# Patient Record
Sex: Male | Born: 1970
Health system: Southern US, Community
[De-identification: ages and names within clinical notes are randomized; demographics above are authoritative.]

## PROBLEM LIST (undated history)

## (undated) DIAGNOSIS — F419 Anxiety disorder, unspecified: Secondary | ICD-10-CM

## (undated) DIAGNOSIS — R61 Generalized hyperhidrosis: Secondary | ICD-10-CM

## (undated) DIAGNOSIS — F909 Attention-deficit hyperactivity disorder, unspecified type: Secondary | ICD-10-CM

## (undated) DIAGNOSIS — K2210 Ulcer of esophagus without bleeding: Secondary | ICD-10-CM

## (undated) DIAGNOSIS — M329 Systemic lupus erythematosus, unspecified: Secondary | ICD-10-CM

## (undated) DIAGNOSIS — M545 Low back pain, unspecified: Secondary | ICD-10-CM

## (undated) DIAGNOSIS — J8410 Pulmonary fibrosis, unspecified: Secondary | ICD-10-CM

## (undated) DIAGNOSIS — G4700 Insomnia, unspecified: Secondary | ICD-10-CM

## (undated) DIAGNOSIS — M8700 Idiopathic aseptic necrosis of unspecified bone: Secondary | ICD-10-CM

## (undated) DIAGNOSIS — F329 Major depressive disorder, single episode, unspecified: Secondary | ICD-10-CM

## (undated) DIAGNOSIS — F32A Depression, unspecified: Secondary | ICD-10-CM

## (undated) DIAGNOSIS — M3500 Sicca syndrome, unspecified: Secondary | ICD-10-CM

## (undated) DIAGNOSIS — J45909 Unspecified asthma, uncomplicated: Secondary | ICD-10-CM

## (undated) DIAGNOSIS — M898X9 Other specified disorders of bone, unspecified site: Secondary | ICD-10-CM

## (undated) DIAGNOSIS — K219 Gastro-esophageal reflux disease without esophagitis: Secondary | ICD-10-CM

## (undated) DIAGNOSIS — N189 Chronic kidney disease, unspecified: Secondary | ICD-10-CM

## (undated) DIAGNOSIS — I319 Disease of pericardium, unspecified: Secondary | ICD-10-CM

## (undated) DIAGNOSIS — I2699 Other pulmonary embolism without acute cor pulmonale: Secondary | ICD-10-CM

## (undated) DIAGNOSIS — I7300 Raynaud's syndrome without gangrene: Secondary | ICD-10-CM

## (undated) DIAGNOSIS — G4730 Sleep apnea, unspecified: Secondary | ICD-10-CM

## (undated) DIAGNOSIS — K449 Diaphragmatic hernia without obstruction or gangrene: Secondary | ICD-10-CM

## (undated) HISTORY — DX: Insomnia, unspecified: G47.00

## (undated) HISTORY — DX: Depression, unspecified: F32.A

## (undated) HISTORY — DX: Major depressive disorder, single episode, unspecified: F32.9

## (undated) HISTORY — DX: Low back pain, unspecified: M54.50

## (undated) HISTORY — DX: Attention-deficit hyperactivity disorder, unspecified type: F90.9

## (undated) HISTORY — DX: Generalized hyperhidrosis: R61

## (undated) HISTORY — DX: Diaphragmatic hernia without obstruction or gangrene: K44.9

## (undated) HISTORY — DX: Ulcer of esophagus without bleeding: K22.10

## (undated) HISTORY — DX: Sleep apnea, unspecified: G47.30

## (undated) HISTORY — DX: Other specified disorders of bone, unspecified site: M89.8X9

## (undated) HISTORY — DX: Gastro-esophageal reflux disease without esophagitis: K21.9

## (undated) HISTORY — PX: VARICOSE VEIN SURGERY: SHX832

## (undated) HISTORY — PX: OTHER SURGICAL HISTORY: SHX169

## (undated) HISTORY — DX: Disease of pericardium, unspecified: I31.9

## (undated) HISTORY — DX: Raynaud's syndrome without gangrene: I73.00

## (undated) HISTORY — DX: Chronic kidney disease, unspecified: N18.9

## (undated) HISTORY — DX: Anxiety disorder, unspecified: F41.9

## (undated) HISTORY — DX: Idiopathic aseptic necrosis of unspecified bone: M87.00

## (undated) HISTORY — DX: Low back pain: M54.5

## (undated) HISTORY — DX: Pulmonary fibrosis, unspecified: J84.10

## (undated) HISTORY — DX: Systemic lupus erythematosus, unspecified: M32.9

## (undated) HISTORY — DX: Sjogren syndrome, unspecified: M35.00

## (undated) HISTORY — DX: Other pulmonary embolism without acute cor pulmonale: I26.99

## (undated) HISTORY — PX: UVULOPALATOPLASTY: SHX2633

## (undated) HISTORY — DX: Unspecified asthma, uncomplicated: J45.909

---

## 1998-05-10 ENCOUNTER — Ambulatory Visit (HOSPITAL_COMMUNITY): Admission: RE | Admit: 1998-05-10 | Discharge: 1998-05-10 | Payer: Self-pay | Admitting: *Deleted

## 1998-08-21 ENCOUNTER — Emergency Department (HOSPITAL_COMMUNITY): Admission: EM | Admit: 1998-08-21 | Discharge: 1998-08-21 | Payer: Self-pay | Admitting: Emergency Medicine

## 1998-09-18 ENCOUNTER — Ambulatory Visit (HOSPITAL_COMMUNITY): Admission: RE | Admit: 1998-09-18 | Discharge: 1998-09-18 | Payer: Self-pay

## 1999-02-12 ENCOUNTER — Inpatient Hospital Stay (HOSPITAL_COMMUNITY): Admission: EM | Admit: 1999-02-12 | Discharge: 1999-03-03 | Payer: Self-pay | Admitting: *Deleted

## 1999-08-25 ENCOUNTER — Encounter: Payer: Self-pay | Admitting: Internal Medicine

## 1999-08-25 ENCOUNTER — Emergency Department (HOSPITAL_COMMUNITY): Admission: EM | Admit: 1999-08-25 | Discharge: 1999-08-25 | Payer: Self-pay | Admitting: Emergency Medicine

## 1999-09-21 ENCOUNTER — Emergency Department (HOSPITAL_COMMUNITY): Admission: EM | Admit: 1999-09-21 | Discharge: 1999-09-21 | Payer: Self-pay | Admitting: *Deleted

## 2002-05-03 ENCOUNTER — Encounter: Payer: Self-pay | Admitting: Emergency Medicine

## 2002-05-03 ENCOUNTER — Inpatient Hospital Stay (HOSPITAL_COMMUNITY): Admission: EM | Admit: 2002-05-03 | Discharge: 2002-05-06 | Payer: Self-pay | Admitting: Emergency Medicine

## 2002-05-23 ENCOUNTER — Encounter: Admission: RE | Admit: 2002-05-23 | Discharge: 2002-08-21 | Payer: Self-pay

## 2002-09-04 ENCOUNTER — Encounter: Admission: RE | Admit: 2002-09-04 | Discharge: 2002-12-03 | Payer: Self-pay | Admitting: Anesthesiology

## 2002-09-23 HISTORY — PX: OTHER SURGICAL HISTORY: SHX169

## 2002-12-07 ENCOUNTER — Encounter: Admission: RE | Admit: 2002-12-07 | Discharge: 2003-03-07 | Payer: Self-pay

## 2003-01-04 ENCOUNTER — Encounter: Payer: Self-pay | Admitting: Emergency Medicine

## 2003-01-04 ENCOUNTER — Inpatient Hospital Stay (HOSPITAL_COMMUNITY): Admission: EM | Admit: 2003-01-04 | Discharge: 2003-01-12 | Payer: Self-pay

## 2003-01-08 ENCOUNTER — Encounter: Payer: Self-pay | Admitting: Pulmonary Disease

## 2003-01-10 ENCOUNTER — Encounter: Payer: Self-pay | Admitting: Internal Medicine

## 2003-02-26 ENCOUNTER — Inpatient Hospital Stay (HOSPITAL_COMMUNITY): Admission: AD | Admit: 2003-02-26 | Discharge: 2003-02-28 | Payer: Self-pay | Admitting: Internal Medicine

## 2003-02-26 ENCOUNTER — Encounter: Payer: Self-pay | Admitting: Internal Medicine

## 2003-03-13 ENCOUNTER — Encounter
Admission: RE | Admit: 2003-03-13 | Discharge: 2003-06-11 | Payer: Self-pay | Admitting: Physical Medicine & Rehabilitation

## 2003-07-18 ENCOUNTER — Encounter
Admission: RE | Admit: 2003-07-18 | Discharge: 2003-10-16 | Payer: Self-pay | Admitting: Physical Medicine & Rehabilitation

## 2003-10-01 ENCOUNTER — Ambulatory Visit (HOSPITAL_COMMUNITY): Admission: RE | Admit: 2003-10-01 | Discharge: 2003-10-02 | Payer: Self-pay | Admitting: Orthopedic Surgery

## 2003-12-27 ENCOUNTER — Encounter: Admission: RE | Admit: 2003-12-27 | Discharge: 2003-12-27 | Payer: Self-pay | Admitting: Orthopedic Surgery

## 2004-04-14 ENCOUNTER — Encounter: Admission: RE | Admit: 2004-04-14 | Discharge: 2004-04-14 | Payer: Self-pay | Admitting: Surgery

## 2004-04-17 ENCOUNTER — Ambulatory Visit (HOSPITAL_COMMUNITY): Admission: RE | Admit: 2004-04-17 | Discharge: 2004-04-17 | Payer: Self-pay | Admitting: Surgery

## 2004-04-17 ENCOUNTER — Encounter (INDEPENDENT_AMBULATORY_CARE_PROVIDER_SITE_OTHER): Payer: Self-pay | Admitting: Specialist

## 2004-05-12 ENCOUNTER — Encounter: Admission: RE | Admit: 2004-05-12 | Discharge: 2004-05-12 | Payer: Self-pay | Admitting: Orthopedic Surgery

## 2004-09-25 ENCOUNTER — Ambulatory Visit: Payer: Self-pay | Admitting: Family Medicine

## 2004-10-01 ENCOUNTER — Ambulatory Visit: Payer: Self-pay | Admitting: Family Medicine

## 2005-01-07 ENCOUNTER — Ambulatory Visit: Payer: Self-pay | Admitting: Family Medicine

## 2005-01-14 ENCOUNTER — Ambulatory Visit: Payer: Self-pay | Admitting: Family Medicine

## 2005-03-20 ENCOUNTER — Emergency Department (HOSPITAL_COMMUNITY): Admission: EM | Admit: 2005-03-20 | Discharge: 2005-03-20 | Payer: Self-pay | Admitting: *Deleted

## 2005-04-14 ENCOUNTER — Ambulatory Visit: Payer: Self-pay | Admitting: Family Medicine

## 2005-04-14 ENCOUNTER — Encounter: Admission: RE | Admit: 2005-04-14 | Discharge: 2005-04-14 | Payer: Self-pay | Admitting: Family Medicine

## 2005-07-06 ENCOUNTER — Ambulatory Visit: Payer: Self-pay | Admitting: Family Medicine

## 2005-07-23 ENCOUNTER — Ambulatory Visit: Payer: Self-pay | Admitting: Internal Medicine

## 2005-07-24 ENCOUNTER — Ambulatory Visit: Payer: Self-pay

## 2005-08-13 ENCOUNTER — Ambulatory Visit: Payer: Self-pay | Admitting: Family Medicine

## 2005-08-26 ENCOUNTER — Ambulatory Visit: Payer: Self-pay | Admitting: Family Medicine

## 2005-09-02 ENCOUNTER — Ambulatory Visit: Payer: Self-pay | Admitting: Family Medicine

## 2005-09-09 ENCOUNTER — Ambulatory Visit: Payer: Self-pay | Admitting: Family Medicine

## 2005-09-25 ENCOUNTER — Ambulatory Visit: Payer: Self-pay | Admitting: Family Medicine

## 2005-10-21 ENCOUNTER — Ambulatory Visit: Payer: Self-pay | Admitting: Gastroenterology

## 2005-11-11 ENCOUNTER — Ambulatory Visit: Payer: Self-pay | Admitting: Family Medicine

## 2005-11-18 ENCOUNTER — Ambulatory Visit: Payer: Self-pay | Admitting: Internal Medicine

## 2005-11-18 ENCOUNTER — Emergency Department (HOSPITAL_COMMUNITY): Admission: EM | Admit: 2005-11-18 | Discharge: 2005-11-18 | Payer: Self-pay | Admitting: Family Medicine

## 2006-01-20 ENCOUNTER — Ambulatory Visit: Payer: Self-pay | Admitting: Family Medicine

## 2006-02-01 ENCOUNTER — Ambulatory Visit: Payer: Self-pay | Admitting: Family Medicine

## 2006-03-10 ENCOUNTER — Ambulatory Visit: Payer: Self-pay | Admitting: Pulmonary Disease

## 2006-03-10 ENCOUNTER — Inpatient Hospital Stay (HOSPITAL_COMMUNITY): Admission: EM | Admit: 2006-03-10 | Discharge: 2006-03-19 | Payer: Self-pay | Admitting: Emergency Medicine

## 2006-03-14 ENCOUNTER — Ambulatory Visit: Payer: Self-pay | Admitting: Internal Medicine

## 2006-03-15 ENCOUNTER — Ambulatory Visit: Payer: Self-pay | Admitting: Internal Medicine

## 2006-03-17 ENCOUNTER — Encounter (INDEPENDENT_AMBULATORY_CARE_PROVIDER_SITE_OTHER): Payer: Self-pay | Admitting: *Deleted

## 2006-03-24 ENCOUNTER — Encounter: Payer: Self-pay | Admitting: Emergency Medicine

## 2006-04-16 ENCOUNTER — Ambulatory Visit: Payer: Self-pay | Admitting: Internal Medicine

## 2006-04-20 ENCOUNTER — Ambulatory Visit: Payer: Self-pay | Admitting: Family Medicine

## 2006-04-27 ENCOUNTER — Ambulatory Visit: Payer: Self-pay | Admitting: Cardiology

## 2006-06-03 ENCOUNTER — Ambulatory Visit: Payer: Self-pay | Admitting: Family Medicine

## 2006-07-07 ENCOUNTER — Ambulatory Visit: Payer: Self-pay | Admitting: Family Medicine

## 2006-07-14 ENCOUNTER — Ambulatory Visit: Payer: Self-pay | Admitting: Family Medicine

## 2006-07-15 ENCOUNTER — Ambulatory Visit: Payer: Self-pay | Admitting: Family Medicine

## 2006-08-06 ENCOUNTER — Ambulatory Visit: Payer: Self-pay | Admitting: Family Medicine

## 2006-12-24 ENCOUNTER — Ambulatory Visit: Payer: Self-pay | Admitting: Internal Medicine

## 2006-12-29 ENCOUNTER — Ambulatory Visit: Payer: Self-pay | Admitting: Family Medicine

## 2007-01-18 ENCOUNTER — Ambulatory Visit (HOSPITAL_BASED_OUTPATIENT_CLINIC_OR_DEPARTMENT_OTHER): Admission: RE | Admit: 2007-01-18 | Discharge: 2007-01-18 | Payer: Self-pay | Admitting: Internal Medicine

## 2007-01-23 ENCOUNTER — Ambulatory Visit: Payer: Self-pay | Admitting: Internal Medicine

## 2007-01-24 ENCOUNTER — Ambulatory Visit: Payer: Self-pay | Admitting: Family Medicine

## 2007-02-17 ENCOUNTER — Ambulatory Visit: Payer: Self-pay | Admitting: Internal Medicine

## 2007-03-21 ENCOUNTER — Observation Stay (HOSPITAL_COMMUNITY): Admission: RE | Admit: 2007-03-21 | Discharge: 2007-03-22 | Payer: Self-pay | Admitting: Otolaryngology

## 2007-03-21 ENCOUNTER — Encounter (INDEPENDENT_AMBULATORY_CARE_PROVIDER_SITE_OTHER): Payer: Self-pay | Admitting: Specialist

## 2007-04-27 DIAGNOSIS — R091 Pleurisy: Secondary | ICD-10-CM

## 2007-04-27 DIAGNOSIS — M3313 Other dermatomyositis without myopathy: Secondary | ICD-10-CM

## 2007-04-27 DIAGNOSIS — I319 Disease of pericardium, unspecified: Secondary | ICD-10-CM

## 2007-04-27 DIAGNOSIS — D72819 Decreased white blood cell count, unspecified: Secondary | ICD-10-CM

## 2007-04-27 DIAGNOSIS — J8410 Pulmonary fibrosis, unspecified: Secondary | ICD-10-CM

## 2007-04-27 DIAGNOSIS — M329 Systemic lupus erythematosus, unspecified: Secondary | ICD-10-CM

## 2007-04-27 DIAGNOSIS — R61 Generalized hyperhidrosis: Secondary | ICD-10-CM

## 2007-04-27 DIAGNOSIS — Z86718 Personal history of other venous thrombosis and embolism: Secondary | ICD-10-CM

## 2007-04-27 DIAGNOSIS — M3390 Dermatopolymyositis, unspecified, organ involvement unspecified: Secondary | ICD-10-CM

## 2007-04-27 HISTORY — DX: Other dermatomyositis without myopathy: M33.13

## 2007-04-27 HISTORY — DX: Dermatopolymyositis, unspecified, organ involvement unspecified: M33.90

## 2007-04-27 HISTORY — DX: Pleurisy: R09.1

## 2007-04-27 HISTORY — DX: Decreased white blood cell count, unspecified: D72.819

## 2007-04-27 HISTORY — DX: Disease of pericardium, unspecified: I31.9

## 2007-04-27 HISTORY — DX: Pulmonary fibrosis, unspecified: J84.10

## 2007-04-27 HISTORY — DX: Systemic lupus erythematosus, unspecified: M32.9

## 2007-04-27 HISTORY — DX: Personal history of other venous thrombosis and embolism: Z86.718

## 2007-04-27 HISTORY — DX: Generalized hyperhidrosis: R61

## 2007-05-05 ENCOUNTER — Ambulatory Visit: Payer: Self-pay | Admitting: Family Medicine

## 2007-05-13 ENCOUNTER — Ambulatory Visit: Payer: Self-pay | Admitting: Family Medicine

## 2007-06-13 ENCOUNTER — Emergency Department (HOSPITAL_COMMUNITY): Admission: EM | Admit: 2007-06-13 | Discharge: 2007-06-13 | Payer: Self-pay | Admitting: Emergency Medicine

## 2007-06-13 HISTORY — PX: OTHER SURGICAL HISTORY: SHX169

## 2007-06-16 ENCOUNTER — Telehealth: Payer: Self-pay | Admitting: *Deleted

## 2007-06-30 ENCOUNTER — Ambulatory Visit: Payer: Self-pay | Admitting: Family Medicine

## 2007-06-30 DIAGNOSIS — F331 Major depressive disorder, recurrent, moderate: Secondary | ICD-10-CM | POA: Insufficient documentation

## 2007-06-30 HISTORY — DX: Major depressive disorder, recurrent, moderate: F33.1

## 2007-07-06 ENCOUNTER — Telehealth: Payer: Self-pay | Admitting: Family Medicine

## 2007-07-18 ENCOUNTER — Telehealth: Payer: Self-pay | Admitting: Family Medicine

## 2007-07-18 ENCOUNTER — Encounter: Payer: Self-pay | Admitting: Family Medicine

## 2007-07-22 ENCOUNTER — Telehealth: Payer: Self-pay | Admitting: Family Medicine

## 2007-07-22 DIAGNOSIS — M549 Dorsalgia, unspecified: Secondary | ICD-10-CM

## 2007-07-22 HISTORY — DX: Dorsalgia, unspecified: M54.9

## 2007-08-17 ENCOUNTER — Telehealth: Payer: Self-pay | Admitting: Family Medicine

## 2007-08-17 ENCOUNTER — Encounter: Admission: RE | Admit: 2007-08-17 | Discharge: 2007-08-17 | Payer: Self-pay | Admitting: Orthopaedic Surgery

## 2007-08-23 ENCOUNTER — Encounter: Payer: Self-pay | Admitting: Family Medicine

## 2007-08-30 ENCOUNTER — Telehealth: Payer: Self-pay | Admitting: Family Medicine

## 2007-09-21 ENCOUNTER — Encounter: Payer: Self-pay | Admitting: Family Medicine

## 2007-09-30 ENCOUNTER — Telehealth: Payer: Self-pay | Admitting: Family Medicine

## 2007-11-10 ENCOUNTER — Encounter: Payer: Self-pay | Admitting: Family Medicine

## 2007-12-12 ENCOUNTER — Telehealth: Payer: Self-pay | Admitting: Family Medicine

## 2007-12-28 ENCOUNTER — Encounter: Payer: Self-pay | Admitting: Family Medicine

## 2007-12-28 ENCOUNTER — Ambulatory Visit (HOSPITAL_COMMUNITY): Admission: RE | Admit: 2007-12-28 | Discharge: 2007-12-29 | Payer: Self-pay | Admitting: Otolaryngology

## 2008-02-22 ENCOUNTER — Telehealth: Payer: Self-pay | Admitting: Family Medicine

## 2008-03-23 ENCOUNTER — Encounter: Payer: Self-pay | Admitting: Family Medicine

## 2008-04-03 ENCOUNTER — Ambulatory Visit: Payer: Self-pay | Admitting: Family Medicine

## 2008-04-03 ENCOUNTER — Telehealth: Payer: Self-pay | Admitting: Family Medicine

## 2008-04-03 DIAGNOSIS — F411 Generalized anxiety disorder: Secondary | ICD-10-CM

## 2008-04-03 DIAGNOSIS — K219 Gastro-esophageal reflux disease without esophagitis: Secondary | ICD-10-CM

## 2008-04-03 DIAGNOSIS — F909 Attention-deficit hyperactivity disorder, unspecified type: Secondary | ICD-10-CM

## 2008-04-03 DIAGNOSIS — J0120 Acute ethmoidal sinusitis, unspecified: Secondary | ICD-10-CM

## 2008-04-03 HISTORY — DX: Acute ethmoidal sinusitis, unspecified: J01.20

## 2008-04-03 HISTORY — DX: Gastro-esophageal reflux disease without esophagitis: K21.9

## 2008-04-03 HISTORY — DX: Generalized anxiety disorder: F41.1

## 2008-04-03 HISTORY — DX: Attention-deficit hyperactivity disorder, unspecified type: F90.9

## 2008-04-17 ENCOUNTER — Telehealth: Payer: Self-pay | Admitting: Family Medicine

## 2008-04-21 ENCOUNTER — Encounter: Payer: Self-pay | Admitting: Family Medicine
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
## 2008-05-09 ENCOUNTER — Telehealth: Payer: Self-pay | Admitting: Family Medicine

## 2008-05-10 ENCOUNTER — Telehealth: Payer: Self-pay | Admitting: Family Medicine

## 2008-05-11 ENCOUNTER — Encounter: Payer: Self-pay | Admitting: Family Medicine

## 2008-05-23 ENCOUNTER — Telehealth: Payer: Self-pay | Admitting: Family Medicine

## 2008-08-16 ENCOUNTER — Telehealth: Payer: Self-pay | Admitting: Family Medicine

## 2008-08-24 ENCOUNTER — Ambulatory Visit: Payer: Self-pay | Admitting: Family Medicine

## 2008-09-05 ENCOUNTER — Ambulatory Visit: Payer: Self-pay | Admitting: Family Medicine

## 2008-09-06 ENCOUNTER — Telehealth: Payer: Self-pay | Admitting: Family Medicine

## 2008-09-28 ENCOUNTER — Ambulatory Visit: Payer: Self-pay | Admitting: Family Medicine

## 2008-10-23 ENCOUNTER — Telehealth: Payer: Self-pay | Admitting: Family Medicine

## 2008-10-29 ENCOUNTER — Telehealth: Payer: Self-pay | Admitting: Family Medicine

## 2008-10-30 ENCOUNTER — Ambulatory Visit: Payer: Self-pay | Admitting: Family Medicine

## 2008-11-09 ENCOUNTER — Telehealth: Payer: Self-pay | Admitting: Family Medicine

## 2009-02-05 ENCOUNTER — Telehealth: Payer: Self-pay | Admitting: Family Medicine

## 2009-02-08 ENCOUNTER — Encounter: Payer: Self-pay | Admitting: Family Medicine

## 2009-03-12 ENCOUNTER — Encounter: Payer: Self-pay | Admitting: Endocrinology

## 2009-03-26 ENCOUNTER — Ambulatory Visit: Payer: Self-pay | Admitting: Endocrinology

## 2009-03-26 DIAGNOSIS — E291 Testicular hypofunction: Secondary | ICD-10-CM

## 2009-03-26 DIAGNOSIS — E230 Hypopituitarism: Secondary | ICD-10-CM

## 2009-03-26 HISTORY — DX: Testicular hypofunction: E29.1

## 2009-03-26 HISTORY — DX: Hypopituitarism: E23.0

## 2009-03-27 LAB — CONVERTED CEMR LAB
BUN: 12 mg/dL (ref 6–23)
CO2: 34 meq/L — ABNORMAL HIGH (ref 19–32)
Calcium: 8.5 mg/dL (ref 8.4–10.5)
Chloride: 104 meq/L (ref 96–112)
Cortisol, Plasma: 1.3 ug/dL
Creatinine, Ser: 1 mg/dL (ref 0.4–1.5)
FSH: 2.5 milliintl units/mL (ref 1.4–18.1)
Free T4: 0.9 ng/dL (ref 0.6–1.6)
GFR calc non Af Amer: 88.59 mL/min (ref >60)
Glucose, Bld: 85 mg/dL (ref 70–99)
LH: 0.48 milliintl units/mL — ABNORMAL LOW (ref 1.50–9.30)
Potassium: 4.2 meq/L (ref 3.5–5.1)
Prolactin: 6.7 ng/mL
Sodium: 142 meq/L (ref 135–145)
TSH: 1.77 microintl units/mL (ref 0.35–5.50)
Testosterone: 98.5 ng/dL — ABNORMAL LOW (ref 350.00–890.00)

## 2009-04-02 ENCOUNTER — Ambulatory Visit: Payer: Self-pay | Admitting: Endocrinology

## 2009-04-10 ENCOUNTER — Telehealth (INDEPENDENT_AMBULATORY_CARE_PROVIDER_SITE_OTHER): Payer: Self-pay | Admitting: *Deleted

## 2009-04-11 ENCOUNTER — Telehealth (INDEPENDENT_AMBULATORY_CARE_PROVIDER_SITE_OTHER): Payer: Self-pay | Admitting: *Deleted

## 2009-04-11 ENCOUNTER — Telehealth: Payer: Self-pay | Admitting: Family Medicine

## 2009-04-16 ENCOUNTER — Ambulatory Visit: Payer: Self-pay | Admitting: Family Medicine

## 2009-05-03 ENCOUNTER — Telehealth: Payer: Self-pay | Admitting: Family Medicine

## 2009-05-14 ENCOUNTER — Ambulatory Visit: Payer: Self-pay | Admitting: Endocrinology

## 2009-05-14 LAB — CONVERTED CEMR LAB: Testosterone: 365.08 ng/dL (ref 350.00–890.00)

## 2009-05-24 ENCOUNTER — Telehealth: Payer: Self-pay | Admitting: Family Medicine

## 2009-05-27 ENCOUNTER — Telehealth: Payer: Self-pay | Admitting: Family Medicine

## 2009-05-28 ENCOUNTER — Ambulatory Visit: Payer: Self-pay | Admitting: Family Medicine

## 2009-05-28 DIAGNOSIS — I7300 Raynaud's syndrome without gangrene: Secondary | ICD-10-CM | POA: Insufficient documentation

## 2009-05-28 HISTORY — DX: Raynaud's syndrome without gangrene: I73.00

## 2009-06-04 ENCOUNTER — Emergency Department (HOSPITAL_BASED_OUTPATIENT_CLINIC_OR_DEPARTMENT_OTHER): Admission: EM | Admit: 2009-06-04 | Discharge: 2009-06-04 | Payer: Self-pay | Admitting: Emergency Medicine

## 2009-06-05 ENCOUNTER — Encounter: Payer: Self-pay | Admitting: Family Medicine

## 2009-06-17 ENCOUNTER — Telehealth: Payer: Self-pay | Admitting: Family Medicine

## 2009-07-09 ENCOUNTER — Ambulatory Visit: Payer: Self-pay | Admitting: Family Medicine

## 2009-08-19 ENCOUNTER — Ambulatory Visit: Payer: Self-pay | Admitting: Family Medicine

## 2009-08-24 ENCOUNTER — Ambulatory Visit: Payer: Self-pay | Admitting: Diagnostic Radiology

## 2009-08-24 ENCOUNTER — Inpatient Hospital Stay (HOSPITAL_COMMUNITY): Admission: EM | Admit: 2009-08-24 | Discharge: 2009-08-26 | Payer: Self-pay | Admitting: Emergency Medicine

## 2009-08-24 ENCOUNTER — Encounter: Payer: Self-pay | Admitting: Emergency Medicine

## 2009-08-30 ENCOUNTER — Telehealth: Payer: Self-pay | Admitting: Family Medicine

## 2009-09-11 ENCOUNTER — Telehealth: Payer: Self-pay | Admitting: Family Medicine

## 2009-10-01 ENCOUNTER — Ambulatory Visit: Payer: Self-pay | Admitting: Family Medicine

## 2009-10-04 ENCOUNTER — Ambulatory Visit: Payer: Self-pay | Admitting: Family Medicine

## 2009-10-23 ENCOUNTER — Ambulatory Visit: Payer: Self-pay | Admitting: Family Medicine

## 2009-10-25 ENCOUNTER — Telehealth: Payer: Self-pay | Admitting: Family Medicine

## 2009-10-28 ENCOUNTER — Ambulatory Visit: Payer: Self-pay | Admitting: Family Medicine

## 2009-10-29 ENCOUNTER — Telehealth: Payer: Self-pay | Admitting: Family Medicine

## 2009-10-30 ENCOUNTER — Telehealth: Payer: Self-pay | Admitting: Family Medicine

## 2009-11-08 ENCOUNTER — Ambulatory Visit: Payer: Self-pay | Admitting: Family Medicine

## 2009-12-02 ENCOUNTER — Telehealth: Payer: Self-pay | Admitting: Family Medicine

## 2009-12-25 ENCOUNTER — Ambulatory Visit: Payer: Self-pay | Admitting: Endocrinology

## 2009-12-26 ENCOUNTER — Telehealth: Payer: Self-pay | Admitting: Endocrinology

## 2009-12-26 ENCOUNTER — Ambulatory Visit: Payer: Self-pay | Admitting: Endocrinology

## 2009-12-27 ENCOUNTER — Encounter: Payer: Self-pay | Admitting: Endocrinology

## 2010-01-10 ENCOUNTER — Ambulatory Visit: Payer: Self-pay | Admitting: Family Medicine

## 2010-01-10 LAB — CONVERTED CEMR LAB
Bilirubin Urine: NEGATIVE
Blood in Urine, dipstick: NEGATIVE
Glucose, Urine, Semiquant: NEGATIVE
Ketones, urine, test strip: NEGATIVE
Nitrite: NEGATIVE
Protein, U semiquant: NEGATIVE
Specific Gravity, Urine: 1.015
Urobilinogen, UA: 0.2
WBC Urine, dipstick: NEGATIVE
pH: 5

## 2010-01-12 ENCOUNTER — Emergency Department (HOSPITAL_COMMUNITY): Admission: EM | Admit: 2010-01-12 | Discharge: 2010-01-13 | Payer: Self-pay | Admitting: Emergency Medicine

## 2010-01-13 ENCOUNTER — Telehealth: Payer: Self-pay | Admitting: Family Medicine

## 2010-01-13 ENCOUNTER — Telehealth (INDEPENDENT_AMBULATORY_CARE_PROVIDER_SITE_OTHER): Payer: Self-pay | Admitting: *Deleted

## 2010-01-22 ENCOUNTER — Ambulatory Visit: Payer: Self-pay | Admitting: Endocrinology

## 2010-02-24 ENCOUNTER — Telehealth: Payer: Self-pay | Admitting: Family Medicine

## 2010-06-13 ENCOUNTER — Encounter: Payer: Self-pay | Admitting: Family Medicine

## 2010-06-16 ENCOUNTER — Ambulatory Visit: Payer: Self-pay | Admitting: Family Medicine

## 2010-06-17 ENCOUNTER — Telehealth: Payer: Self-pay | Admitting: Family Medicine

## 2010-06-18 ENCOUNTER — Ambulatory Visit: Payer: Self-pay | Admitting: Family Medicine

## 2010-06-30 ENCOUNTER — Telehealth: Payer: Self-pay | Admitting: Family Medicine

## 2010-08-11 ENCOUNTER — Ambulatory Visit: Payer: Self-pay | Admitting: Family Medicine

## 2010-08-13 ENCOUNTER — Ambulatory Visit: Payer: Self-pay | Admitting: Family Medicine

## 2010-08-22 ENCOUNTER — Ambulatory Visit: Payer: Self-pay | Admitting: Family Medicine

## 2010-09-03 ENCOUNTER — Telehealth: Payer: Self-pay | Admitting: Family Medicine

## 2010-10-01 ENCOUNTER — Ambulatory Visit: Payer: Self-pay | Admitting: Family Medicine

## 2010-10-01 ENCOUNTER — Ambulatory Visit: Payer: Self-pay | Admitting: Cardiovascular Disease

## 2010-10-01 DIAGNOSIS — R519 Headache, unspecified: Secondary | ICD-10-CM

## 2010-10-01 DIAGNOSIS — R51 Headache: Secondary | ICD-10-CM

## 2010-10-01 HISTORY — DX: Headache, unspecified: R51.9

## 2010-10-01 HISTORY — DX: Headache: R51

## 2010-12-05 ENCOUNTER — Ambulatory Visit
Admission: RE | Admit: 2010-12-05 | Discharge: 2010-12-05 | Payer: Self-pay | Source: Home / Self Care | Attending: Family Medicine | Admitting: Family Medicine

## 2010-12-13 ENCOUNTER — Encounter: Payer: Self-pay | Admitting: Gastroenterology

## 2010-12-14 ENCOUNTER — Encounter: Payer: Self-pay | Admitting: Orthopedic Surgery

## 2010-12-23 NOTE — Assessment & Plan Note (Signed)
Summary: rosefin shot//ccm   Nurse Visit   Allergies: 1)  Sulfamethoxazole (Sulfamethoxazole) 2)  Bactrim (Sulfamethoxazole-Trimethoprim)  Medication Administration  Injection # 1:    Medication: Rocephin  250mg     Diagnosis: PNEUMONIA (ICD-486)    Route: IM    Site: RUOQ gluteus    Exp Date: 4/13    Lot #: [id number]    Mfr: [place]    Comments: 1g    Patient tolerated injection without complications    Given by: Alfred Levins, CMA (October 28, 2009 2:06 PM)  Orders Added: 1)  Rocephin  250mg  [J0696] 2)  Admin of Therapeutic Inj  intramuscular or subcutaneous [96372]  Appended Document: rosefin shot//ccm actually did Left gluteus cause pt wanted it in his left

## 2010-12-23 NOTE — Medication Information (Signed)
Summary: Prior Autho & Approved for Depo Testosterone/[facility]  Prior Autho [employer] Approved for Depo Testosterone/[facility]   Imported By: Sherian Rein 12/30/2009 11:01:41  _____________________________________________________________________  External Attachment:    Type:   Image     Comment:   External Document
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
## 2010-12-23 NOTE — Letter (Signed)
Summary: Dr. Noel Gerold note  Dr. Noel Gerold note   Imported By: Kassie Mends 08/29/2007 09:00:02  _____________________________________________________________________  External Attachment:    Type:   Image     Comment:   Dr. Noel Gerold
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
## 2010-12-23 NOTE — Assessment & Plan Note (Signed)
Summary: flu shot/jls  Flu Vaccine Consent Questions     Do you have a history of severe allergic reactions to this vaccine? no    Any prior history of allergic reactions to egg and/or gelatin? no    Do you have a sensitivity to the preservative Thimersol? no    Do you have a past history of Guillan-Barre Syndrome? no    Do you currently have an acute febrile illness? no    Have you ever had a severe reaction to latex? no    Vaccine information given and explained to patient? yes    Are you currently pregnant? no    Lot Number:[id number]   Site Given  Left Deltoid IM  Nurse Visit    Prior Medications: ALPRAZOLAM 1 MG TABS (ALPRAZOLAM) 5 times a day AMBIEN 10 MG TABS (ZOLPIDEM TARTRATE) Take 1 tablet by mouth at bedtime CALCIUM 500 500 MG TABS (CALCIUM CARBONATE) Take 1 tablet by mouth twice a day CELLCEPT 500 MG TABS (MYCOPHENOLATE MOFETIL) Take 2 tablet by mouth twice a day DILAUDID 8 MG TABS (HYDROMORPHONE HCL) Take one qid NORVASC 10 MG TABS (AMLODIPINE BESYLATE) Take 1 tablet by mouth once a day OMEPRAZOLE 20 MG CPDR (OMEPRAZOLE)  PREDNISONE INTENSOL 5 MG/ML  CONC (PREDNISONE) 15 mg. daily PROMETHAZINE HCL 25 MG TABS (PROMETHAZINE HCL)  VITAMIN D3 1000 UNIT TABS (CHOLECALCIFEROL) Take 1 tablet once a day ADDERALL 30 MG  TABS (AMPHETAMINE-DEXTROAMPHETAMINE) three times a day CLONIDINE HCL 0.1 MG TABS (CLONIDINE HCL) pump TAMIFLU 75 MG  CAPS (OSELTAMIVIR PHOSPHATE) one tab by mouth two times a day Current Allergies: SULFAMETHOXAZOLE (SULFAMETHOXAZOLE) BACTRIM (SULFAMETHOXAZOLE-TRIMETHOPRIM)    Orders Added: 1)  Flu Vaccine 26yrs + [16109] 2)  Administration Flu vaccine [G0008]    ]

## 2010-12-23 NOTE — Progress Notes (Signed)
Summary: refill  Medications Added ADDERALL 30 MG  TABS (AMPHETAMINE-DEXTROAMPHETAMINE) three times a day ADDERALL 30 MG  TABS (AMPHETAMINE-DEXTROAMPHETAMINE) three times a day, may fill on 01-12-08 ADDERALL 30 MG  TABS (AMPHETAMINE-DEXTROAMPHETAMINE) three times a day, may fill on 02-09-08       Phone Note Call from Patient Call back at [phone]   Caller: patient triage message Call For: Clent [place]  Summary of Call: refill adderall 30mg  1 3 times a day.   Initial call taken by: Roselle Locus,  December 12, 2007 7:53 AM  Follow-up for Phone Call        done for 3 months Follow-up by: Nelwyn Salisbury MD,  December 12, 2007 8:31 AM  Additional Follow-up for Phone Call Additional follow up Details #1::        Phone Call Completed Additional Follow-up by: Alfred Levins, CMA,  December 12, 2007 1:12 PM    New/Updated Medications: ADDERALL 30 MG  TABS (AMPHETAMINE-DEXTROAMPHETAMINE) three times a day ADDERALL 30 MG  TABS (AMPHETAMINE-DEXTROAMPHETAMINE) three times a day, may fill on 01-12-08 ADDERALL 30 MG  TABS (AMPHETAMINE-DEXTROAMPHETAMINE) three times a day, may fill on 02-09-08   Prescriptions: ADDERALL 30 MG  TABS (AMPHETAMINE-DEXTROAMPHETAMINE) three times a day, may fill on 02-09-08  #30 x 0   Entered and Authorized by:   Nelwyn Salisbury MD   Signed by:   Nelwyn Salisbury MD on 12/12/2007   Method used:   Print then Give to Patient   RxID:   [id number] ADDERALL 30 MG  TABS (AMPHETAMINE-DEXTROAMPHETAMINE) three times a day, may fill on 01-12-08  #30 x 0   Entered and Authorized by:   Nelwyn Salisbury MD   Signed by:   Nelwyn Salisbury MD on 12/12/2007   Method used:   Print then Give to Patient   RxID:   [id number] ADDERALL 30 MG  TABS (AMPHETAMINE-DEXTROAMPHETAMINE) three times a day  #30 x 0   Entered and Authorized by:   Nelwyn Salisbury MD   Signed by:   Nelwyn Salisbury MD on 12/12/2007   Method used:   Print then Give to Patient   RxID:   [id number]

## 2010-12-23 NOTE — Progress Notes (Signed)
  Phone Note Call from Patient Call back at [facility] Phone [phone]   Caller: Patient Call For: ELLISON Summary of Call: Pt states he needs labs results, he cannot retrieve from phone tree. Pt would like a nurse to call him back. Initial call taken by: Verdell Face,  Apr 10, 2009 1:41 PM  Follow-up for Phone Call        called pt to inform labs are on the phone tree just tried it Follow-up by: Shelbie Proctor,  Apr 11, 2009 8:59 AM  Additional Follow-up for Phone Call Additional follow up Details #1::         could not reach pt by phone to inform labs are on the phone tree  Additional Follow-up by: Shelbie Proctor,  Apr 11, 2009 9:04 AM

## 2010-12-23 NOTE — Progress Notes (Signed)
Summary: please call in something feels worse today   Phone Note Call from Patient Call back at [phone]   Caller: pt live Call For: Clent Ridges  Summary of Call: He was in yesterday to see Clent Ridges.  He is worse today.  He has laringitys and sore throat and some chest congestion and awful cough.  Will you call him something in to [facility]. [address]  Tussinex please  He will back off his oral meds  Initial call taken by: Roselle Locus,  September 06, 2008 12:03 PM  Follow-up for Phone Call        call in Tussionex, one tsp every 6 hours as needed cough, 240 ml with no rf Follow-up by: Nelwyn Salisbury MD,  September 06, 2008 12:58 PM  Additional Follow-up for Phone Call Additional follow up Details #1::        tussionex  [facility] to [place]  directions per dr fry changed to 1 tsp two times a day  Additional Follow-up by: Pura Spice, RN,  September 06, 2008 1:02 PM    Additional Follow-up for Phone Call Additional follow up Details #2::    agreed Follow-up by: Nelwyn Salisbury MD,  September 06, 2008 2:45 PM

## 2010-12-23 NOTE — Progress Notes (Signed)
Summary: refill xanax  Phone Note From Pharmacy   Caller: [facility]  [address] # [id number]* Call For: fry  Summary of Call: refill xanax 1 by mouth 5 times daily Initial call taken by: Alfred Levins, CMA,  May 03, 2009 4:00 PM  Follow-up for Phone Call        call in #150 with 5 rf Follow-up by: Nelwyn Salisbury MD,  May 03, 2009 4:24 PM  Additional Follow-up for Phone Call Additional follow up Details #1::        Phone call completed, Pharmacist called Additional Follow-up by: Alfred Levins, CMA,  May 03, 2009 4:41 PM      Prescriptions: ALPRAZOLAM 1 MG TABS (ALPRAZOLAM) 5 times a day  #150 x 5   Entered by:   Alfred Levins, CMA   Authorized by:   Nelwyn Salisbury MD   Signed by:   Alfred Levins, CMA on 05/03/2009   Method used:   Telephoned to ...       [facility]  [address] # [id number]* (retail)       [address]       [place], [place]  [address]       Ph: [phone] or [phone]       Fax: [phone]   RxID:   [id number]

## 2010-12-23 NOTE — Progress Notes (Signed)
Summary: refill  Phone Note Refill Request Call back at [facility] Phone [phone] Call back at [phone]  Message from:  [name]---live call  Refills Requested: Medication #1:  ADDERALL 30 MG  TABS three times a day call when ready. need today.  Initial call taken by: Warnell Forester,  February 24, 2010 1:51 PM  Follow-up for Phone Call        done Follow-up by: Nelwyn Salisbury MD,  February 24, 2010 4:21 PM  Additional Follow-up for Phone Call Additional follow up Details #1::        ready for pick up - pt here. KIK Additional Follow-up by: Duard Brady LPN,  February 24, 2010 5:07 PM    New/Updated Medications: ADDERALL 30 MG  TABS (AMPHETAMINE-DEXTROAMPHETAMINE) three times a day ADDERALL 30 MG  TABS (AMPHETAMINE-DEXTROAMPHETAMINE) three times a day, may fill on 03-26-10 ADDERALL 30 MG  TABS (AMPHETAMINE-DEXTROAMPHETAMINE) three times a day, may fill on 04-26-10 ADDERALL 30 MG  TABS (AMPHETAMINE-DEXTROAMPHETAMINE) three times a day ADDERALL 30 MG  TABS (AMPHETAMINE-DEXTROAMPHETAMINE) three times a day, may fill on 03-26-10 ADDERALL 30 MG  TABS (AMPHETAMINE-DEXTROAMPHETAMINE) three times a day, may fill on 04-26-10 Prescriptions: ADDERALL 30 MG  TABS (AMPHETAMINE-DEXTROAMPHETAMINE) three times a day, may fill on 04-26-10  #90 x 0   Entered and Authorized by:   Nelwyn Salisbury MD   Signed by:   Nelwyn Salisbury MD on 02/24/2010   Method used:   Print then Give to [name]   RxID:   [id number] ADDERALL 30 MG  TABS (AMPHETAMINE-DEXTROAMPHETAMINE) three times a day, may fill on 03-26-10  #90 x 0   Entered and Authorized by:   Nelwyn Salisbury MD   Signed by:   Nelwyn Salisbury MD on 02/24/2010   Method used:   Print then Give to [name]   RxID:   [id number] ADDERALL 30 MG  TABS (AMPHETAMINE-DEXTROAMPHETAMINE) three times a day  #90 x 0   Entered and Authorized by:   Nelwyn Salisbury MD   Signed by:   Nelwyn Salisbury MD on 02/24/2010   Method used:   Print then Give to [name]   RxID:    [id number] ADDERALL 30 MG  TABS (AMPHETAMINE-DEXTROAMPHETAMINE) three times a day, may fill on 04-26-10  #90 x 0   Entered by:   Nelwyn Salisbury MD   Authorized by:   Raechel Ache, RN   Signed by:   Nelwyn Salisbury MD on 02/24/2010   Method used:   Print then Give to [name]   RxID:   [id number] ADDERALL 30 MG  TABS (AMPHETAMINE-DEXTROAMPHETAMINE) three times a day, may fill on 03-26-10  #90 x 0   Entered by:   Nelwyn Salisbury MD   Authorized by:   Raechel Ache, RN   Signed by:   Nelwyn Salisbury MD on 02/24/2010   Method used:   Print then Give to [name]   RxID:   [id number] ADDERALL 30 MG  TABS (AMPHETAMINE-DEXTROAMPHETAMINE) three times a day  #90 x 0   Entered by:   Nelwyn Salisbury MD   Authorized by:   Raechel Ache, RN   Signed by:   Nelwyn Salisbury MD on 02/24/2010   Method used:   Print then Give to [name]   RxID:   [id number]

## 2010-12-23 NOTE — Progress Notes (Signed)
Summary: refill alprazolam  Phone Note Refill Request Message from:  Pharmacy on October 23, 2008 12:47 PM  Refills Requested: Medication #1:  ALPRAZOLAM 1 MG TABS 5 times a day  Method Requested: Electronic Initial call taken by: Alfred Levins, CMA,  October 23, 2008 12:47 PM  Follow-up for Phone Call        call in #150 with 5 rf Follow-up by: Nelwyn Salisbury MD,  October 23, 2008 1:00 PM  Additional Follow-up for Phone Call Additional follow up Details #1::        Rx called to pharmacy Additional Follow-up by: Alfred Levins, CMA,  October 23, 2008 4:39 PM      Prescriptions: ALPRAZOLAM 1 MG TABS (ALPRAZOLAM) 5 times a day  #150 x 5   Entered by:   Alfred Levins, CMA   Authorized by:   Nelwyn Salisbury MD   Signed by:   Alfred Levins, CMA on 10/23/2008   Method used:   Telephoned to ...       [facility]  [address] # [id number]* (retail)       [address]       [place], [place]  [address]       Ph: [phone] or [phone]       Fax: [phone]   RxID:   [id number]

## 2010-12-23 NOTE — Letter (Signed)
Summary: Discharge Instructions, Medication List/Medical [facility]  Discharge Instructions, Medication List/Medical [facility]   Imported By: Maryln Gottron 06/13/2009 09:39:00  _____________________________________________________________________  External Attachment:    Type:   Image     Comment:   External Document
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
## 2010-12-23 NOTE — Letter (Signed)
Summary: [facility]  [facility]   Imported By: Maryln Gottron 06/30/2010 14:39:58  _____________________________________________________________________  External Attachment:    Type:   Image     Comment:   External Document

## 2010-12-23 NOTE — Letter (Signed)
Summary: Application for Handicapped Placard  Application for Handicapped Placard   Imported By: Maryln Gottron 02/13/2009 14:02:10  _____________________________________________________________________  External Attachment:    Type:   Image     Comment:   External Document

## 2010-12-23 NOTE — Progress Notes (Signed)
Summary: REFILL  Phone Note Call from Patient Call back at [phone]   Caller: PT LIVE Call For: FRY  Summary of Call: ADDERALL 30 MG 3 TIMES PER DAY  Initial call taken by: Roselle Locus,  October 29, 2008 9:25 AM  Follow-up for Phone Call        done Follow-up by: Nelwyn Salisbury MD,  October 29, 2008 1:11 PM  Additional Follow-up for Phone Call Additional follow up Details #1::        rx up front, pt aware Additional Follow-up by: Alfred Levins, CMA,  October 29, 2008 4:00 PM      Prescriptions: ADDERALL 30 MG  TABS (AMPHETAMINE-DEXTROAMPHETAMINE) three times a day  #90 x 0   Entered and Authorized by:   Nelwyn Salisbury MD   Signed by:   Nelwyn Salisbury MD on 10/29/2008   Method used:   Print then Give to Patient   RxID:   [id number]

## 2010-12-23 NOTE — Progress Notes (Signed)
Summary: refill zolpidem  Phone Note From Pharmacy   Caller: [facility]  [address] # [id number]* Call For: fry  Summary of Call: redill zolpidem 10mg  1 by mouth at bedtime  Initial call taken by: Alfred Levins, CMA,  May 24, 2009 3:42 PM  Follow-up for Phone Call        call in #30 with 5 rf Follow-up by: Nelwyn Salisbury MD,  May 24, 2009 4:56 PM  Additional Follow-up for Phone Call Additional follow up Details #1::        Phone call completed, Pharmacist called Additional Follow-up by: Alfred Levins, CMA,  May 24, 2009 4:57 PM      Prescriptions: ALPRAZOLAM 1 MG TABS (ALPRAZOLAM) 5 times a day  #30 x 5   Entered by:   Alfred Levins, CMA   Authorized by:   Nelwyn Salisbury MD   Signed by:   Alfred Levins, CMA on 05/24/2009   Method used:   Telephoned to ...       [facility]  [address] # [id number]* (retail)       [address]       [place], [place]  [address]       Ph: [phone] or [phone]       Fax: [phone]   RxID:   [id number]
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
## 2010-12-23 NOTE — Assessment & Plan Note (Signed)
Summary: rocephin inj/njr   Nurse Visit   Allergies: 1)  Sulfamethoxazole (Sulfamethoxazole) 2)  Bactrim (Sulfamethoxazole-Trimethoprim)  Medication Administration  Injection # 1:    Medication: Rocephin  250mg     Diagnosis: ACUTE BRONCHITIS (ICD-466.0)    Route: IM    Site: LUOQ gluteus    Exp Date: 11/13    Lot #: [id number]    Mfr: novaplus    Comments: 1 gram given    Patient tolerated injection without complications    Given by: Raechel Ache, RN (June 18, 2010 10:53 AM)  Orders Added: 1)  Rocephin  250mg  [J0696] 2)  Admin of Therapeutic Inj  intramuscular or subcutaneous [29562]

## 2010-12-23 NOTE — Progress Notes (Signed)
Summary: [name]  Phone Note Call from Patient Call back at Home Phone [phone] Call back at [phone]   Caller: Patient Call For: Nelwyn Salisbury MD Summary of Call: pt would like another rocephine inj tomorrow. Can I SCH? Initial call taken by: Heron Sabins,  June 17, 2010 1:06 PM  Follow-up for Phone Call        yes set this up with Darel Hong Follow-up by: Nelwyn Salisbury MD,  June 17, 2010 4:42 PM  Additional Follow-up for Phone Call Additional follow up Details #1::        [facility] Additional Follow-up by: Heron Sabins,  June 17, 2010 4:52 PM    Additional Follow-up for Phone Call Additional follow up Details #2::    inj 06-18-2010 10.15am Follow-up by: Heron Sabins,  June 18, 2010 9:22 AM

## 2010-12-23 NOTE — Progress Notes (Signed)
Summary: REFILL  Phone Note Call from Patient Call back at Home Phone [phone]   Caller: Patient-LIVE CALL Reason for Call: Refill Medication Summary of Call: RF ADDERALL. CALL PT. Initial call taken by: Warnell Forester,  September 11, 2009 12:56 PM  Follow-up for Phone Call        done Follow-up by: Nelwyn Salisbury MD,  September 11, 2009 2:29 PM  Additional Follow-up for Phone Call Additional follow up Details #1::        rx up front, pt aware Additional Follow-up by: Alfred Levins, CMA,  September 11, 2009 2:37 PM    New/Updated Medications: ADDERALL 30 MG  TABS (AMPHETAMINE-DEXTROAMPHETAMINE) three times a day ADDERALL 30 MG  TABS (AMPHETAMINE-DEXTROAMPHETAMINE) three times a day, may fill on 10-12-09 ADDERALL 30 MG  TABS (AMPHETAMINE-DEXTROAMPHETAMINE) three times a day, may fill on 11-11-09 Prescriptions: ADDERALL 30 MG  TABS (AMPHETAMINE-DEXTROAMPHETAMINE) three times a day, may fill on 11-11-09  #90 x 0   Entered and Authorized by:   Nelwyn Salisbury MD   Signed by:   Nelwyn Salisbury MD on 09/11/2009   Method used:   Print then Give to Patient   RxID:   [id number] ADDERALL 30 MG  TABS (AMPHETAMINE-DEXTROAMPHETAMINE) three times a day, may fill on 10-12-09  #90 x 0   Entered and Authorized by:   Nelwyn Salisbury MD   Signed by:   Nelwyn Salisbury MD on 09/11/2009   Method used:   Print then Give to Patient   RxID:   [id number] ADDERALL 30 MG  TABS (AMPHETAMINE-DEXTROAMPHETAMINE) three times a day  #90 x 0   Entered and Authorized by:   Nelwyn Salisbury MD   Signed by:   Nelwyn Salisbury MD on 09/11/2009   Method used:   Print then Give to Patient   RxID:   [id number]

## 2010-12-23 NOTE — Progress Notes (Signed)
Summary: FYI  Phone Note Call from Patient Call back at [facility] Phone [phone]   Caller: Patient Call For: dr fry Summary of Call: fyi pt will fup with pain clinic for his back problems. He will keep dr fry posted Initial call taken by: Heron Sabins,  July 18, 2007 10:14 AM  Follow-up for Phone Call        noted Follow-up by: Nelwyn Salisbury MD,  July 18, 2007 11:47 AM
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
## 2010-12-23 NOTE — Progress Notes (Signed)
Summary: new rx  Phone Note Call from Patient Call back at Home Phone [phone] Call back at [phone]   Caller: Patient Call For: dr Laquita Harlan Summary of Call: pt would like a new rx for adderall 30 mg #90 up to three times a day. pt would like month supply  Initial call taken by: Heron Sabins,  August 16, 2008 4:08 PM  Follow-up for Phone Call        done Follow-up by: Nelwyn Salisbury MD,  August 17, 2008 9:21 AM  Additional Follow-up for Phone Call Additional follow up Details #1::        rx up front, pt aware Additional Follow-up by: Alfred Levins, CMA,  August 17, 2008 11:00 AM    New/Updated Medications: ADDERALL 30 MG  TABS (AMPHETAMINE-DEXTROAMPHETAMINE) three times a day   Prescriptions: ADDERALL 30 MG  TABS (AMPHETAMINE-DEXTROAMPHETAMINE) three times a day  #90 x 0   Entered and Authorized by:   Nelwyn Salisbury MD   Signed by:   Nelwyn Salisbury MD on 08/17/2008   Method used:   Print then Give to Patient   RxID:   [id number]

## 2010-12-23 NOTE — Assessment & Plan Note (Signed)
Summary: shot//ccm rocephin   Nurse Visit   Allergies: 1)  Sulfamethoxazole (Sulfamethoxazole) 2)  Bactrim (Sulfamethoxazole-Trimethoprim)  Medication Administration  Injection # 1:    Medication: Rocephin  250mg     Diagnosis: ACUTE SINUSITIS, UNSPECIFIED (ICD-461.9)    Route: IM    Site: LUOQ gluteus    Exp Date: 03/2013    Lot #: [id number]    Comments: 1 gram given     Patient tolerated injection without complications    Given by: Pura Spice, RN (August 13, 2010 2:38 PM)  Orders Added: 1)  Rocephin  250mg  [Q0086]

## 2010-12-23 NOTE — Progress Notes (Signed)
Summary: needs another urine test  Phone Note Call from Patient Call back at [phone]   Caller: patient live Call For: [name] Summary of Call: Patient wants to know if DR Close faxed over orders to have another urine test .  He is still having uti.  He would like something called in for this uti.  Patient has made appt for 6-19. Initial call taken by: Celine Ahr,  May 09, 2008 12:42 PM  Follow-up for Phone Call        this was taken care of on another phone note Follow-up by: Nelwyn Salisbury MD,  May 09, 2008 4:23 PM
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
## 2010-12-23 NOTE — Progress Notes (Signed)
Summary: refill  Phone Note Call from Patient Call back at [phone]   Caller: pt live Call For: Clent Ridges  Summary of Call: adderall 30 mg 1 3 times a day  Initial call taken by: Roselle Locus,  February 05, 2009 12:08 PM  Follow-up for Phone Call        done Follow-up by: Nelwyn Salisbury MD,  February 06, 2009 10:18 AM  Additional Follow-up for Phone Call Additional follow up Details #1::        rx up front, pt aware Additional Follow-up by: Alfred Levins, CMA,  February 06, 2009 1:35 PM    New/Updated Medications: ADDERALL 30 MG  TABS (AMPHETAMINE-DEXTROAMPHETAMINE) three times a day ADDERALL 30 MG  TABS (AMPHETAMINE-DEXTROAMPHETAMINE) three times a day, may fill on 03-09-09 ADDERALL 30 MG  TABS (AMPHETAMINE-DEXTROAMPHETAMINE) three times a day, may fill on 04-08-09   Prescriptions: ADDERALL 30 MG  TABS (AMPHETAMINE-DEXTROAMPHETAMINE) three times a day, may fill on 04-08-09  #90 x 0   Entered and Authorized by:   Nelwyn Salisbury MD   Signed by:   Nelwyn Salisbury MD on 02/06/2009   Method used:   Print then Give to Patient   RxID:   [id number] ADDERALL 30 MG  TABS (AMPHETAMINE-DEXTROAMPHETAMINE) three times a day, may fill on 03-09-09  #90 x 0   Entered and Authorized by:   Nelwyn Salisbury MD   Signed by:   Nelwyn Salisbury MD on 02/06/2009   Method used:   Print then Give to Patient   RxID:   [id number] ADDERALL 30 MG  TABS (AMPHETAMINE-DEXTROAMPHETAMINE) three times a day  #90 x 0   Entered and Authorized by:   Nelwyn Salisbury MD   Signed by:   Nelwyn Salisbury MD on 02/06/2009   Method used:   Print then Give to Patient   RxID:   [id number]

## 2010-12-23 NOTE — Progress Notes (Signed)
Summary: RF Adderall, paper chart to Dr Clent Ridges for review.  Phone Note Call from Patient Call back at [phone] (cell)   Caller: Patient Call For: Dr Clent Ridges Reason for Call: Refill Medication Summary of Call: pt need a rf adderall 30mg  1 tab 3 times a day  pls call back when to pick up  Initial call taken by: Shan Levans,  August 30, 2007 1:43 PM  Follow-up for Phone Call        please clarify this. He was taking this once a day. I will not rx this much for him. Follow-up by: Nelwyn Salisbury MD,  August 31, 2007 8:00 AM  Additional Follow-up for Phone Call Additional follow up Details #1::        LMTCB ..................................................................Marland KitchenSid Falcon LPN  August 31, 2007 9:14 AM  Pt called back and reported he was increased to three times a day on OV 05/13/07.  Paper chart confirmed.  Additional Follow-up by: Sid Falcon LPN,  August 31, 2007 11:31 AM    Additional Follow-up for Phone Call Additional follow up Details #2::    pt called back said he can be reached again at [phone]  [phone] (home) Follow-up by: Shan Levans,  August 31, 2007 9:54 AM  Additional Follow-up for Phone Call Additional follow up Details #3:: Details for Additional Follow-up Action Taken: ok, I wrote for 3 months pt aware ..................................................................Marland KitchenAlfred Levins, CMA  September 01, 2007 9:10 AM Additional Follow-up by: Nelwyn Salisbury MD,  September 01, 2007 8:05 AM  New/Updated Medications: ADDERALL 30 MG  TABS (AMPHETAMINE-DEXTROAMPHETAMINE) three times a day ADDERALL 30 MG  TABS (AMPHETAMINE-DEXTROAMPHETAMINE) three times a day, may fill on 10-02-07 ADDERALL 30 MG  TABS (AMPHETAMINE-DEXTROAMPHETAMINE) three times a day, may fill on 11-01-07   Prescriptions: ADDERALL 30 MG  TABS (AMPHETAMINE-DEXTROAMPHETAMINE) three times a day, may fill on 11-01-07  #90 x 0   Entered and Authorized by:   Nelwyn Salisbury MD   Signed by:    Nelwyn Salisbury MD on 09/01/2007   Method used:   Print then Give to Patient   RxID:   [id number] ADDERALL 30 MG  TABS (AMPHETAMINE-DEXTROAMPHETAMINE) three times a day, may fill on 10-02-07  #90 x 0   Entered and Authorized by:   Nelwyn Salisbury MD   Signed by:   Nelwyn Salisbury MD on 09/01/2007   Method used:   Print then Give to Patient   RxID:   [id number] ADDERALL 30 MG  TABS (AMPHETAMINE-DEXTROAMPHETAMINE) three times a day  #90 x 0   Entered and Authorized by:   Nelwyn Salisbury MD   Signed by:   Nelwyn Salisbury MD on 09/01/2007   Method used:   Print then Give to Patient   RxID:   [id number]

## 2010-12-23 NOTE — Progress Notes (Signed)
Summary: Follow-up phone call  Phone Note Outgoing Call Call back at Home Phone [phone]   Call placed by: Sid Falcon LPN,  May 10, 2008 11:13 AM Call placed to: Patient Summary of Call: Called pt in follow-up per request of Felipa Evener.  Pt was resting, very fatigued with cough sx and having premies Victory Dakin (other identical baby girl) and Aiten (baby boy), other baby girl triplet deceased.  Pt started Tamiflu yesterday as instructed. Pt instructed on registration process tomorrow.  Pt voiced his understanding.  Front desk and nurse made aware. Initial call taken by: Sid Falcon LPN,  May 10, 2008 11:18 AM  Follow-up for Phone Call        Phone call completed, Appointment scheduled Follow-up by: Alfred Levins, CMA,  May 11, 2008 8:06 AM

## 2010-12-23 NOTE — Progress Notes (Signed)
Summary: refill xanax  Phone Note Refill Request Message from:  Pharmacy on October 30, 2009 8:22 AM  Refills Requested: Medication #1:  ALPRAZOLAM 1 MG TABS 5 times a day  Method Requested: Electronic Initial call taken by: Alfred Levins, CMA,  October 30, 2009 8:22 AM  Follow-up for Phone Call        call in Xanax #150 with 5 rf Follow-up by: Nelwyn Salisbury MD,  October 30, 2009 8:40 AM  Additional Follow-up for Phone Call Additional follow up Details #1::        Rx called to pharmacy Additional Follow-up by: Alfred Levins, CMA,  October 30, 2009 8:51 AM    Prescriptions: ALPRAZOLAM 1 MG TABS (ALPRAZOLAM) 5 times a day  #150 x 5   Entered by:   Alfred Levins, CMA   Authorized by:   Nelwyn Salisbury MD   Signed by:   Alfred Levins, CMA on 10/30/2009   Method used:   Telephoned to ...       [facility]  [address] # [id number]* (retail)       [address]       [place], [place]  [address]       Ph: [phone] or [phone]       Fax: [phone]   RxID:   [id number]

## 2010-12-23 NOTE — Assessment & Plan Note (Signed)
Summary: INJECTION//LH GRAM OF ROCHEPIN?   Nurse Visit   Allergies: 1)  Sulfamethoxazole (Sulfamethoxazole) 2)  Bactrim (Sulfamethoxazole-Trimethoprim)  Medication Administration  Injection # 1:    Medication: Rocephin  250mg     Diagnosis: PNEUMONIA (ICD-486)    Route: IM    Site: LUOQ gluteus    Exp Date: 03/23/2012    Lot #: [id number]    Mfr: novaplus    Comments: Total of 1 gm given    Given by: Lynann Beaver CMA (October 04, 2009 1:14 PM)  Orders Added: 1)  Rocephin  250mg  [J0696] 2)  Admin of Therapeutic Inj  intramuscular or subcutaneous [81191]

## 2010-12-23 NOTE — Progress Notes (Signed)
Summary: rf  Medications Added ADDERALL 30 MG  TABS (AMPHETAMINE-DEXTROAMPHETAMINE) three times a day, may fill on 01/12/08 ADDERALL 30 MG  TABS (AMPHETAMINE-DEXTROAMPHETAMINE) three times a day       Phone Note Call from Patient Call back at [phone]   Call For: fry Summary of Call: Needs the 3 written Rxs for Adderall 30mg  one three times a day.  Is out of med.  Call to pickup Rxs at [phone] Initial call taken by: Rudy Jew, RN,  December 12, 2007 11:05 AM  Follow-up for Phone Call        Phone Call Completed Follow-up by: Alfred Levins, CMA,  December 12, 2007 2:24 PM    New/Updated Medications: ADDERALL 30 MG  TABS (AMPHETAMINE-DEXTROAMPHETAMINE) three times a day, may fill on 01/12/08 ADDERALL 30 MG  TABS (AMPHETAMINE-DEXTROAMPHETAMINE) three times a day   Prescriptions: ADDERALL 30 MG  TABS (AMPHETAMINE-DEXTROAMPHETAMINE) three times a day  #90 x 0   Entered by:   Alfred Levins, CMA   Authorized by:   Nelwyn Salisbury MD   Signed by:   Alfred Levins, CMA on 12/12/2007   Method used:   Print then Give to Patient   RxID:   [id number] ADDERALL 30 MG  TABS (AMPHETAMINE-DEXTROAMPHETAMINE) three times a day, may fill on 01/12/08  #90 x 0   Entered by:   Alfred Levins, CMA   Authorized by:   Nelwyn Salisbury MD   Signed by:   Alfred Levins, CMA on 12/12/2007   Method used:   Print then Give to Patient   RxID:   [id number] ADDERALL 30 MG  TABS (AMPHETAMINE-DEXTROAMPHETAMINE) three times a day, may fill on 02-09-08  #90 x 0   Entered by:   Alfred Levins, CMA   Authorized by:   Nelwyn Salisbury MD   Signed by:   Alfred Levins, CMA on 12/12/2007   Method used:   Print then Give to Patient   RxID:   [id number]

## 2010-12-23 NOTE — Progress Notes (Signed)
Summary: refill ambien  Phone Note From Pharmacy   Caller: [facility]  [address] # [id number]* Call For: Pranish Akhavan  Reason for Call: Needs renewal Summary of Call: refill zolpidem tartrate 10mg  1 by mouth at bedtime Initial call taken by: Alfred Levins, CMA,  Apr 17, 2008 3:29 PM  Follow-up for Phone Call        ok, call in #30 with 5 rf Follow-up by: Nelwyn Salisbury MD,  Apr 17, 2008 3:41 PM  Additional Follow-up for Phone Call Additional follow up Details #1::        Phone call completed, Pharmacist called Additional Follow-up by: Alfred Levins, CMA,  Apr 17, 2008 4:09 PM      Prescriptions: AMBIEN 10 MG TABS (ZOLPIDEM TARTRATE) Take 1 tablet by mouth at bedtime  #30 Tablet x 5   Entered by:   Alfred Levins, CMA   Authorized by:   Nelwyn Salisbury MD   Signed by:   Alfred Levins, CMA on 04/17/2008   Method used:   Telephoned to ...       [facility]  [address] # [id number]*       [address]       [place], [place]  [address]       Ph: [phone] or [phone]       Fax: [phone]   RxID:   [id number]

## 2010-12-23 NOTE — Consult Note (Signed)
Summary: [facility]  [facility]   Imported By: Lanelle Bal 05/01/2008 11:58:33  _____________________________________________________________________  External Attachment:    Type:   Image     Comment:   External Document
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
## 2010-12-23 NOTE — Letter (Signed)
Summary: E-Mail from Patient  E-Mail from Patient   Imported By: Lanelle Bal 05/02/2008 13:53:41  _____________________________________________________________________  External Attachment:    Type:   Image     Comment:   External Document

## 2010-12-23 NOTE — Progress Notes (Signed)
Summary: refill  Phone Note Call from Patient Call back at [phone]   Caller: pt live Call For: Clent Ridges  Summary of Call: adderall 30mg   Initial call taken by: Roselle Locus,  May 23, 2008 1:01 PM  Follow-up for Phone Call        done Follow-up by: Nelwyn Salisbury MD,  May 23, 2008 4:59 PM  Additional Follow-up for Phone Call Additional follow up Details #1::        rx up front, pt aware Additional Follow-up by: Alfred Levins, CMA,  May 24, 2008 7:48 AM    New/Updated Medications: ADDERALL 30 MG  TABS (AMPHETAMINE-DEXTROAMPHETAMINE) three times a day, may fill on 06-23-08 ADDERALL 30 MG  TABS (AMPHETAMINE-DEXTROAMPHETAMINE) three times a day, may fill on 07-24-08   Prescriptions: ADDERALL 30 MG  TABS (AMPHETAMINE-DEXTROAMPHETAMINE) three times a day, may fill on 07-24-08  #90 x 0   Entered and Authorized by:   Nelwyn Salisbury MD   Signed by:   Nelwyn Salisbury MD on 05/23/2008   Method used:   Print then Give to Patient   RxID:   [id number] ADDERALL 30 MG  TABS (AMPHETAMINE-DEXTROAMPHETAMINE) three times a day, may fill on 06-23-08  #90 x 0   Entered and Authorized by:   Nelwyn Salisbury MD   Signed by:   Nelwyn Salisbury MD on 05/23/2008   Method used:   Print then Give to Patient   RxID:   [id number] ADDERALL 30 MG  TABS (AMPHETAMINE-DEXTROAMPHETAMINE) three times a day  #90 x 0   Entered and Authorized by:   Nelwyn Salisbury MD   Signed by:   Nelwyn Salisbury MD on 05/23/2008   Method used:   Print then Give to Patient   RxID:   [id number]

## 2010-12-23 NOTE — Progress Notes (Signed)
Summary: COUGH SYRUP NEEDED  Phone Note Call from Patient Call back at Home Phone [phone]   Caller: Patient-LIVE CALL Summary of Call: STILL COUGHING. WANTS ANOTHER RX FOR TUSSINEX. CALL [facility] [address]. HE DOES NOT WANT THE MUCCINEX. Initial call taken by: Warnell Forester,  August 30, 2009 2:38 PM  Follow-up for Phone Call        call in Tussionex 1 tsp two times a day as needed cough, 240 ml, no rf Follow-up by: Nelwyn Salisbury MD,  August 30, 2009 3:05 PM  Additional Follow-up for Phone Call Additional follow up Details #1::        Phone Call Completed, Rx Called In Additional Follow-up by: Alfred Levins, CMA,  August 30, 2009 3:41 PM

## 2010-12-23 NOTE — Progress Notes (Signed)
Summary: call a nurse f/u  Phone Note Outgoing Call   Call placed by: Raechel Ache, RN,  January 13, 2010 10:12 AM Call placed to: Patient Summary of Call: [facility] for follow-up call a nurse. Initial call taken by: Raechel Ache, RN,  January 13, 2010 10:13 AM  Follow-up for Phone Call        what does this message mean? I know he was advised to go to Urgent Care over the weekend. Follow-up by: Nelwyn Salisbury MD,  January 13, 2010 10:29 AM  Additional Follow-up for Phone Call Additional follow up Details #1::        we're supposed to call and f/u with these calls to see if they're doing ok. Additional Follow-up by: Raechel Ache, RN,  January 13, 2010 10:34 AM

## 2010-12-23 NOTE — Progress Notes (Signed)
Summary: refill ambien  Phone Note Refill Request   Refills Requested: Medication #1:  AMBIEN 10 MG TABS Take 1 tablet by mouth at bedtime Initial call taken by: Alfred Levins, CMA,  December 02, 2009 8:15 AM  Follow-up for Phone Call        call in #30 with 5 rf  Follow-up by: Nelwyn Salisbury MD,  December 02, 2009 3:47 PM  Additional Follow-up for Phone Call Additional follow up Details #1::        Rx called to pharmacy Additional Follow-up by: Alfred Levins, CMA,  December 03, 2009 10:31 AM    Prescriptions: AMBIEN 10 MG TABS (ZOLPIDEM TARTRATE) Take 1 tablet by mouth at bedtime  #30 x 5   Entered by:   Alfred Levins, CMA   Authorized by:   Nelwyn Salisbury MD   Signed by:   Alfred Levins, CMA on 12/03/2009   Method used:   Telephoned to ...       [facility]  [address] # [id number]* (retail)       [address]       [place], [place]  [address]       Ph: [phone] or [phone]       Fax: [phone]   RxID:   [id number]

## 2010-12-23 NOTE — Progress Notes (Signed)
Summary: pt req refill of Prednisone for lupus  Phone Note Call from Patient Call back at [facility] Phone [phone]   Caller: Patient Summary of Call: Pt called req refill of Prednisone for lupus. Please call in to [facility] on [address]  Initial call taken by: Lucy Antigua,  October 29, 2009 2:11 PM  Follow-up for Phone Call        I got an electronic request for prednisone 20mg  is that ok? Follow-up by: Alfred Levins, CMA,  October 29, 2009 3:37 PM  Additional Follow-up for Phone Call Additional follow up Details #1::        yes okay to refill for 20 mg once daily  Additional Follow-up by: Nelwyn Salisbury MD,  October 29, 2009 4:48 PM    Additional Follow-up for Phone Call Additional follow up Details #2::    rx called in, pt aware Follow-up by: Alfred Levins, CMA,  October 29, 2009 5:00 PM

## 2010-12-23 NOTE — Miscellaneous (Signed)
Summary: Orders Update   Clinical Lists Changes  Orders: Added new Service order of Rocephin  250mg  (A5409) - Signed Added new Service order of Admin of Therapeutic Inj  intramuscular or subcutaneous (81191) - Signed      Medication Administration  Injection # 1:    Medication: Rocephin  250mg     Diagnosis: ACUTE BRONCHITIS (ICD-466.0)    Route: IM    Site: LUOQ gluteus    Exp Date: 06/25/2011    Lot #: [id number]    Mfr: Novaplus    Comments: 1000 mg. Rocephin    Patient tolerated injection without complications    Given by: Lynann Beaver CMA (September 28, 2008 4:59 PM)  Orders Added: 1)  Rocephin  250mg  [J0696] 2)  Admin of Therapeutic Inj  intramuscular or subcutaneous [62130]
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
## 2010-12-23 NOTE — Letter (Signed)
Summary: Full Evaluation/[facility]  Full Evaluation/[facility]   Imported By: Sherian Rein 03/30/2009 09:34:29  _____________________________________________________________________  External Attachment:    Type:   Image     Comment:   External Document

## 2010-12-23 NOTE — Progress Notes (Signed)
  Phone Note Call from Patient Call back at [facility] Phone [phone]   Caller: Patient Call For: Nelwyn Salisbury MD Summary of Call: per  pt  call c/o abdominal swelling and severe abdominal pain. Initial call taken by: Shelbie Proctor,  Apr 11, 2009 1:48 PM  Follow-up for Phone Call        go to er now Follow-up by: Minus Breeding MD,  Apr 11, 2009 2:01 PM  Additional Follow-up for Phone Call Additional follow up Details #1::        called pt to inform states he should go to the er, pt states his sweeling has gone down Additional Follow-up by: Shelbie Proctor,  Apr 11, 2009 3:43 PM

## 2010-12-23 NOTE — Progress Notes (Signed)
Summary: rx  Phone Note Call from Patient Call back at [phone]   Caller: pt live Call For: Clent Ridges  Summary of Call: was in last week and got antibiotics.  He is getting thrush could you call in Niastatin oral rinse.  [facility] [address] [phone] The diflucan does not allow the xanax to metabolize Initial call taken by: Roselle Locus,  November 09, 2008 12:04 PM  Follow-up for Phone Call        call in Nystatin susp. swish and swallow 5 ml 4 times a day as needed thrush, 300 ml with 5 rf Follow-up by: Nelwyn Salisbury MD,  November 09, 2008 3:37 PM  Additional Follow-up for Phone Call Additional follow up Details #1::        Phone Call Completed, Rx Called In Additional Follow-up by: Alfred Levins, CMA,  November 09, 2008 3:42 PM

## 2010-12-23 NOTE — Letter (Signed)
Summary: [facility] operative report  [facility] operative [facility]   Imported By: Kassie Mends 01/06/2008 09:17:01  _____________________________________________________________________  External Attachment:    Type:   Image     Comment:   [facility] operative report
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
## 2010-12-23 NOTE — Progress Notes (Signed)
Summary: Call-A-Nurse Report    [facility]-A-Nurse Triage Call Report Triage Record Num: [id number] Operator: Craig Guess Patient Name: [name] Call Date & Time: 01/12/2010 5:38:28PM Patient Phone: [phone] PCP: Tera Mater. Clent Ridges Patient Gender: Male PCP Fax : [phone] Patient DOB: 08/14/1977 Practice Name: [facility] Reason for Call: Caller reports he was seen in the office on Friday and dx'd with bronchitis and a sinus infection. Caller has hx of lupus and takes steriods. Given Rocephin IM and an Rx for Avelox. He has taken 2 of the Avelox and next dose is due now. Caller reports he began to feel some better yesterday 2/19 am, but now feels worse than when he was seen on Friday. Feels warm-unsure of temp. Chest is tight and mucous when he coughs is dark yellow. Mild wheezing at times. Some pain when coughing. Feels weak in general. Caller reports in the past Avelox has not always worked for bronchial infections. Per Colds Protocol, Dr Ermalene Searing notified. Info given. Caller advised he should be seen for re-eval of current sxs. Advised and will go to [facility] per Dr Ermalene Searing. Protocol(s) Used: Upper Respiratory Infections / Colds Recommended Outcome per Protocol: See Provider within 24 hours Override Outcome if Used in Protocol: Call Provider Immediately RN Reason for Override Outcome: Nursing Judgement Used. Reason for Outcome: Productive cough with colored sputum (other than clear or white sputum) Care Advice:  ~ 01/12/2010 6:14:23PM Page 1 of 1 [id number]

## 2010-12-23 NOTE — Letter (Signed)
Summary: clinician communication form  clinician communication form   Imported By: Kassie Mends 08/10/2007 16:33:14  _____________________________________________________________________  External Attachment:    Type:   Image     Comment:    clinician communication form

## 2010-12-23 NOTE — Progress Notes (Signed)
Summary: medication request  Phone Note Call from Patient Call back at [phone]   Caller: Patient Call For: fry Summary of Call: pt cant take zoloft cause of side effects, went to pain center yesterday was given name of medication that he could possibly take. please call [phone], cell [phone] Initial call taken by: Calvert Cantor,  July 06, 2007 9:51 AM  Follow-up for Phone Call        please call pt. Follow-up by: Nelwyn Salisbury MD,  July 06, 2007 10:13 AM  Additional Follow-up for Phone Call Additional follow up Details #1::        spoke with the pt, he has had no appetite since friday, unmotivated, more anxious,could not take zoloft, klonopin 1mg  1-2 times a day was suggested by Dr. Ollen Bowl [phone] Additional Follow-up by: Calvert Cantor,  July 06, 2007 2:35 PM    Additional Follow-up for Phone Call Additional follow up Details #2::    NO, he is already on very high doses of Xanax daily, and Klonopin is the same kind of med. I am at the limit of what I can do for him. I suggest referral to Psychiatry to help [place] out. Follow-up by: Nelwyn Salisbury MD,  July 06, 2007 2:45 PM  Additional Follow-up for Phone Call Additional follow up Details #3:: Details for Additional Follow-up Action Taken: PT AWARE Additional Follow-up by: Alfred Levins, CMA,  July 07, 2007 9:40 AM

## 2010-12-23 NOTE — Progress Notes (Signed)
  Phone Note Call from Patient   Caller: Patient [phone] Reason for Call: Talk to Nurse, Talk to Doctor Summary of Call: Pt called to see if there was a possibllity that he could come in for another Rocephin  injection today.... Pt adv that he is somewhat better but he feels that problems are progressing to chest congestion...Marland KitchenMarland KitchenPlease adv and I will call pt back to advise @ [phone]. Initial call taken by: Debbra Riding,  October 25, 2009 11:13 AM  Follow-up for Phone Call        yes he may come in for a shot only (nursing visit). Give him 1000 mg of Rocephin Follow-up by: Nelwyn Salisbury MD,  October 25, 2009 1:55 PM  Additional Follow-up for Phone Call Additional follow up Details #1::        Called pt at # previously given [phone] -and- [phone] to adv pt about making an appt to come in for Rocephin inj... Left msg.  Called # [phone] and left another msg @ 1443 hrs.  . Additional Follow-up by: Debbra Riding,  October 25, 2009 2:12 PM
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
## 2010-12-23 NOTE — Progress Notes (Signed)
  Phone Note Call from Patient Call back at [phone]   Caller: Patient Summary of Call: paged at 3:00 am on 04-11-09 about progressive abdominal swelling and severe abdominal pain. No nausea or fever. I told him to go to the ER now.  Initial call taken by: Nelwyn Salisbury MD,  Apr 11, 2009 8:44 AM
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
## 2010-12-23 NOTE — Progress Notes (Signed)
Summary: REFILL  Medications Added ALPRAZOLAM 1 MG TABS (ALPRAZOLAM)  AMBIEN 10 MG TABS (ZOLPIDEM TARTRATE) Take 1 tablet by mouth at bedtime CALCIUM 500 500 MG TABS (CALCIUM CARBONATE) Take 1 tablet by mouth twice a day CELLCEPT 500 MG TABS (MYCOPHENOLATE MOFETIL) Take 2 tablet by mouth twice a day DIFLUCAN 100 MG TABS (FLUCONAZOLE) Take 1 tablet by mouth once a day DILAUDID 8 MG TABS (HYDROMORPHONE HCL) Take NORVASC 10 MG TABS (AMLODIPINE BESYLATE) Take 1 tablet by mouth once a day OMEPRAZOLE 20 MG CPDR (OMEPRAZOLE)  PREDNISONE 10 MG TABS (PREDNISONE) as directed PROMETHAZINE HCL 25 MG TABS (PROMETHAZINE HCL)  VITAMIN D3 1000 UNIT TABS (CHOLECALCIFEROL) Take 1 tablet once a day ADDERALL 30 MG  TABS (AMPHETAMINE-DEXTROAMPHETAMINE) 1 a day      Allergies Added: SULFAMETHOXAZOLE (SULFAMETHOXAZOLE) BACTRIM (SULFAMETHOXAZOLE-TRIMETHOPRIM) Phone Note Call from Patient Call back at [phone]   Caller: Patient Call For: FRY Reason for Call: Refill Medication Summary of Call: RF ADDERALL. WILL BE OUT TOMORROW. Initial call taken by: Warnell Forester,  June 16, 2007 12:19 PM  Follow-up for Phone Call        need chart please Follow-up by: Nelwyn Salisbury MD,  June 16, 2007 5:10 PM  Additional Follow-up for Phone Call Additional follow up Details #1::        will print out rx today to pick up Additional Follow-up by: Nelwyn Salisbury MD,  June 17, 2007 2:21 PM   New Allergies: SULFAMETHOXAZOLE (SULFAMETHOXAZOLE) BACTRIM (SULFAMETHOXAZOLE-TRIMETHOPRIM) Additional Follow-up for Phone Call Additional follow up Details #2::    pt notified Follow-up by: Alfred Levins, CMA,  June 17, 2007 2:45 PM  New/Updated Medications: ALPRAZOLAM 1 MG TABS (ALPRAZOLAM)  AMBIEN 10 MG TABS (ZOLPIDEM TARTRATE) Take 1 tablet by mouth at bedtime CALCIUM 500 500 MG TABS (CALCIUM CARBONATE) Take 1 tablet by mouth twice a day CELLCEPT 500 MG TABS (MYCOPHENOLATE MOFETIL) Take 2 tablet by mouth twice a  day DIFLUCAN 100 MG TABS (FLUCONAZOLE) Take 1 tablet by mouth once a day DILAUDID 8 MG TABS (HYDROMORPHONE HCL) Take NORVASC 10 MG TABS (AMLODIPINE BESYLATE) Take 1 tablet by mouth once a day OMEPRAZOLE 20 MG CPDR (OMEPRAZOLE)  PREDNISONE 10 MG TABS (PREDNISONE) as directed PROMETHAZINE HCL 25 MG TABS (PROMETHAZINE HCL)  VITAMIN D3 1000 UNIT TABS (CHOLECALCIFEROL) Take 1 tablet once a day ADDERALL 30 MG  TABS (AMPHETAMINE-DEXTROAMPHETAMINE) 1 a day New Allergies: SULFAMETHOXAZOLE (SULFAMETHOXAZOLE) BACTRIM (SULFAMETHOXAZOLE-TRIMETHOPRIM) Prescriptions: ADDERALL 30 MG  TABS (AMPHETAMINE-DEXTROAMPHETAMINE) 1 a day  #30 x 0   Entered and Authorized by:   Nelwyn Salisbury MD   Signed by:   Nelwyn Salisbury MD on 06/17/2007   Method used:   Print then Give to Patient   RxID:   [id number]

## 2010-12-23 NOTE — Progress Notes (Signed)
Summary: refill med  Phone Note From Pharmacy Call back at [phone]   Caller: [facility]  [address] # [id number]* Summary of Call: refill xanax 1mg  1 by mouth 5 times a day as needed #150 Initial call taken by: Alfred Levins, CMA,  September 30, 2007 3:15 PM  Follow-up for Phone Call        call in #150 with 5 rf Follow-up by: Nelwyn Salisbury MD,  October 03, 2007 9:47 AM  Additional Follow-up for Phone Call Additional follow up Details #1::        Phone call completed, Pharmacist called Additional Follow-up by: Alfred Levins, CMA,  October 03, 2007 11:10 AM    New/Updated Medications: ALPRAZOLAM 1 MG TABS (ALPRAZOLAM) 5 times a day
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
## 2010-12-23 NOTE — Progress Notes (Signed)
Summary: refill and paperwork   Phone Note Refill Request Message from:  Patient  Refills Requested: Medication #1:  ADDERALL 30 MG  TABS three times a day and is his paperwork ready [phone] refill or [phone]  Initial call taken by: Roselle Locus,  June 17, 2009 10:49 AM  Follow-up for Phone Call        done Follow-up by: Nelwyn Salisbury MD,  June 17, 2009 1:06 PM  Additional Follow-up for Phone Call Additional follow up Details #1::        l/m on cell phone that they are up front, ready for p/u Additional Follow-up by: Alfred Levins, CMA,  June 17, 2009 1:22 PM    New/Updated Medications: ADDERALL 30 MG  TABS (AMPHETAMINE-DEXTROAMPHETAMINE) three times a day ADDERALL 30 MG  TABS (AMPHETAMINE-DEXTROAMPHETAMINE) three times a day, may fill on 07-18-09 ADDERALL 30 MG  TABS (AMPHETAMINE-DEXTROAMPHETAMINE) three times a day, may fill on 08-18-09 Prescriptions: ADDERALL 30 MG  TABS (AMPHETAMINE-DEXTROAMPHETAMINE) three times a day, may fill on 08-18-09  #90 x 0   Entered and Authorized by:   Nelwyn Salisbury MD   Signed by:   Nelwyn Salisbury MD on 06/17/2009   Method used:   Print then Give to Patient   RxID:   [id number] ADDERALL 30 MG  TABS (AMPHETAMINE-DEXTROAMPHETAMINE) three times a day, may fill on 07-18-09  #90 x 0   Entered and Authorized by:   Nelwyn Salisbury MD   Signed by:   Nelwyn Salisbury MD on 06/17/2009   Method used:   Print then Give to Patient   RxID:   [id number] ADDERALL 30 MG  TABS (AMPHETAMINE-DEXTROAMPHETAMINE) three times a day  #90 x 0   Entered and Authorized by:   Nelwyn Salisbury MD   Signed by:   Nelwyn Salisbury MD on 06/17/2009   Method used:   Print then Give to Patient   RxID:   [id number]
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
## 2010-12-23 NOTE — Progress Notes (Signed)
Summary: after hours  Phone Note Call from Patient   Caller: Patient Summary of Call: pt calls c/o sinus infxn- wants abx.  told him it's not appropriate to call in abx w/out being seen.  pt understands.  advised OTC decongestant or mucinex to thin congestion until appt tomorrow.  Pt expresses understanding and is in agreement w/ this plan. Initial call taken by: Neena Rhymes MD,  May 27, 2009 9:20 AM

## 2010-12-23 NOTE — Consult Note (Signed)
Summary: [facility]  [facility]   Imported By: Briant Cedar 02/15/2008 16:27:55  _____________________________________________________________________  External Attachment:    Type:   Image     Comment:   External Document
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
## 2010-12-23 NOTE — Miscellaneous (Signed)
Summary: No show   Clinical Lists Changes  Orders: Added new Service order of No Show NS50 (NS50) - Signed
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
## 2010-12-23 NOTE — Progress Notes (Signed)
Summary: REFILL REQUEST   Phone Note Call from Patient Message from:  Patient on June 30, 2010 12:38 PM  Refills Requested: Medication #1:  ADDERALL 30 MG  TABS three times a day   Notes: Pt can be reached at [phone] when same is ready for p/u...Marland KitchenMarland KitchenMarland Kitchen Pt adv that he will need new Rx for 30 mg b.i.d...Marland KitchenMarland KitchenMarland Kitchen Pt adv that he spoke with Dr Clent Ridges about changing Rx (pt adv he only needs to take med b.i.d.)?    Initial call taken by: Debbra Riding,  June 30, 2010 12:41 PM Caller: Patient Call For: Nelwyn Salisbury MD Summary of Call: See Notation for refill....... Pt can be reached at [phone] when same is ready for p/u...Marland KitchenMarland KitchenMarland Kitchen Pt adv that he will need new Rx for Adderall 30 mg b.i.d...Marland KitchenMarland KitchenMarland Kitchen Pt adv that he spoke with Dr Clent Ridges about changing Rx (pt adv he only needs to take med b.i.d.)?  Initial call taken by: Debbra Riding,  June 30, 2010 12:42 PM  Follow-up for Phone Call        done Follow-up by: Nelwyn Salisbury MD,  June 30, 2010 1:15 PM    New/Updated Medications: ADDERALL 30 MG  TABS (AMPHETAMINE-DEXTROAMPHETAMINE) two times a day ADDERALL 30 MG  TABS (AMPHETAMINE-DEXTROAMPHETAMINE) two times a day, may fill on 07-31-10 ADDERALL 30 MG  TABS (AMPHETAMINE-DEXTROAMPHETAMINE) two times a day, may fill on 08-30-10 Prescriptions: ADDERALL 30 MG  TABS (AMPHETAMINE-DEXTROAMPHETAMINE) two times a day, may fill on 08-30-10  #60 x 0   Entered and Authorized by:   Nelwyn Salisbury MD   Signed by:   Nelwyn Salisbury MD on 06/30/2010   Method used:   Print then Give to Patient   RxID:   [id number] ADDERALL 30 MG  TABS (AMPHETAMINE-DEXTROAMPHETAMINE) two times a day, may fill on 07-31-10  #60 x 0   Entered and Authorized by:   Nelwyn Salisbury MD   Signed by:   Nelwyn Salisbury MD on 06/30/2010   Method used:   Print then Give to Patient   RxID:   [id number] ADDERALL 30 MG  TABS (AMPHETAMINE-DEXTROAMPHETAMINE) two times a day  #60 x 0   Entered and Authorized by:   Nelwyn Salisbury MD   Signed by:    Nelwyn Salisbury MD on 06/30/2010   Method used:   Print then Give to Patient   RxID:   [id number]
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
## 2010-12-23 NOTE — Progress Notes (Signed)
Summary: Referral to Ortho?  Phone Note Call from Patient Call back at Home Phone [phone] Call back at [phone]   Caller: Patient Call For: FRY Reason for Call: Referral Summary of Call: PT NEEDING TO SEE A BACK SPECIALIST (LOWER POSTERIOR) PLS CALL [phone] Initial call taken by: Shan Levans,  July 22, 2007 11:28 AM  Follow-up for Phone Call        need more info Follow-up by: Nelwyn Salisbury MD,  July 22, 2007 1:32 PM  Additional Follow-up for Phone Call Additional follow up Details #1::        Pt states he saw Dr Clent Ridges a couple of weeks ago and his back sx were discussed.  He has also been treated by his pain management specialist also in [facility].  Lower back pain seems to be ongoing.  Anti-ifamatories, muscle relaxers do not seem to help Pt requesting referral to a local back specialist, Orthopaedic?  Pt has had a CT scan, "slight degeneration lower back".  His sx seems to be getting worse.  Additional Follow-up by: Sid Falcon LPN,  July 22, 2007 3:13 PM  New Problems: BACK PAIN, CHRONIC (ICD-724.5)   Additional Follow-up for Phone Call Additional follow up Details #2::    refer to Dr. Sharolyn Douglas, an Ortho who specializes in back issues Follow-up by: Nelwyn Salisbury MD,  July 22, 2007 3:53 PM  Additional Follow-up for Phone Call Additional follow up Details #3:: Details for Additional Follow-up Action Taken: phone call completed Additional Follow-up by: Alfred Levins, CMA,  July 22, 2007 3:59 PM  New Problems: BACK PAIN, CHRONIC (ICD-724.5)

## 2010-12-23 NOTE — Progress Notes (Signed)
Summary: NEW RX  Phone Note Call from Patient Call back at Home Phone [phone]   Caller: Patient Call For: DR FRY Reason for Call: Talk to Doctor Summary of Call: HAS THRUSH FROM HIS ABX. WOULD  LIKE LOZENGES CALLED IN -NOT DIFLUCAN. HE IS ON XANAX. CALL [facility] [id number]. THE RX IS CALLED CLOTRIMAZOLE 10MG  TROCHE  5X PER DAY. THIS WAS RX'D PREVIOUSLY BY ANOTHER DR. Initial call taken by: Warnell Forester,  August 17, 2007 1:36 PM  Follow-up for Phone Call        call this in please, #50 with 11 rf Follow-up by: Nelwyn Salisbury MD,  August 17, 2007 2:04 PM  Additional Follow-up for Phone Call Additional follow up Details #1::        Phone Call Completed, Rx Called In Additional Follow-up by: Alfred Levins, CMA,  August 17, 2007 2:49 PM    New/Updated Medications: CLOTRIMAZOLE 10 MG  TROC (CLOTRIMAZOLE) take 5 times per day as needed   Prescriptions: CLOTRIMAZOLE 10 MG  TROC (CLOTRIMAZOLE) take 5 times per day as needed  #50 x 11   Entered by:   Alfred Levins, CMA   Authorized by:   Nelwyn Salisbury MD   Signed by:   Alfred Levins, CMA on 08/17/2007   Method used:   Electronically sent to ...       [facility] [id number]       [address]       [place], [place]  [address]       Ph: [phone] or [phone]       Fax: [phone]   RxID:   [id number]

## 2010-12-23 NOTE — Progress Notes (Signed)
Summary: Testosterone PA  Phone Note From Pharmacy   Caller: [phone] Call For: ID:  [id number]  Summary of Call: PA request--Depo-Testosterone inj. Ok to proceed?  Initial call taken by: Lucious Groves,  December 26, 2009 9:12 AM  Follow-up for Phone Call        yes Follow-up by: Minus Breeding MD,  December 26, 2009 9:28 AM  Additional Follow-up for Phone Call Additional follow up Details #1::        They will fax form. Additional Follow-up by: Lucious Groves,  December 26, 2009 10:53 AM    Additional Follow-up for Phone Call Additional follow up Details #2::    Pending MD signature. Follow-up by: Lucious Groves,  December 26, 2009 11:16 AM  Additional Follow-up for Phone Call Additional follow up Details #3:: Details for Additional Follow-up Action Taken: done  Form faxed. Lucious Groves  December 27, 2009 9:39 AM   Approved until 12-27-2010. *Sent copy to scanning. Lucious Groves  December 27, 2009 11:48 AM  Additional Follow-up by: Minus Breeding MD,  December 26, 2009 6:02 PM
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
## 2011-01-06 ENCOUNTER — Encounter: Payer: Self-pay | Admitting: Family Medicine

## 2011-01-06 ENCOUNTER — Other Ambulatory Visit: Payer: Self-pay | Admitting: Family Medicine

## 2011-01-12 ENCOUNTER — Ambulatory Visit (INDEPENDENT_AMBULATORY_CARE_PROVIDER_SITE_OTHER): Payer: Self-pay | Admitting: Family Medicine

## 2011-01-12 ENCOUNTER — Encounter: Payer: Self-pay | Admitting: Family Medicine

## 2011-01-12 VITALS — BP 130/80 | Temp 98.3°F | Resp 12

## 2011-01-12 DIAGNOSIS — J310 Chronic rhinitis: Secondary | ICD-10-CM

## 2011-01-12 DIAGNOSIS — J329 Chronic sinusitis, unspecified: Secondary | ICD-10-CM

## 2011-01-12 DIAGNOSIS — IMO0001 Reserved for inherently not codable concepts without codable children: Secondary | ICD-10-CM

## 2011-01-12 DIAGNOSIS — J3489 Other specified disorders of nose and nasal sinuses: Secondary | ICD-10-CM

## 2011-01-12 MED ORDER — MOXIFLOXACIN HCL 400 MG PO TABS: 400.0000 mg | ORAL_TABLET | Freq: Every day | ORAL | Status: AC

## 2011-01-12 MED ORDER — CEFTRIAXONE SODIUM 1 G IJ SOLR
1.0000 g | Freq: Once | INTRAMUSCULAR | Status: AC
  Administered 2011-01-12: 1 g via INTRAMUSCULAR

## 2011-01-12 MED ORDER — FLUTICASONE PROPIONATE 50 MCG/ACT NA SUSP: 2.0000 | Freq: Every day | NASAL | Status: DC

## 2011-01-12 NOTE — Progress Notes (Signed)
  Subjective:    Patient ID: [name], male    DOB: 04-07-1971, 40 y.o.   MRN: [id number]  HPI Here for 2 days of sinus pressure, PND, ST, and a dry cough. No fever.    Review of Systems  Constitutional: Negative.   HENT: Positive for congestion, postnasal drip and sinus pressure. Negative for ear pain and rhinorrhea.   Eyes: Negative.   Respiratory: Positive for cough, chest tightness, shortness of breath and wheezing. Negative for apnea, choking and stridor.        Objective:   Physical Exam  Constitutional: He appears well-developed and well-nourished. No distress.  HENT:  Head: Normocephalic and atraumatic.  Right Ear: External ear normal.  Left Ear: External ear normal.  Nose: Nose normal.  Mouth/Throat: Oropharynx is clear and moist. No oropharyngeal exudate.  Eyes: Conjunctivae and EOM are normal. Pupils are equal, round, and reactive to light.  Neck: Neck supple.  Pulmonary/Chest: Effort normal. No respiratory distress. He has wheezes. He has no rales. He exhibits no tenderness.       Soft rhonchi at both bases, as usual   Lymphadenopathy:    He has no cervical adenopathy.          Assessment & Plan:  This is another sinus infection.

## 2011-01-16 ENCOUNTER — Other Ambulatory Visit: Payer: Self-pay | Admitting: Family Medicine

## 2011-01-16 NOTE — Telephone Encounter (Signed)
Alprazolam called in and pt notified

## 2011-01-16 NOTE — Telephone Encounter (Signed)
Pt called to ck on status of refill on med: alprazolam 1mg ........ Same was supposed to have been sent to [facility] [address] [facility] [address]

## 2011-01-16 NOTE — Telephone Encounter (Signed)
DUPLICATE REQUEST

## 2011-01-20 ENCOUNTER — Telehealth: Payer: Self-pay | Admitting: *Deleted

## 2011-01-20 NOTE — Telephone Encounter (Signed)
Refill alprazolam 1mg  1 po prn for anxiety

## 2011-01-21 NOTE — Telephone Encounter (Signed)
Call in 1 mg tid , #90 with 5 rf

## 2011-01-21 NOTE — Telephone Encounter (Signed)
rx faxed to pharmacy

## 2011-02-11 LAB — DIFFERENTIAL
Basophils Absolute: 0 10*3/uL (ref 0.0–0.1)
Basophils Relative: 0 % (ref 0–1)
Eosinophils Absolute: 0 10*3/uL (ref 0.0–0.7)
Eosinophils Relative: 0 % (ref 0–5)
Lymphocytes Relative: 6 % — ABNORMAL LOW (ref 12–46)
Lymphs Abs: 0.4 10*3/uL — ABNORMAL LOW (ref 0.7–4.0)
Monocytes Absolute: 0.2 10*3/uL (ref 0.1–1.0)
Monocytes Relative: 3 % (ref 3–12)
Neutro Abs: 6.8 10*3/uL (ref 1.7–7.7)
Neutrophils Relative %: 91 % — ABNORMAL HIGH (ref 43–77)

## 2011-02-11 LAB — BASIC METABOLIC PANEL
BUN: 9 mg/dL (ref 6–23)
CO2: 26 mEq/L (ref 19–32)
Calcium: 8.6 mg/dL (ref 8.4–10.5)
Chloride: 102 mEq/L (ref 96–112)
Creatinine, Ser: 0.96 mg/dL (ref 0.4–1.5)
GFR calc Af Amer: >60 mL/min (ref >60)
GFR calc non Af Amer: >60 mL/min (ref >60)
Glucose, Bld: 111 mg/dL — ABNORMAL HIGH (ref 70–99)
Potassium: 3.9 mEq/L (ref 3.5–5.1)
Sodium: 135 mEq/L (ref 135–145)

## 2011-02-11 LAB — CBC
HCT: 44.4 % (ref 39.0–52.0)
Hemoglobin: 15.1 g/dL (ref 13.0–17.0)
MCHC: 34 g/dL (ref 30.0–36.0)
MCV: 95.3 fL (ref 78.0–100.0)
Platelets: 176 10*3/uL (ref 150–400)
RBC: 4.67 MIL/uL (ref 4.22–5.81)
RDW: 13.6 % (ref 11.5–15.5)
WBC: 7.4 10*3/uL (ref 4.0–10.5)

## 2011-02-11 LAB — URINALYSIS, ROUTINE W REFLEX MICROSCOPIC
Bilirubin Urine: NEGATIVE
Glucose, UA: NEGATIVE mg/dL
Hgb urine dipstick: NEGATIVE
Ketones, ur: NEGATIVE mg/dL
Nitrite: NEGATIVE
Protein, ur: NEGATIVE mg/dL
Specific Gravity, Urine: 1.012 (ref 1.005–1.030)
Urobilinogen, UA: 0.2 mg/dL (ref 0.0–1.0)
pH: 6 (ref 5.0–8.0)

## 2011-02-20 ENCOUNTER — Other Ambulatory Visit: Payer: Self-pay | Admitting: Endocrinology

## 2011-02-26 LAB — CBC
HCT: 41 % (ref 39.0–52.0)
HCT: 41.5 % (ref 39.0–52.0)
HCT: 41.9 % (ref 39.0–52.0)
Hemoglobin: 14 g/dL (ref 13.0–17.0)
Hemoglobin: 14 g/dL (ref 13.0–17.0)
Hemoglobin: 14 g/dL (ref 13.0–17.0)
MCHC: 33.8 g/dL (ref 30.0–36.0)
MCHC: 34.1 g/dL (ref 30.0–36.0)
MCHC: 33.4 g/dL (ref 30.0–36.0)
MCV: 97 fL (ref 78.0–100.0)
MCV: 98.2 fL (ref 78.0–100.0)
MCV: 96.9 fL (ref 78.0–100.0)
Platelets: 142 10*3/uL — ABNORMAL LOW (ref 150–400)
Platelets: 159 10*3/uL (ref 150–400)
Platelets: 144 10*3/uL — ABNORMAL LOW (ref 150–400)
RBC: 4.22 MIL/uL (ref 4.22–5.81)
RBC: 4.22 MIL/uL (ref 4.22–5.81)
RBC: 4.33 MIL/uL (ref 4.22–5.81)
RDW: 12.7 % (ref 11.5–15.5)
RDW: 13.4 % (ref 11.5–15.5)
RDW: 12.1 % (ref 11.5–15.5)
WBC: 14.5 10*3/uL — ABNORMAL HIGH (ref 4.0–10.5)
WBC: 6.9 10*3/uL (ref 4.0–10.5)
WBC: 8.4 10*3/uL (ref 4.0–10.5)

## 2011-02-26 LAB — DIFFERENTIAL
Basophils Absolute: 0 10*3/uL (ref 0.0–0.1)
Basophils Absolute: 0.2 10*3/uL — ABNORMAL HIGH (ref 0.0–0.1)
Basophils Relative: 0 % (ref 0–1)
Basophils Relative: 3 % — ABNORMAL HIGH (ref 0–1)
Eosinophils Absolute: 0 10*3/uL (ref 0.0–0.7)
Eosinophils Absolute: 0.1 10*3/uL (ref 0.0–0.7)
Eosinophils Relative: 0 % (ref 0–5)
Eosinophils Relative: 1 % (ref 0–5)
Lymphocytes Relative: 6 % — ABNORMAL LOW (ref 12–46)
Lymphocytes Relative: 15 % (ref 12–46)
Lymphs Abs: 0.9 10*3/uL (ref 0.7–4.0)
Lymphs Abs: 1.2 10*3/uL (ref 0.7–4.0)
Monocytes Absolute: 0.6 10*3/uL (ref 0.1–1.0)
Monocytes Absolute: 0.7 10*3/uL (ref 0.1–1.0)
Monocytes Relative: 4 % (ref 3–12)
Monocytes Relative: 9 % (ref 3–12)
Neutro Abs: 13 10*3/uL — ABNORMAL HIGH (ref 1.7–7.7)
Neutro Abs: 6.2 10*3/uL (ref 1.7–7.7)
Neutrophils Relative %: 90 % — ABNORMAL HIGH (ref 43–77)
Neutrophils Relative %: 73 % (ref 43–77)

## 2011-02-26 LAB — BASIC METABOLIC PANEL
BUN: 10 mg/dL (ref 6–23)
BUN: 14 mg/dL (ref 6–23)
CO2: 26 mEq/L (ref 19–32)
CO2: 34 mEq/L — ABNORMAL HIGH (ref 19–32)
Calcium: 8.2 mg/dL — ABNORMAL LOW (ref 8.4–10.5)
Calcium: 8.5 mg/dL (ref 8.4–10.5)
Chloride: 101 mEq/L (ref 96–112)
Chloride: 99 mEq/L (ref 96–112)
Creatinine, Ser: 1.03 mg/dL (ref 0.4–1.5)
Creatinine, Ser: 1.2 mg/dL (ref 0.4–1.5)
GFR calc Af Amer: >60 mL/min (ref >60)
GFR calc Af Amer: >60 mL/min (ref >60)
GFR calc non Af Amer: >60 mL/min (ref >60)
GFR calc non Af Amer: >60 mL/min (ref >60)
Glucose, Bld: 177 mg/dL — ABNORMAL HIGH (ref 70–99)
Glucose, Bld: 104 mg/dL — ABNORMAL HIGH (ref 70–99)
Potassium: 4.2 mEq/L (ref 3.5–5.1)
Potassium: 3.6 mEq/L (ref 3.5–5.1)
Sodium: 135 mEq/L (ref 135–145)
Sodium: 139 mEq/L (ref 135–145)

## 2011-03-06 ENCOUNTER — Other Ambulatory Visit: Payer: Self-pay

## 2011-03-06 MED ORDER — TESTOSTERONE CYPIONATE 200 MG/ML IM SOLN: 200.0000 mg | INTRAMUSCULAR | Status: DC

## 2011-03-06 NOTE — Telephone Encounter (Signed)
Pt called requesting refill of Testosterone oil. Pt has scheduled appt with SAE 04/30. Rx called into pharmacy

## 2011-03-11 ENCOUNTER — Telehealth: Payer: Self-pay | Admitting: Family Medicine

## 2011-03-11 ENCOUNTER — Telehealth: Payer: Self-pay | Admitting: Endocrinology

## 2011-03-11 MED ORDER — AMPHETAMINE-DEXTROAMPHETAMINE 30 MG PO TABS: 30.0000 mg | ORAL_TABLET | Freq: Two times a day (BID) | ORAL | Status: DC

## 2011-03-11 NOTE — Telephone Encounter (Signed)
PA requested for Testosterone CYP 200mg /mL INJ, 1mL PA form requested from [employer] @1 [phone] 03/06/11 Form received & forwarded to MD for completion 03/06/11 Completed form faxed for approval 03/11/11

## 2011-03-11 NOTE — Telephone Encounter (Signed)
PT NEEDS NEW RX ADDERALL 30MG . PT IS OUT.

## 2011-03-11 NOTE — Telephone Encounter (Signed)
done

## 2011-03-11 NOTE — Telephone Encounter (Signed)
rx ready- pt aware.

## 2011-03-12 NOTE — Telephone Encounter (Signed)
Approval received and faxed to pharmacy @[phone] [facility] to inform Pt.

## 2011-03-23 ENCOUNTER — Ambulatory Visit (INDEPENDENT_AMBULATORY_CARE_PROVIDER_SITE_OTHER): Payer: Medicare Other | Admitting: Endocrinology

## 2011-03-23 ENCOUNTER — Encounter: Payer: Self-pay | Admitting: Endocrinology

## 2011-03-23 VITALS — BP 114/62 | HR 98 | Temp 98.9°F | Resp 16 | Wt 169.5 lb

## 2011-03-23 DIAGNOSIS — N320 Bladder-neck obstruction: Secondary | ICD-10-CM

## 2011-03-23 DIAGNOSIS — E291 Testicular hypofunction: Secondary | ICD-10-CM

## 2011-03-23 DIAGNOSIS — G894 Chronic pain syndrome: Secondary | ICD-10-CM

## 2011-03-23 HISTORY — DX: Bladder-neck obstruction: N32.0

## 2011-03-23 MED ORDER — SILDENAFIL CITRATE 100 MG PO TABS: 100.0000 mg | ORAL_TABLET | ORAL | Status: DC | PRN

## 2011-03-23 NOTE — Patient Instructions (Addendum)
blood tests are being ordered for you today.  please call [phone] to hear your test results. pending the test results, please continue the same medications for now normalization of testosterone is not known to harm you.  however, there are "theoretical" risks, including increased fertility, hair loss, prostate cancer, benign prostate enlargement, lower hdl ("good cholesterol"), sleep apnea, and behavior changes.  Please return in 6 months. You should call [phone], to a pharmacy in [place], which sells viagra at $65 for 20 pills.  Here is a prescription.

## 2011-03-23 NOTE — Progress Notes (Signed)
  Subjective:    Patient ID: [name], male    DOB: [birth year], 40 y.o.   MRN: [id number]  HPI Pt says his last testosterone injection was 14 days ago, and he is due later today.  He says his libido and ed are fairly well-controlled.  cialis helps, but he says he does not to use it every time.  He says his pain medication also contributes to his ed sxs.  He says he takes only 150 mg every 2 weeks. No past medical history on file. No past surgical history on file.  reports that he has never smoked. He does not have any smokeless tobacco history on file. His alcohol and drug histories not on file. family history is not on file. Allergies  Allergen Reactions  . Sulfamethoxazole     REACTION: rash  . Sulfamethoxazole W/Trimethoprim     REACTION: unspecified    Review of Systems He has slight urinary hesitancy.      Objective:   Physical Exam GENERAL: no distress GENITALIA: Normal male scrotum, and penis.  Testicles are slight small and soft.      Assessment & Plan:  Hypogonadism.  Uncertain control Chronic pain syndrome.  This contributes to ed sxs.

## 2011-04-07 NOTE — Op Note (Signed)
[name], [name]          ACCOUNT NO.:  [id number]   MEDICAL RECORD NO.:  [id number]          PATIENT TYPE:  OIB   LOCATION:  2550                         FACILITY:  [facility]   PHYSICIAN:  Hermelinda Medicus, M.D.   DATE OF BIRTH:  1971/08/26   DATE OF PROCEDURE:  12/28/2007  DATE OF DISCHARGE:                               OPERATIVE REPORT   PREOPERATIVE DIAGNOSES:  1. History of sleep apnea with persistent snoring.  2. History of tonsillectomy.  3. Septal reconstruction.  4. A history of palatal pharyngoplasty.   POSTOPERATIVE DIAGNOSES:  1. History of sleep apnea with persistent snoring.  2. History of tonsillectomy.  3. Septal reconstruction.  4. A history of palatal pharyngoplasty.   OPERATION:  Revision palatal pharyngoplasty.   SURGEON:  Hermelinda Medicus, MD.   ANESTHESIA:  Local MAC with Dr. Gypsy Balsam.   PROCEDURE:  The patient was placed in the supine position under local  MAC anesthesia using Cetacaine and 1% Xylocaine with epinephrine as an  injection.  The palate was made completely numb, and then using the  laser set at 10 watts continuous for seven minutes, we did the palatal  pharyngoplasty using the laser under local anesthesia.  We did this as a  local considering his medical issues and his respiratory issues.  He did  very well during and postoperatively.  He does have some sinus  infection, which is somewhat of a chronic issue.  He has had previous  surgery at another hospital for septal and sinus, but he will be on  antibiotics and pain medication and will be kept overnight because of  his history of sleep apnea on a pulse oximeter.  The patient is aware of  the risks and gains.  I talked to his family as well as the patient.   FOLLOWUP:  Will remain overnight and then one week, three weeks, six  weeks, three months, six months, and a year.           ______________________________  Hermelinda Medicus, M.D.     JC/MEDQ  D:  12/28/2007  T:  12/28/2007   Job:  [id number]   cc:   Joni Fears D. Maple Hudson, MD, FCCP, FACP  Tera Mater. Clent Ridges, MD  Jackquline Denmark, M.D.

## 2011-04-07 NOTE — H&P (Signed)
NAME:  [name], [name]          ACCOUNT NO.:  [id number]   MEDICAL RECORD NO.:  [id number]          PATIENT TYPE:  OIB   LOCATION:  2550                         FACILITY:  [facility]   PHYSICIAN:  Hermelinda Medicus, M.D.   DATE OF BIRTH:  11/17/1971   DATE OF ADMISSION:  12/28/2007  DATE OF DISCHARGE:                              HISTORY & PHYSICAL   This patient is a 40 year old male who has had a history of snoring and  sleep apnea issues and has had a continuing problem, had a palatal  pharyngoplasty done back in August, 2008.  Previous to that time, he has  had a tonsillectomy.  He has also had a septal reconstruction in the  distant past and has quite good results, but his healing is such that he  is still having more redundant tissue and has had more difficulties with  snoring.  I talked to his wife quite extensively.  She felt he was not  in a sleep apnea situation as far as he was last time, but he was still  having considerable snoring.  He has a septal perforation as well.  He  has lost some weight.  He is down to 185, but he has a considerable  respiratory issue secondary to pulmonary fibrosis.  He has had lupus and  has some avascular necrosis secondary to chronic steroid therapy.  He  also has interstitial pulmonary fibrosis, which is attributed to the  lupus.  He has a history also of a large pulmonary embolus.  Our  immediate concern was the snoring and the history of sleep apnea.  He is  followed by rheumatogist's and a pulmonologist at [facility] and enters with  this history of having had septal surgeries.  He has a septal  perforation because he had considerable septal trauma, apparently in the  past.  His previous history has also been that of sleep apnea with  snoring.  He now enters for a revision of palatal pharyngoplasty.   PAST MEDICAL HISTORY:  He does drink occasionally.  He has never smoked.  He has a history of adenoidectomy and nasal surgery and the intrathecal  pump insertion.  He had a right hip simulator to control the Raynaud's.  He has also had left knee arthroscopy.  His medical history is that of  COPD, interstitial lung disease, which is chronic.  He had a pulmonary  embolus in 2003.  He has systemic lupus erythematosus.  He is cared for  at [facility].  He has a history of also reflux esophagitis.  History of one  kidney.  He uses intrathecal Dilaudid pump.  Apparently, his base rate  is 9.814 per day.   MEDICATIONS:  1. Prilosec over-the-counter.  2. Prednisone 20 mg per day.  3. Hydroxychloroquine 200 per day.  4. Tizanidine HCL 4 mg per day.  5. Zolpidem 10 per day.  6. Alprazolam 1 per day.  7. Amlodipine 10.  8. Amphetamine 30 mg 3 times a day.  9. Fluconazole 100 mg x2 daily.  10.MiraLax.  11.CellSept 1000 mg daily.  12.He will also be on an antibiotic under my care using  Ceftin, and I      will give him some supplemental Vicodin 1 tablet q.4-6h. p.r.n.   He is allergic to SULFA, MORPHINE, and DEMEROL.   Also, his EKG is within normal limits.   His chest x-ray shows no progressive disease.   PHYSICAL EXAMINATION:  His blood pressure is 117/70, pulse 80.  Ears are clear.  Tympanic membranes are clear.  Palate is quite  redundant and is well healed more than expected after this palatal  pharyngoplasty done in April, 2008.  He has some postnasal drainage, and  he has been on antibiotics for his sinuses in the recent past.  His neck is free of any thyromegaly, cervical adenopathy, or mass.  CHEST:  Clear.  No rales, rhonchi or wheezes.  CARDIOVASCULAR:  No murmurs, rubs or gallops.  EKG is normal.  ABDOMEN:  Unremarkable.  EXTREMITIES:  Unremarkable.   DIAGNOSES:  1. Systemic lupus.  2. Raynaud's.  3. Bone infarction, chronic bone pain attributed to steroid.  4. Neutropenia.  5. Pulmonary embolism in 2004.  6. Avascular necrosis.  7. Interstitial lung disease.  8. Exercise-induced asthma.  9. Sinus congestion.   10.History of tonsillectomy.  11.History of septal trauma and then septal surgery with perforation.  12.Allergy/skin issues.  13.He has also had the sleep apnea, which is improved, but he is still      snoring considerably and is here having a palatal pharyngoplasty.           ______________________________  Hermelinda Medicus, M.D.     JC/MEDQ  D:  12/28/2007  T:  12/28/2007  Job:  [id number]   cc:   Jeannett Senior A. Clent Ridges, MD  Clinton D. Maple Hudson, MD, FCCP, FACP  Dr. Misty Stanley Christione-Schriber

## 2011-04-10 ENCOUNTER — Telehealth: Payer: Self-pay | Admitting: Family Medicine

## 2011-04-10 MED ORDER — AMPHETAMINE-DEXTROAMPHETAMINE 30 MG PO TABS: 30.0000 mg | ORAL_TABLET | Freq: Two times a day (BID) | ORAL | Status: DC

## 2011-04-10 NOTE — Consult Note (Signed)
   NAME:  [name], [name]                    ACCOUNT NO.:  [id number]   MEDICAL RECORD NO.:  [id number]                   PATIENT TYPE:  REC   LOCATION:  [facility]                                  FACILITY:  [facility]   PHYSICIAN:  Hans C. Stevphen Rochester, M.D.                DATE OF BIRTH:  10/17/1971   DATE OF CONSULTATION:  09/05/2002  DATE OF DISCHARGE:                                   CONSULTATION   HISTORY OF PRESENT ILLNESS:  [facility]. I evaluated him and I reviewed the health and history form  and 14-point review of systems.   1. We are going to increase his Duragesic to 100 mcg.  Dr. __________ has     evaluated him.  We are going to go head with implantation.  I do think he     has been forthright.  He brings his medications in for assessment, and     states no wish to harm self or others.  We reviewed patient care     agreement and I do think until we have the stimulator placed, it is     reasonable to provide him increased analgesic capacity as it continues to     where he is functional, and I want to avoid going up on the breakthrough     medication.  In fact, I am hoping after the stimulator, we can     systematically reduce the amount of pain medication he is taking, and     move to p.r.n. or breakthrough approach.  2. I do not believe further imaging or diagnostics are warranted.  3. Originally scheduled for stellate ganglion block.  I am going to go ahead     and hold off.  We will see him in follow-up after stimulator implantation     and I will discuss with Dr. __________`.   OBJECTIVE:  He has adequate vascular capacity today, capillary filling is  noted and he has no new neurological findings.  No advancing pseudomotor  changes.  His color is actually adequate today.   IMPRESSION:  Raynaud's phenomenon.   PLAN:  Conservative management.  Review medication, review the risk of these  medications.  He understands the close  necessity of patient care agreement  and potential for random drug screens.  Review patient of habituating nature  and risk of these medications.  He accepts.                                               Hans C. Stevphen Rochester, M.D.    [facility] [place]  D:  09/05/2002  T:  09/06/2002  Job:  [id number]

## 2011-04-10 NOTE — Consult Note (Signed)
NAME:  [name], [name]          ACCOUNT NO.:  [id number]   MEDICAL RECORD NO.:  [id number]          PATIENT TYPE:  INP   LOCATION:  5030                         FACILITY:  [facility]   PHYSICIAN:  Danice Goltz, M.D. LHCDATE OF BIRTH:  [birth year]   DATE OF CONSULTATION:  03/11/2006  DATE OF DISCHARGE:                                   CONSULTATION   REQUESTING PHYSICIAN:  Dr. Romero Belling.   REASON FOR CONSULTATION:  Evaluation of patient with systemic lupus  erythematosus and bilateral infiltrates.   HISTORY OF PRESENT ILLNESS:  [name] is a very pleasant 40 year old  gentleman with a history of systemic lupus erythematosus diagnosed as far  back as 1987.  He has been on longstanding steroid therapy.  He presented on  April 18 with a day's worth of high fevers, associated productive cough,  headaches and occasional shaking chills.. He also had difficulties with  pleuritic-type pain anteriorly in the chest.  The patient had generalized  malaise, as well.  He apparently, however, describes having had a 35-month  history of having chest congestion which will not go away.  He has had  no hemoptysis.Marland Kitchen   PAST MEDICAL HISTORY REMARKABLE FOR:  1.  Systemic lupus erythematous.  The patient is currently followed at [facility] for the same.  He has a history of chronic      leukopenia due to__________ medications.  2.  History of pulmonary emboli in 2005.  3.  Osteonecrosis due to chronic prednisone with chronic pain syndrome      associated with the same.  4.  Anxiety and depression.  5.  Hypertension.  6.  The patient has a history of childhood asthma and exercise-induced      asthma in the past, which he  grew out of, and also has a history of      gastroesophageal reflux aggravated by CellCept and prednisone.   CURRENT MEDICATIONS:  Are as reviewed on the MAR.  Of note is that the  patient is on immunosuppressive, namely CellCept.  He is also on Plaquenil  and prednisone, currently Solu-Medrol here in the hospital.   ALLERGIES:  PATIENT DENIES ANY MEDICAL ALLERGIES.   SOCIAL HISTORY:  He is disabled due to his osteonecrosis and chronic pain.  He is married, has 1 child, lives with his wife and child.   FAMILY HISTORY:  Noncontributory.   REVIEW OF SYSTEMS:  Review of systems was essentially as noted above,  otherwise noncontributory with the exception of his joint pains which are a  chronic issue.   PHYSICAL EXAMINATION:  VITAL SIGNS:  Blood pressure was 125/77, respirations  were 20, pulse of 87, temperature 98.2, though the patient has had a T-max  of 101.3, saturations are 98% on room air.  GENERAL:  In general, this is a mildly obese white male who is in no acute  respiratory distress at the time of this evaluation.  HEENT:  Examination reveals that the patient speaks with a nasal speech.  He  has mild rhinitis changes, otherwise unremarkable.  NECK:  Neck is supple. No adenopathy  noted.  No JVD.  LUNGS:  He has bibasilar crackles, diffusely, greater on the left than on  the right.  No wheezes are noted.  These are mostly Velcro-type crackles.  CARDIAC EXAMINATION:  Regular rate and rhythm. No rubs, murmurs or gallops  heard. ABDOMEN:  Soft, nontender.  He does have an implanted pump in his  right upper quadrant, which is for the Dilaudid infusion for management of  chronic pain.  GENITAL AND RECTAL:  Were deferred as they were not germane to the current  issue of being evaluated.  EXTREMITIES:  He has no obvious extremity deformities.  He does have some  mild swelling along the PIP and the IP joints, but this is very minimal.  NEUROLOGIC:  Examination is grossly nonfocal.   LABORATORY DATA:  TSH is normal.  Culture of the sputum shows that the  patient has gram-positive cocci as well as gram-negative rods in the sputum.  Urine culture is negative so far.  Chemistries show a potassium 3.7, sodium  of 138, chloride of 103, CO2 of  27, glucose of 89, BUN of 10, creatinine of  1.3, albumin is slightly decreased at 2.0.  CBC:  The patient has a white  count of 11.3, H&H of 15.3 and 45.2, and platelets of 158,000.  The patient  did have a left shift noted on admission.   Chest x-ray has been reviewed.  The patient has mild bilateral interstitial  changes with atelectasis bilaterally.  Interstitial changes likely  accentuated.   IMPRESSION:  1.  Bilateral pulmonary infiltrates in a patient with immunosuppression due      to connective tissue disease, namely systemic lupus erythematosus, as      well as medications, namely CellCept, Plaquenil and prednisone.  The      patient with obvious mucopurulent sputum production and a Gram stain      that is significant for a bacterial infection.  I suspect that the      patient has an acute bacterial pneumonia.  The possibility of lupus      pneumonitis certainly needs to be entertained, but, at present, I      believe that the issues is more an acute infectious process.  2.  Because of the history of pulmonary embolism in the past and pleuritic      pain, this etiology needs to be ruled out.  The patient is currently on      subcu heparin t.i.d.; however, further workup is necessary.  3.  Systemic lupus erythematosus, which appears to be fairly well      compensated.   PLAN:  1.  I have requested that chest x-ray, PA and lateral, be done in the      morning to have follow up.  2.  Continue antibiotics.  However, I will increase the coverage from      Rocephin to Maxipime to cover potential gram-negatives.  3.  Obtain sputum for PCP for completeness.  4.  I agree with a CT-angio of chest with PE protocol.  I will also add a CT      of the sinuses to further evaluate the potential for chronic sinusitis.  5.  We will discontinue the Atrovent, as this could be drying the secretions      and the patient is definitely having difficulties with tenacious     secretions.  Instead,  we will continue albuterol with the use of EzPAP      and Acapella flutter valve  to help the patient with mucociliary      clearance.  6.  We will check urine for streptococcal and Legionella antigens.  7.  We will change the patient's Protonix to twice a day, given his      gastroesophageal reflux symptoms.  8.  Add Mucinex as a mucolytic.  9.  Further recommendations pending the patient's response to the above.  I      would certainly, of course, continue him on the Solu-Medrol as you      currently are doing.   Thank you very much for allowing me to participate in this gentleman's care.  We will follow along with you.           ______________________________  Danice Goltz, M.D. [facility]     LG/MEDQ  D:  03/11/2006  T:  03/12/2006  Job:  [id number]   cc:   Gregary Signs A. Everardo All, M.D. [facility]  [address]  [place]  [place] [address]

## 2011-04-10 NOTE — Op Note (Signed)
NAME:  [name], [name]                    ACCOUNT NO.:  [id number]   MEDICAL RECORD NO.:  [id number]                   PATIENT TYPE:  OIB   LOCATION:  5009                                 FACILITY:  [facility]   PHYSICIAN:  Burnard Bunting, M.D.                 DATE OF BIRTH:  1971/02/26   DATE OF PROCEDURE:  10/01/2003  DATE OF DISCHARGE:  10/02/2003                                 OPERATIVE REPORT   PREOPERATIVE DIAGNOSIS:  Left knee medial femoral condyle avascular  necrosis.   POSTOPERATIVE DIAGNOSIS:  Left knee medial femoral condyle avascular  necrosis.   PROCEDURE:  1. Left knee diagnostic and operative arthroscopy.  2. Left knee cord decompression with bone grafting, medial femoral condyle.   SURGEON:  Burnard Bunting, M.D.   ANESTHESIA:  General endotracheal.   ESTIMATED BLOOD LOSS:  None.   OPERATIVE FINDINGS:  1. Diagnostic and operative arthroscopy with intact patellar, femoral,     medial and lateral compartments.  2. No condylar defects or softening noted.   PROCEDURE IN DETAIL:  The patient was brought to the operating room, where  general endotracheal anesthesia was induced and preoperative monitoring  lines were administered. The right leg was prepped with DuraPrep solution  and draped in a sterile manner.  Typical anatomy of the knee was identified  including the medial condyle, medial lateral joint line as well as the  medial lateral border of the patellar tendon and patella.  The anterior and  inferolateral portals were established.  Anterior and inferomedial portals  were then established under direct visualization.   Diagnostic arthroscopy was performed.  The patellofemoral compartment was  intact.  Medial and lateral compartments were intact with no meniscal tear  and no evidence of condylar delamination or softening.  Following diagnostic  arthroscopy, a small incision was made at the area of the medial epicondyle.  After ports for preoperative end  plating and wide examination, a guide pin  was placed within 1-2 mm of the epicondyle surface in the area of avascular  necrosis.  A 6 mm drill was then drilled over the guidewire in order to  decompress this area.  Synthetic demineralized bone matrix was then  delivered into the canal, created by the poor decompression.  The correct  placement of the guide pin was confirmed in both the AP and lateral planes  under fluoroscopy.  Demineralized bone matrix was then injected into the  decompressed bone region.  At this time the small 5 mm incision above the  area of the epicondyle was closed with nylon sutures.  The patient's knee  was placed in soft knee dressing. A combination of morphine, clonidine,  Marcaine was then injected into the knee for postoperative pain control. The  patient tolerated the procedure well and without immediate complications.  Burnard Bunting, M.D.   GSD/MEDQ  D:  11/22/2003  T:  11/22/2003  Job:  [id number]

## 2011-04-10 NOTE — Consult Note (Signed)
[facility]  Patient:    [name], [name] Visit Number: [id number] MRN: [id number]          Service Type: PMG Location: [facility] Attending Physician:  Sondra Come Dictated by:   Sondra Come, D.O. Proc. Date: 05/24/02 Admit Date:  05/23/2002   CC:         Jeannett Senior A. Clent Ridges, M.D.   Consultation Report  NEW PATIENT CONSULTATION  REFERRING Ekansh Sherk:  Dr. Gershon Crane.  Dear Dr. Clent Ridges,       Thank you very much for kindly referring [name] to [facility] for [facility].  The patient was seen in our clinic today.  Please refer to the following for details regarding the history and physical examination and treatment recommendations.  Once again, thank you for allowing [place] to participate in the care of [name].  CHIEF COMPLAINT:  Systemic lupus, Raynauds phenomenon, avascular necrosis of bilateral knees, interstitial lung disease.  HISTORY OF PRESENT ILLNESS:  [name] is a pleasant 40 year old right-hand dominant male who was referred by Dr. Clent Ridges for evaluation for chronic bilateral knee pain secondary to avascular necrosis developed following treatment with prednisone for lupus.  He was diagnosed with systemic lupus in 1998 by a rheumatologist in [place].  He is currently followed by a rheumatologist at [facility] in [place].  He has also been diagnosed with dermatomyositis and Raynauds phenomenon.  He was treated with prednisone and Imuran and developed neutropenia and avascular necrosis bilateral knees.  He has been evaluated by an orthopedist at [facility] who has given the patient an option of some type of surgical procedure, and ultimately bilateral knee replacements when symptoms worsen.  He was also previously followed by Dr. Loletta Parish at the [facility], but apparently was discharged from their clinic for altering a prescription  for Duragesic.  The patients story is that he was going out of town and was not able to get his prescription refilled until the following day, so he altered the date on the prescription so that he could get his prescription and leave town for a wedding.  Because of this, Dr. Loletta Parish discharged him from the clinic per the patients report.  During their treatment course at the pain clinic, he underwent stellate ganglion blocks with relief of his Raynauds symptoms.  Dr. Loletta Parish apparently had the patient fairly well stabilized on Duragesic 75 mcg and oxycodone for break through pain.  Currently, the patient is getting his prescriptions from his primary care physician, Dr. Clent Ridges.  He is currently on Duragesic 75 mcg every three days, which the patient states helps his knee pain significantly.  He also take oxycodone 5 mg for breakthrough pain.  He states that one tablet does not work, and to get relief on some days he takes two to three up to q.i.d., but he is currently out of these medications per his report.  He does not bring any of his medications with him.  He has tried multiple medications in the past, including Neurontin, Zanaflex, and clonidine which did not seem to help.  He was on Norvasc at one point, which was started by his rheumatologist.  He states that significant helped his Raynauds, however, this was discontinued when he began having the stellate ganglion blocks.  He is interested in pursuing further stellate ganglion blockade or other alternative and interventional therapies.  He has not had any physical therapy to date.  I  reviewed the health and history form and 14 point review of systems.  The patients pain is a 4 to 5/10 on a subjective scale, and described as constant, dull, achy, throbbing, sharp, burning, lancinating, stabbing, with associated numbness, tingling, and weakness.  His function and quality of life indices remains good with the medications.  His sleep is  fair. Symptoms are worse with working, prolonged walking.  Improved with rest and medications.  REVIEW OF SYSTEMS:  Unexplained fatigue, sore tongue, chest discomfort, shortness of breath, chronic cough, solitary kidney, rash.  PAST MEDICAL HISTORY: 1. Lupus. 2. Raynauds. 3. Avascular necrosis. 4. Dermatomyositis. 5. Interstitial lung disease. 6. Solitary kidney.  PAST SURGICAL HISTORY:  Port-A-Cath placement.  FAMILY HISTORY:  Noncontributory.  SOCIAL HISTORY:  Denies smoking, admits to occasional alcohol use.  Denies drug use.  Is married.  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  ALLERGIES:  SULFA DRUGS.  MEDICATIONS: 1. Duragesic 75 mcg q.72h. 2. Prednisone. 3. IVIG. 4. Oxycodone for breakthrough pain.  PHYSICAL EXAMINATION:  GENERAL:  A healthy appearing male in no acute distress.  EXTREMITIES:  Purplish discoloration in the index and middle fingers bilaterally with coolness to the touch.  This seems to fluctuate during the exam.  The patient has full range of motion of the upper extremities bilaterally.  Examination of the lower extremities reveals full range of motion without any heat, erythema, or edema, especially about the knees. There are no rashes, lesions, or ulcerations noted at this time in the skin upper and lower extremities.  Manual muscle testing is 5/5 bilateral upper and lower extremities in all muscle groups tested.  Sensory examination is intact to light touch bilateral upper and lower extremities.  Muscle stretch reflexes are 2+/4 bilateral biceps, triceps, brachial radialis, pronator teres, patellar, medial hamstrings, and Achilles.  IMPRESSION: 1. Chronic bilateral knee pain secondary to avascular necrosis due to oral    steroid therapy for lupus, dermatomyositis. 2. Systemic lupus. 3. Dermatomyositis. 4. Raynauds phenomenon. 5. History of narcotic prescription alteration.  RECOMMENDATIONS:  1. I had a long  discussion with [name] regarding treatment options.  I    reviewed limited records.  At this point, I have instructed [name] that    I will not be assuming the responsibility of writing his narcotic    prescriptions, and that he should continue to obtain these from his primary    care physician until I receive further records to enhance our database,    especially those from the pain clinic in [place].  I have instructed    [name] that even at that time I may not assume his care in this regard.    He states he is in somewhat of a dillema because he is set to go out of    town for the holiday weekend and he is out of his pain medications and    cannot get in to see Dr. Clent Ridges.  I explained to [name] that I would write    a breakthrough pain medication for him as a one time only, and he    understands.  I would recommend that his oxycodone be increased to    roxicodone 15 mg 1/2 to 1 p.o. b.i.d. as needed for breakthrough pain, and    I have written this for #30 tablets.  Again, he understands that this is a    one time only prescription until further records are obtained and  evaluated. 2. I also discussed other medications and therapies, including aquatic therapy    which I would recommend and have written a prescription for, range of    motion stretching, low to non-impact aerobic conditioning, and independent    program two to three times per week for four weeks. 3. Consider use of ______ cream to knees and hands as needed and tolerated. 4. I will set up an appointment with Dr. Tonny Bollman to evaluate him for further    stellate ganglion blockade and other options in this regard. 5. The patient is to return to clinic to see Dr. Tonny Bollman. 6. The patient is to follow up with his primary care physician.  The patient was educated on the above findings and recommendations and understands.  There were no barriers to communication. Dictated by:   Sondra Come, D.O. Attending  Physician:  Sondra Come DD:  05/24/02 TD:  05/28/02 Job: [id number]

## 2011-04-10 NOTE — Consult Note (Signed)
NAME:  [name], [name]                    ACCOUNT NO.:  [id number]   MEDICAL RECORD NO.:  [id number]                   PATIENT TYPE:  REC   LOCATION:  TPC                                  FACILITY:  [facility]   PHYSICIAN:  Zachary George, DO                      DATE OF BIRTH:  1971-02-25   DATE OF CONSULTATION:  12/08/2002  DATE OF DISCHARGE:                                   CONSULTATION   REASON FOR CONSULTATION:  The patient returns to clinic today for re-  evaluation.  He was last seen on November 10, 2002.  His pain is very well  controlled with Duragesic patch 100 mcg q.72h, Roxicodone 15 mg 2-3X per day  for break through pain and spinal cord stimulation for his Raynaud's  phenomenon.  He continues to be limited with activity secondary to knee pain  due to avascular necrosis.  He also mentions today that he has been having  more frequent episodes of severe sweating almost daily which tends to soak  his clothing.  This is limiting his ability to work on some days.  He states  the symptoms are worse with stress and caffeine.  His primary care physician  started him on Clonidine two to three months ago which he currently takes  0.1 mg three times per day which does not stop the severe sweating episodes.  He cannot tolerate higher doses than that secondary to sleepiness.  I  reviewed the health and history form and fourteen point review of systems.  Pain today is 3/10 on a subjective scale.  Function and quality of life  indices have remained somewhat declined.  His pain is well controlled.   PHYSICAL EXAMINATION:  The physical examination reveals a healthy male in no  acute distress.  Blood pressure 145/65, pulse 82, respirations 20. Oxygen  saturation 100% on room air.  Examination of the upper extremities reveals  purplish fingertips without any open lesions.  The patient has full range of  motion bilateral upper extremities.  Neurologically intact in upper and  lower  extremities including motor, sensory and reflexes.  No significant  diaphoresis noted at this time.   IMPRESSION:  1. Raynaud's phenomenon.  2. Avascular necrosis bilateral knees with chronic knee pain.  3. Systemic lupus.  4. Sweating episodes.   PLAN:  1. Continue Duragesic 100 mcg q.72h., #10 without refills.  This is to be     filled on or after December 27, 2002 as the patient currently has #6     patches from his previous prescription remaining.  He has questions in     regards to his mail order pharmacy which requests a three month refill on     his medications.  At this point I do not feel this is appropriate as     medications have changed over the past few months, however, if he remains  stable, this should not be a problem as patient has been very compliant     with his medications in this clinic.  He has concerns, however, of     patches being mailed to him.  He is still trying to work this out with     his insurance company.  2. Continue Roxicodone 15 mg one p.o. t.i.d. as needed for break through     pain, #90 without refills.  3. Patient is to remain in contact with his primary care physician and his     rheumatologist.  4. Patient is to return to clinic in 7 weeks for re-evaluation.   Patient was educated regarding the above findings and recommendations and  understands.  There were no barriers to communication.                                               Zachary George, DO    JW/MEDQ  D:  12/08/2002  T:  12/08/2002  Job:  [id number]   cc:   Tera Mater. Clent Ridges, M.D. [facility]  [address] [place]  [place] [address]  Fax: 1

## 2011-04-10 NOTE — Consult Note (Signed)
NAME:  [name], [name]                    ACCOUNT NO.:  [id number]   MEDICAL RECORD NO.:  [id number]                   PATIENT TYPE:  REC   LOCATION:  TPC                                  FACILITY:  [facility]   PHYSICIAN:  Hans C. Stevphen Rochester, M.D.                DATE OF BIRTH:  05/12/1971   DATE OF CONSULTATION:  DATE OF DISCHARGE:                           PAIN MANAGEMENT CONSULTATION   REASON FOR CONSULTATION:  [facility].  I evaluate him and review health and history form and 14  point review of systems.  I review available records, and personally examine  the patient.   The patient comes today with complaints of typical Raynaud's which he has  been experiencing for many years.  I review his past medical history and  records from [facility] which suggest difficult control of medication, and  there has been altered prescription in the past.  He is remorseful and  forthright with this, and I discuss the risks, complications, and options  with these medications as well as available treatments that we may offer  here.  He is familiar with stellate ganglion blocks which has helped him a  great deal in the past, and we will contemplate this.   I do think that the most unobtrusive, and most promising direct care with  the least amount of potential complications in this individual would be to  entertain dorsal collum stimulation in the cervical spine.  This would  probably increase blood flow, decrease sympathetic flow, and improve his  overall presentation.  I would like him to see Dr. Clint Lipps in [place] who  has significant experience with this approach, and obtain his opinion.   I would consider writing narcotic based pain medication as I discussed with  him, and develop the patient physician relationship in this regard, but it  most be built on trust.  He understands that we have a very strong  compliance regards to these  medications, and patient care agreement must be  followed very carefully.  He is seeing Dr. Clent Ridges now who is providing him with  his medications, and I would consider writing these medications if we find  the patient to be appropriate, and we would give him a second chance.  He  understands the potential for random drug screens, and pill counts, and is  aware of our overall directed approach.   He is instructed to maintain contact with Dr. Clent Ridges and his rheumatologist as  well.   PHYSICAL EXAMINATION:  EXTREMITIES:  His hands reveal discoloration and  typical features consistent with Raynaud's.  He has paracervical discomfort.  He does have some nicking in his forearm, and I doubt that this is from IV  drug abuse, but would maintain a high level of vigilance.  NEUROLOGIC:  No overt neurological deficits.  Motor, sensory, and reflexive.  I review his vascular examination which is  unremarkable at this time.   IMPRESSION:  1. Raynaud's syndrome.  2. Chronic pain syndrome.   PLAN:  1. As outlined above.  2. A stellate ganglion block will be entertained, and we will refer him to     Dr. Courtney Paris to assess the appropriateness for dorsal collum     stimulation.  3. We will see him in followup as well to determine whether we will accept     his narcotic prescriptions within this clinic within the context of the     patient-physician relationship and patient care agreement.   Extensive consultation in this regard.  Reviewed risks of medications,  overall procedure options, and directed care approach.  Lifestyle  enhancements discussed.  We will see him in followup.                                                Hans C. Stevphen Rochester, M.D.    [facility]  D:  06/27/2002  T:  07/02/2002  Job:  [id number]   cc:   Jeannett Senior A. Clent Ridges, M.D. [facility]

## 2011-04-10 NOTE — Discharge Summary (Signed)
NAME:  [name], [name]                    ACCOUNT NO.:  [id number]   MEDICAL RECORD NO.:  [id number]                   PATIENT TYPE:  INP   LOCATION:  5739                                 FACILITY:  [facility]   PHYSICIAN:  [name], M.D. [facility]          DATE OF BIRTH:  December 27, 1970   DATE OF ADMISSION:  02/26/2003  DATE OF DISCHARGE:  02/28/2003                                 DISCHARGE SUMMARY   DISCHARGE DIAGNOSES:  1. Fever.  2. Right lower lobe pneumonia.  3. Right pulmonary embolus.   BRIEF ADMISSION HISTORY:  The patient is a 40 year old white male who  presents with acute-onset right pleuritic chest pain.  This is associated  with shortness of breath.  He denies any cough.   PAST MEDICAL HISTORY:  1. Avascular necrosis of both knees.  2. Followed at the chronic pain clinic by Dr. Zachary Buchanan.  3. Raynaud's syndrome.  4. Anti-Jo neutropenia, in remission.  5. Chronic pain.  6. Interstitial lung disease.  7. Dermatomyositis.  8. Right PE diagnosed in February 2004.   HOSPITAL COURSE:  PULMONARY:  The patient presented with fever and right  pleuritic pain.  He was admitted to rule out progressive pulmonary embolus  and pneumonia.  The patient had a CT of his chest that revealed a persistent  right lower lobe PE but no new PE or progression.  He also had a small right  pleural effusion and right lower lobe atelectasis versus infiltrate.  The  patient was subtherapeutic on his Coumadin as well.  The patient was  empirically started on Rocephin.  He was also started on Lovenox in addition  to his Coumadin.  The patient did defervesce.  His white count remained  normal.  The patient's O2 saturations are greater than 96% on room air.  In  reviewing the patient's pro times, the patient's pro time on January 16, 2003 was 32; his INR was 7.1.  His Coumadin was held for three days and then  he was to resume at 5 mg daily.  The next pro time was on January 22, 2003; his  pro  time was 13 and his INR was 1.1.  At that time, his Coumadin was  increased to 7.5 mg on Monday and Friday.  On admission, the patient's pro  time was 19.8 and INR was 1.8.  We did, as noted, start the patient on  Lovenox to cover and continue his Coumadin.  Currently, his pro time is 26  and INR is 2.9.  The patient will need close followup by the Coumadin clinic  or by Dr. Tera Mater. Clent Buchanan regarding his Coumadin dosing.  I suggest the  patient have Coumadin/pro times drawn every week until stable.  The patient  is also on oral antibiotics at this time and we will have him continue for a  full 10-day course.   LABORATORY DATA AT DISCHARGE:  Pro time 26, INR of 2.9.  Blood cultures are  negative.  Urinalysis is negative.  Hemoglobin 12.5.  CMET was normal.   MEDICATIONS AT DISCHARGE:  1. Coumadin -- dose to be determined by pharmacy.  2. Prednisone 8 mg daily.  3. Fentanyl patch 100 mcg to be changed every three days.  4. Xanax 0.5 to 1 mg q.i.d. p.r.n.  5. Roxicodone p.r.n.  6. IVIG every three months.  7. Neurontin 300 mg t.i.d.  8. Ceftin 250 mg b.i.d. for eight days.   FOLLOWUP:  The patient has been instructed to follow up for a pro time on  March 01, 2003 and March 05, 2003 and then probably every week per Dr. Claris Buchanan  discussion.     [name], P.A. [facility]                  [name], M.D. [facility]    LC/MEDQ  D:  02/28/2003  T:  03/01/2003  Job:  [id number]   cc:   [name], M.D. [facility]

## 2011-04-10 NOTE — Consult Note (Signed)
NAME:  [name], [name]                    ACCOUNT NO.:  [id number]   MEDICAL RECORD NO.:  [id number]                   PATIENT TYPE:  INP   LOCATION:  4709                                 FACILITY:  [facility]   PHYSICIAN:  Zachary George, DO                      DATE OF BIRTH:  09/25/1971   DATE OF CONSULTATION:  01/22/2003  DATE OF DISCHARGE:  01/12/2003                                   CONSULTATION   REASON FOR CONSULTATION:  The patient returns to the clinic today for  reevaluation. He was  last seen on December 08, 2002. In the interim the  patient has been hospitalized for a large pulmonary embolus with infarction  of a portion of his right lung. He was discharged from the hospital on  January 12, 2003, with significant increase in his pain medications.   During his hospitalization I was notified by the physician's assistant who  was caring for him in regards to pain management. The patient apparently had  significant pain which was difficult to control. He is currently on  Duragesic 200 micrograms every 72 hours, Oxycontin 60 mg b.i.d., Oxycodone  20 mg q.6h. p.r.n. and Ultram 2 pills q.6h.  p.r.n.   In terms of his short-acting Oxycodone use he typically takes four 5-mg  pills three times per day, but feels that he can cut back on this somewhat.  He also continues to take Ultram 2 pills 3 times daily.   He continues to complain of pleuritic type chest pain which worsens with  taking a deep breath. He also continues to have bilateral knee pain and hand  pain secondary to Raynaud's. He is on short-term disability through the  month of March at this time. He has believes he will be treated with  Coumadin for a prolonged length of time.   His pain today is a 6/10 on a subjective scale. Function and quality of life  indices have declined. Sleep has been poor. I reviewed health and history  form of 14 point review of systems. The patient tells me that he has a  prescription for  20 Duragesic 100 microgram patches waiting for him at  [facility]. He has 40 Oxycodone 5-mg tablets left and 3 Oxycontin 20-mg tablets  left. I am hopeful that the patient's pain will decrease back to his  previous baseline, however, we have discussed other options if his symptoms  do not improve.   PHYSICAL EXAMINATION:  GENERAL:  A healthy appearing male in no acute  distress.  VITAL SIGNS:  Blood pressure 140/79 today.  EXTREMITIES:  No new physical examination findings in regards to the  patient's upper and lower extremities. He continues to have purplish  fingertips without any open lesions. Hands are cool to touch. The patient  has full range of motion in the upper extremities bilaterally.  NEUROLOGIC: Examination is unchanged in the upper and lower extremities.  IMPRESSION:  1. Systemic lupus.  2. Avascular necrosis bilateral knees with chronic knee pain.  3. Raynaud's phenomena.  4. Pleuritic chest pain secondary to massive pulmonary embolus.   PLAN:  1. The patient and I discussed treatment options. Again I am hopeful that we     can begin decreasing some of his pain medications slowly and get back to     his previous baseline of Duragesic 100 microgram every 72 hours with     Oxycodone for breakthrough pain. However, currently he is on much higher     doses of narcotic based pain medication secondary to his massive     pulmonary embolus, which according to his report has not dissolved     completely. He continues to have significant pain in this regard. I will     continue with Duragesic 100 microgram 2 every 72 hours for now. The     patient does not need a new prescription for this, as he has a     prescription waiting for him at the  pharmacy to pick up.  2. Continue Oxycontin 20 mg 3 pills every 12 hours, #90 without refills.  3. Will have the patient trial decreasing his  Oxycodone 5 mg to 3 pills 3     times a day as needed. We will likely decrease him further  at next  visit     in one week.  4. Continue Ultram for now.  5. The patient to return to the clinic in one week for reevaluation and     reassessment of current medication regimen.  6. Will have the patient followup with Dr. Tonny Bollman as well in 2 weeks.  7. If symptoms are not improving and we are unable to decrease his     medications without significant pain response, will consider evaluation     for intrathecal pump, although being on Coumadin therapy may be a barrier     to this.   The patient was educated about the findings and recommendations and  understands.  There were no barriers to communication.                                                  Zachary George, DO    JW/MEDQ  D:  01/22/2003  T:  01/22/2003  Job:  [id number]   cc:   Tera Mater. Clent Ridges, M.D. [facility]

## 2011-04-10 NOTE — Discharge Summary (Signed)
NAME:  [name], [name]                    ACCOUNT NO.:  [id number]   MEDICAL RECORD NO.:  [id number]                   PATIENT TYPE:  INP   LOCATION:  4709                                 FACILITY:  [facility]   PHYSICIAN:  Rene Paci, M.D. [facility]          DATE OF BIRTH:  May 08, 1971   DATE OF ADMISSION:  01/04/2003  DATE OF DISCHARGE:  01/12/2003                                 DISCHARGE SUMMARY   DISCHARGE DIAGNOSES:  1. Right pulmonary embolus.  2. Right lower lobe infarct.  3. Fever.  4. Leukocytosis.  5. Pneumonia.  6. Acute on chronic pain.   BRIEF ADMISSION HISTORY:  [name] is a 40 year old white male  who presented with a several day history of pleuritic chest pain.  He had  seen Dr. Tera Mater. Clent Ridges in the office on 01/03/2003.  Chest x-ray was  consistent with a right lower lobe pneumonia.  He was given some penicillin  in the office and given a prescription for Avalox.  After seeing Dr. Clent Ridges, he  developed increased right-sided chest pain with hemoptysis and dyspnea.  He  presented to the emergency room where CT was consistent with right-sided  pulmonary embolus.  No history of deep venous thrombosis or pulmonary  embolus.  No lower extremity edema or pain.  He has had fevers.   PAST MEDICAL HISTORY:  1. Lupus, steroid dependent.  2. Avascular necrosis of both knees.  3. Raynaud's syndrome.  4. Anti-Jo neutropenia, currently in remission.  5. Chronic pain.  6. Interstitial lung disease.   HOSPITAL COURSE:  Problem #1.  Pulmonary:  The patient as noted presented  with a pulmonary embolus in the right lower lobe pulmonary artery extending  into the segmental branches.  He had evidence of right lower lobe infarct.  The patient was admitted for anticoagulation.  Given his history of lupus,  we were concerned also about coagulopathy and we did obtain  antiphospholipids, antithrombin III, factor V Leyden, protein C and S.  The  patient was started on  heparin and Coumadin.  He completed seven days of  heparin and is currently therapeutic on Coumadin.  We did do a follow up CT  chest on 01/09/2003 which was essentially unchanged from his initial CT on  01/04/2003.  The patient's oxygen saturations are 98 to 100% on room air.  Problem #2.  Acute on Chronic Pain:  As noted, the patient has a history of  chronic pain and was on Fentanyl and Roxicodone prior to admission.  The  patient was in significant pain on arrival to the emergency department.  We  had a difficult time controlling the patient's pain.  We did consult Dr.  Andrey Campanile; however, Dr. Andrey Campanile does not come to the hospital.  We also called  Anesthesia, who stated that they were unable to help either.  Eventually, we  did ask pharmacy to help [place] with pain control and we came up with a regimen.  We increased the patient's Fentanyl patch to 200 mcg.  We also started the  patient on Fentanyl IV.  We were able to provide good pain control.  Instead  of using the patient's instant-relief Roxicodone, we started the patient on  OxyContin.  Currently his pain is managed with 200 mcg Fentanyl patch,  OxyContin 60 mg four times daily, Oxycodone 20 mg every six hours and Ultram  100 mg four times daily.  He will certainly need to follow up with Dr.  Andrey Campanile as soon as possible for further management of his pain and attempts  to wean him off of the pain medications.  Problem #3.  Fever:  The patient as noted presented with fevers and white  count which were most likely secondary to his pulmonary embolus; however, we  could not rule out pneumonia and we have given him a full ten day course of  Tequin.   LABORATORIES AT DISCHARGE:  His blood cultures were negative.  Pro-time  25.2, international normalized ratio 2.8.  Hemoglobin 11.6.  Antiphospholipids:  PTTLA were 35.  PTTLA confirmation was not applicable.  DRVTT was 43.  Lupus anticoagulant was not detected.  Factor V Leyden  mutation was no  mutation.  Genotype was normal.  Protein C was low at 48.  Protein S was normal at 85.  PHP haserine IgG was less than 16.  PHP  haserine IgM was less than 22.  PHP haserine IgA was less than 20.  Anticardiolipin IgA was less than 14.  Anticardiolipin IgG was less than 12.  Anticardiolipin IgM was less than 11.   MEDICATIONS AT DISCHARGE:  1. Prednisone 8 mg daily.  2. Neurontin 300 mg three times daily.  3. Coumadin 5 mg one and a half tablets daily.  4. Duragesic 100 mcg patch two patches to be changed every three days.  5. OxyContin 20 mg three tablets twice daily.  6. Ultram 50 mg two tablets four times daily.  7. Oxycodone IR 5 mg four tablets every six hours as needed.  8. Xanax 0.25 mg every eight hours p.r.n.  9. Ambien 10 mg at bedtime as needed.   FOLLOW UP:  The patient is to follow up with Dr. Clent Ridges on Tuesday 02/24 at  2:30 p.m.  He needs to follow up with Dr. Andrey Campanile next week as well and he  needs to make an appointment to Dr. Hermina Barters in the near future to determine  whether he needs to be on life-time Coumadin therapy or just the next six to  nine months.     [name], P.A. [facility]                  Rene Paci, M.D. [facility]    LC/MEDQ  D:  01/12/2003  T:  01/12/2003  Job:  [id number]   cc:   Dr. Audry Pili A. Clent Ridges, M.D. [facility]   Dr.  Hermina Barters

## 2011-04-10 NOTE — Assessment & Plan Note (Signed)
[facility]                                 ON-CALL NOTE   KIP, CROPP                 MRN:          [id number]  DATE:02/13/2007                            DOB:          [birth year]    TIME RECEIVED:  1:19pm.   TELEPHONE NUMBER:  [phone]   The patient is reporting continued symptoms of bronchitis and possibly  pneumonia. I saw him in the office about two weeks ago with bronchitis.  He was placed on a 10 day course of Ceftin which he finished about two  days ago. He felt like he was getting better although he was not  completely well. Now in the last two days, his symptoms have returned  again. He now has chest congestion, wheezing, a deep unproductive cough,  and fatigue. There is no fever. My response is to call in a 21 day  course of Ceftin 500 mg b.i.d. to [facility] at [phone]. I also called in  Histinex HC to use as needed for cough, 240 cc with no refills. I told  the patient that he could see me for follow up this coming week if he  does not respond.     Tera Mater. Clent Ridges, MD  Electronically Signed    SAF/MedQ  DD: 02/13/2007  DT: 02/14/2007  Job #: [id number]

## 2011-04-10 NOTE — Op Note (Signed)
NAME:  [name], [name]          ACCOUNT NO.:  [id number]   MEDICAL RECORD NO.:  [id number]           PATIENT TYPE:   LOCATION:                                 FACILITY:   PHYSICIAN:  Leslye Peer, M.D.       DATE OF BIRTH:   DATE OF PROCEDURE:  03/17/2006  DATE OF DISCHARGE:                                 OPERATIVE REPORT   PROCEDURE:  Fiberoptic bronchoscopy with bronchoalveolar lavage.   OPERATOR:  Leslye Peer, M.D.   INDICATION:  Right upper lobe and lingular infiltrates.   MEDICATIONS GIVEN:  Phenergan 25 mg IV in divided doses, Fentanyl 200 mcg IV  in divided doses, Versed 10 mg IV in divided doses, 1% lidocaine topically  to the bronchial tree for a total of 15 mL.   PROCEDURE DETAILS:  Consent was obtained, from the patient and a signed copy  is on his hospital chart.  The consultation and local anesthesia were  initiated as indicated above.  The fiberoptic bronchoscope was introduced  into the right nare without difficulty.  The glottis was well visualized,  the cords moved normally.  Trachea was intubated and was normal in  appearance.  Local anesthesia was with 1% lidocaine.  The left mainstem  bronchus, left upper lobe lingular, and lower lobe bronchi were inspected  and were all within normal limits without evidence of endobronchial lesions  or secretions.  The right mainstem bronchus had some thick white mucous that  was easily suctioned.  This appeared to be emanating from the right upper  lobe and right middle lobe bronchi; and to a lesser extent the right lower  lobe bronchi.  These secretions were easily cleared by suctioning.  Inspection of the airways then revealed no abnormalities.  There, again,  were no endobronchial lesions.  Bronchioalveolar lavage was performed from  the right upper lobe with approximately 90 mL of normal saline instilled and  10-15 mL returned.  This will be sent for bacterial, fungal, mycobacterial  cultures and also for  direct fluorescence antibody testing for PCP.  The  patient tolerated the procedure well.  There were no obvious complications;  and there was no blood loss.  He returned to the recovery room in good  condition.   SAMPLES:  Bronchioalveolar lavage from the right upper lobe sent for  microbiology, as detailed above.   PLANS:  We will follow up [name]'s cultures when they become available.           ______________________________  Leslye Peer, M.D.     RSB/MEDQ  D:  03/17/2006  T:  03/18/2006  Job:  [id number]

## 2011-04-10 NOTE — Consult Note (Signed)
NAME:  [name], [name] NO.:  [id number]   MEDICAL RECORD NO.:  [id number]                   PATIENT TYPE:  REC   LOCATION:                                       FACILITY:   PHYSICIAN:  Zachary George, DO                      DATE OF BIRTH:  [birth year]   DATE OF CONSULTATION:  02/20/2003  DATE OF DISCHARGE:                                   CONSULTATION   HISTORY OF PRESENT ILLNESS:  The patient returns to the clinic today for  reevaluation.  He was also seen on 02/09/03.  He is essentially back on his  previous level of medications to include Duragesic 100 mcg q.72h. and has  six patches remaining.  He is also taking Roxicodone 15 mg three to four  times per day and Neurontin 300 mg t.i.d.  He has been using Xanax 0.25 mg  three to four times per day for extensive sweating.  He has 16 Xanax  remaining.  He only has three to four Roxicodone remaining as well.  He  brings his pills with him for appropriate pill count.  His pain today is  6/10 on subjective scale.  He continues to have some right-sided chest pain  secondary to his massive pulmonary embolus.  However, overall, his function  of quality of life indices have improved.  His sleep is good.  He continues  using the spinal cord stimulator for his Raynaud's phenomenon.  I reviewed  health and history form and 14 point review of systems.   PHYSICAL EXAMINATION:   GENERAL:  Healthy male in no acute distress.   VITAL SIGNS:  Blood pressure 144/77, pulse 110, respirations 20, O2  saturation 99%.   EXTREMITIES:  No hyperhidrosis is noted.  The patient continues to have  fluctuating Raynaud's changes in his fingers bilaterally.   IMPRESSION:  1. Raynaud's phenomenon.  2. Avascular necrosis bilateral knees with chronic knee pain.  3. Systemic lupus.  4. Pulmonary embolus currently being treated by pulmonology.   PLAN:  1. Continue Duragesic 100 mcg one q.72h. # 10 without refills.  This should   be filled on or after 03/11/03.  2. Continue Roxicodone 15 mg one p.o. t.i.d. to q.i.d. as needed # 120     without refills.  3. Continue Xanax 0.25 mg one p.o. t.i.d. to q.i.d. as needed # 100 without     refills.  4. Continue Neurontin.  5. We will give a trial of Lidoderm 5% patches and I have provided him with     five sample boxes.  The patient to call our clinic if helpful for a     prescription.  6. The patient to return to clinic in one month for reevaluation.    DISCUSSION:  The patient was educated on the above findings and  recommendations and understands.  No barriers to communication.  Zachary George, DO    JW/MEDQ  D:  02/20/2003  T:  02/21/2003  Job:  [id number]   cc:   Tera Mater. Clent Ridges, M.D. [facility]

## 2011-04-10 NOTE — Telephone Encounter (Signed)
Spoke with pt; he is aware and is currently looking through his folder to make sure he did not overlook the prescriptions.  Pt will call back if he happens to find them.

## 2011-04-10 NOTE — Consult Note (Signed)
NAME:  [name], [name]                    ACCOUNT NO.:  [id number]   MEDICAL RECORD NO.:  [id number]                   PATIENT TYPE:  REC   LOCATION:  TPC                                  FACILITY:  [facility]   PHYSICIAN:  Zachary George, DO                      DATE OF BIRTH:  Feb 19, 1971   DATE OF CONSULTATION:  11/10/2002  DATE OF DISCHARGE:                                   CONSULTATION   REASON FOR CONSULTATION:  The patient returns to clinic today for  reevaluation.  He was last seen on October 17, 2002.  He continues to get  fairly good pain control of his hands with the spinal cord stimulator.  Because of the Raynaud's, he still gets fluctuations, especially worsening  with cold weather, and today is a fairly cold day outside.  He also  continues to have significant pain in his knees secondary to avascular  necrosis requiring Duragesic 100 mcg q.72h. with Roxicodone 15 mg two to  three times per day for breakthrough pain.  His pain today is a 6/10 on a  subjective scale.  He is satisfied with his current level of pain control.  His function and quality-of-life indices have improved significantly.  I  reviewed health and history form and 14-point review of systems.  He  continues working.   PHYSICAL EXAMINATION:  GENERAL:  Physical exam reveals a healthy male in no  acute distress.  VITAL SIGNS:  Blood pressure 134/69, pulse 89, respirations 20, O2  saturation is 100% on room air.  EXTREMITIES:  The patient has mottling of his hands.  They are cold to the  touch, color changes alternating between white, pink and purple.  The  patient does not have any open lesions on his fingertips.  In regards to the  patient's knee, he has full range of motion without any effusions.  There is  minimal tenderness if any with palpation of the knees bilaterally.   IMPRESSION:  1. Raynaud's phenomenon, improved.  2. Avascular necrosis, bilateral knees, with chronic knee pain.  3. Systemic  lupus.   PLAN:  1. Continue Duragesic 100 mcg q.72h., #10 without refills.  2. Continue Oxy-IR 15 mg one p.o. t.i.d. as needed for breakthrough pain.     The patient's pain medication should last until December 15, 2002.  3.     Suggest aquatic therapy; the patient will look into this.  4. Patient to return to clinic in one month for reevaluation.   Patient was educated in the above findings and recommendations and  understands.  No barriers to communication.                                               Zachary George, DO    JW/MEDQ  D:  11/10/2002  T:  11/11/2002  Job:  [id number]   cc:   Tera Mater. Clent Ridges, M.D. [facility]  [address] [place]  [place] [address]  Fax: 1

## 2011-04-10 NOTE — Assessment & Plan Note (Signed)
[facility]                             PULMONARY OFFICE NOTE   [name], [name]                 MRN:          [id number]  DATE:12/24/2006                            DOB:          [birth year]    PULMONARY CONSULTATION:   PROBLEM:  Sleep medicine at the kind request of Dr. Haroldine Laws for this 40-  year-old man concerned about possible obstructive sleep apnea.   HISTORY:  He is followed by Dr. Clent Ridges and by a rheumatologist and  pulmonologist at [facility] for a complicated medical history.  The issue for  this visit is very loud snoring aggravated by a deviated septum and  weight gain.  He blames his medication for weight gain.  He and his wife  are now in separate rooms.  He is making an effort to get his weight  back down and with that is snoring somewhat less than he had been.  He  has a discomfort in his nose that seems to be a sense of congestion  without sneeze.  He wakes frequently at night because of pain related to  his medical problems.  Bedtime is between 11 p.m. and midnight with  sleep latency usually 10-20 minutes.  He estimates that he wakes 4-6  times during the night and finally gets up between 9:30 and 10 a.m.  He  is not being told that he stops breathing.  Daytime tiredness is common  but not always oppressive.   MEDICATIONS:  1. Prednisone 10 mg.  2. Plaquenil 200 mg.  3. CellCept 200 mg.  4. Xanax 1 mg up to 5 times daily.  5. Dilaudid intrathecal pump at a basal rate of 9.814 mg per day,      bolus 1 mg every 4 hours p.r.n.  6. Medtronic stimulator.  7. Prilosec.  8. Norvasc.  9. Saline nasal spray.  10.Ambien.  11.Phenergan used p.r.n.   DRUG INTOLERANCE:  Sulfa.   REVIEW OF SYSTEMS:  Snoring.  A little difficulty initiating sleep.  Waking fatigued.  Difficulty maintaining sleep.  Dozing off in the  evening.  Cough at night, paroxysms.  Dyspnea.  Chronic pain.  The  Medtronic stimulator is to control Raynaud  symptoms.   MEDICAL HISTORY:  1. Systemic lupus.  2. Raynaud's.  3. 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  4. Neutropenia.  5. Pulmonary embolism, 2004.  6. Avascular necrosis.  7. Interstitial lung disease.  8. Exercise-induced asthma in the past.  9. Sinus congestion.  10.Tonsillectomy.  11.A remote sinus procedure.  He is not really sure what was done.  12.Allergy skin testing and allergy vaccine as a child.   The pulmonary embolism had been managed at [facility] but because of  insurance he now gets most of his care at [facility].   SOCIAL HISTORY:  Never smoked.  Quit alcohol 5 years ago.  Married with  children.  He is totally disabled.  He had worked as a Scientist, research (physical sciences).   FAMILY HISTORY:  Mother with allergies and snores.   OBJECTIVE:  VITAL SIGNS:  Weight 203 pounds, BP 112/72, pulse regular  and  90, room air saturation 97%.  GENERAL:  This is an alert man, medium build.  SKIN:  No rash.  ADENOPATHY:  None at the neck, shoulders or axillae.  HEENT:  There is an intermittent tic of his left eye.  Minimal nasal  stuffiness.  No visible polyps or postnasal drip.  Palate spacing is  3/4.  Tonsils are present.  NECK:  There is no neck vein distention or stridor.  CHEST:  Heart sounds are regular without murmur or gallop.  Fine  crackles are present to the inferior scapula bilaterally.  EXTREMITIES:  There is no cyanosis, clubbing or edema.   IMPRESSION:  1. The immediate concern is whether he may have obstructive sleep      apnea.  2. Nasal discomfort that may reflect persistent rhinitis.  3. Lupus with chronic pain secondary to avascular necrosis from      chronic steroid therapy.  4. Interstitial pulmonary fibrosis attributed to his lupus.  5. History of a large pulmonary embolism, which he emphasizes has been      retained.  Not on Coumadin.   PLAN:  1. We will schedule a nocturnal polysomnogram with split protocol at      the [facility] as discussed.  2. We have reviewed the physiology and medical concerns of obstructive      sleep apnea and discussed the basics of good sleep hygiene.  3. Try sample of Veramyst once each nostril.  4. Try saline gel.  5. Encourage some weight loss if possible.   His connective tissue and pulmonary fibrosis problems are being followed  at [facility].  Schedule return after his sleep study.  I appreciate the  chance to meet him.     Clinton D. Maple Hudson, MD, Tonny Bollman, FACP  Electronically Signed    CDY/MedQ  DD: 12/25/2006  DT: 12/25/2006  Job #: [id number]   cc:   Hermelinda Medicus, M.D.  Tera Mater. Clent Ridges, MD  [facility]

## 2011-04-10 NOTE — H&P (Signed)
NAME:  [name], [name]          ACCOUNT NO.:  [id number]   MEDICAL RECORD NO.:  [id number]          PATIENT TYPE:  AMB   LOCATION:  SDS                          FACILITY:  [facility]   PHYSICIAN:  Hermelinda Medicus, M.D.   DATE OF BIRTH:  11-08-1971   DATE OF ADMISSION:  DATE OF DISCHARGE:                              HISTORY & PHYSICAL   This patient is a 40 year old male who has some considerable respiratory  issues which are secondary to his pulmonary fibrosis which is secondary  to his lupus.  He also has secondary avascular necrosis secondary to  chronic steroid therapy.  He also has interstitial pulmonary fibrosis,  which is attributed to his lupus, and he has also had a history of a  large pulmonary embolism.  His immediate concern here is that of sleep  apnea, and he is followed by the rheumatologist and pulmonologist at  [facility].  He enters with a history of having had septal surgeries several  years ago secondary to trauma and does have a small septal perforation.  He blames his medications for his weight gain and in the past does weigh  87.6 kilos.  He has, in the past, weighed 203 pounds, and he states that  he has lost some of this weight.  With his difficult medical situation,  he had a sleep study which showed an RDI of 18, a O2 nadir of 84, but  his deep sleep REM 3 and 4 was only 6% of the time, considering that  normal is approximately 30.  With this, he comes now for a laser-  assisted uvulopalatoplasty under local MAC anesthesia, as we will be as  conservative as possible, and we will leave him overnight for  observation to see if we can gain some better sleep status.   PAST HISTORY:  He does drink occasionally.  He has never smoked.  He has  had a history of adenoidectomy, nasal surgery, intrathecal pump  insertion, and right hip simulator to control the Raynaud's, and he has  had a left knee arthroscopy.   MEDICAL HISTORY:  COPD with an interstitial lung disease,  which is  chronic, and a pulmonary embolism in 2003.  His systemic lupus  erythematosus is what is cared for down at [facility].  He does also have a  history of reflux esophagitis.  He also has a history of one kidney.  He  does have all joint pain and uses medication for this, primarily  Dilaudid intrathecal pump, at a basilar rate of 9.814 mg per day.   FUTHER MEDICATIONS:  1. CellCept 1000 mg daily.  2. Plaquenil 200 mg daily.  3. Prednisone 5 in the a.m. and 5 in the p.m. daily.  4. Alprazolam 1 mg 5 times daily.  5. Ambien h.s. p.o.  6. Norvasc 10 mg daily.  7. Omeprazole 20 mg daily.  8. Promethazine 25 mg daily.  9. Dilaudid intrathecal pump, above-mentioned.  10.Advair 250/50.   PHYSICAL EXAMINATION:  VITAL SIGNS:  Reveals a blood pressure of 127/74,  His heart rate is 93.  HEENT:  Ears are clear.  The tympanic membranes are clear.  The nose  shows a history of septal surgery and a septal perforation.  His oral  cavity is fairly small with a large uvula and a very low palatal  contour.  The larynx is clear.  True cords, false cords, epiglottic  space, and tongue are clear.  NECK:  Free of any thyromegaly, cervical adenopathy, or mass.  CHEST:  Clear.  No rales, rhonchi, or wheezes, but there is a very  slight rhonchi at the bases.  ABDOMEN:  Has been completed.  EXTREMITIES:  Again as mentioned.   DIAGNOSIS:  1. Mild snoring with just borderline concern of sleep apnea with lack      of REM deep sleep and chronic fatigue.  2. Systemic lupus erythematosus with history of nasal surgery, history      of chronic pain due to avascular necrosis and chronic steroid      therapy.  3. Interstitial pulmonary fibrosis.  4. History of pulmonary embolism.  5. History of left knee surgery.  6. History of right hip simulator to control Raynaud's.           ______________________________  Hermelinda Medicus, M.D.     JC/MEDQ  D:  03/21/2007  T:  03/21/2007  Job:  [id number]   cc:    Jeannett Senior A. Clent Ridges, MD  Clinton D. Maple Hudson, MD, FCCP, FACP  Verta Ellen, MD

## 2011-04-10 NOTE — Consult Note (Signed)
   NAME:  [name]                    ACCOUNT NO.:  [id number]   MEDICAL RECORD NO.:  [id number]                   PATIENT TYPE:  REC   LOCATION:  TPC                                  FACILITY:  [facility]   PHYSICIAN:  [name], M.D.                DATE OF BIRTH:  [birth year]   DATE OF CONSULTATION:  10/17/2002  DATE OF DISCHARGE:                                   CONSULTATION   1. [name] comes into pain management today after successful     completion of placement of dorsal column stimulation. This was performed     by Dr. Tyrone Sage, with improvement of blood flow, less notable pain, and he     has improved vascular capacity at different temperatures. This has been a     success.  2. He understands how to utilize this unit and brings his programmer.  3. Instructed to maintain contact with Dr. Tyrone Sage as he convalesces.  4. I reviewed his medications, and we will start tapering him off his OxyIR     breakthrough, and I discussed this with him. Duragesic will be maintained     at 100 mcg. He understands the habituating nature of these medications,     accepts the risks, and I reviewed patient care agreement. We will go     ahead and hopefully move to just a p.r.n. breakthrough and decrease     Duragesic over time.    OBJECTIVE:  His hands have improved. They are pink and appear to have good  blood flow even though today is fairly breezy and cold day. He has good  capillary filling. We have seen improvement here. He has no other  neurological deficit. He does have a myofascial component, suprascapular.   IMPRESSION:  Raynaud's phenomena, improved.   PLAN:  Conservative management. Followup in three weeks as we follow his  progress and move away from narcotic based pain medications to p.r.n.  approach. He will probably need some background narcotic at some point, but  we will monitor usage patterns in appropriate directive care approach.  States no wish to harm  self or others. Extensive consultation in this  regard. Nursing also reviewed these medications.                                               [name], M.D.    [facility]  D:  10/17/2002  T:  10/17/2002  Job:  [id number]

## 2011-04-10 NOTE — Op Note (Signed)
NAME:  [name], [name]          ACCOUNT NO.:  [id number]   MEDICAL RECORD NO.:  [id number]          PATIENT TYPE:  OBV   LOCATION:  2550                         FACILITY:  [facility]   PHYSICIAN:  Hermelinda Medicus, M.D.   DATE OF BIRTH:  1971/01/19   DATE OF PROCEDURE:  03/21/2007  DATE OF DISCHARGE:                               OPERATIVE REPORT   PREOPERATIVE DIAGNOSIS:  Lower uvula edema with redundant palate, with  sleep apnea, with history of septal surgery and history of septal  deviation, and history of lupus erythematosus, history of interstitial  lung disease chronic disease, and pulmonary embolus.   POSTOPERATIVE DIAGNOSIS:  Lower uvula edema with redundant palate, with  sleep apnea, with history of septal surgery and history of septal  deviation, and history of lupus erythematosus, history of interstitial  lung disease chronic disease, and pulmonary embolus.   OPERATION:  Laser-assisted uvulopalatoplasty under local MAC anesthesia,  1% Xylocaine with epinephrine and topical Cetacaine.   SURGEON:  Hermelinda Medicus, M.D.   ANESTHESIA:  Local MAC with Dr. Ivin Booty.   The patient was aware of the risks and gains, trying to gain some  improved airway and get a better sleep status.  His sleep study showed  an 18 RDI, a lowest O2 was 84% and his deep sleep was only 6% of the  time.  After prepping and draping and protecting the eyes, and  anesthetizing his palate, the CO2 laser was used, set at 10 watts, and  for six minutes we made lateral incisions on each side of the uvula,  increasing the height of the palate and also the uvula was very  redundant and increased in size secondary to his snoring, and this was  trimmed back to a normal status.  Once this was completed, the patient  did very well, had no problem with his airway, and will be observed  overnight, however, for his sleep apnea issues.  The patient is doing  well postoperatively and his followup will be in five days, ten  days,  three weeks, six weeks, three months, six months, and a year.           ______________________________  Hermelinda Medicus, M.D.     JC/MEDQ  D:  03/21/2007  T:  03/21/2007  Job:  [id number]   cc:   Verta Ellen, MD  Tera Mater. Clent Ridges, MD  Clinton D. Maple Hudson, MD, FCCP, FACP

## 2011-04-10 NOTE — Telephone Encounter (Signed)
Pt has lost his rxs of Generic Adderall for May and June. Please advise. He says that he is out of meds. He took his last pill today.

## 2011-04-10 NOTE — Assessment & Plan Note (Signed)
[facility]                             PULMONARY OFFICE NOTE   [name], [name]                 MRN:          [id number]  DATE:02/17/2007                            DOB:          1971-02-02    PROBLEM:  1. Obstructive sleep apnea.  2. Lupus.  3. Raynaud's.  4. Pulmonary fibrosis.  5. Bone infarction with chronic bone pain.  6. History of pulmonary embolism.   HISTORY:  Recent upper respiratory infection treated by Dr. Clent Ridges, and now  beginning to improve.  [name] returns after his sleep study on January 18, 2007, which confirmed moderate obstructive apnea with an index of 18.2  per hour, moderate snoring, and desaturation of 84%.  Successful CPAP  titration to 10 CWP for an index of 0.  We discussed, again, the  physiology, medical concerns, and available treatments for sleep apnea.  [name] knows that [name] has septal deviation.  [name] reminds me of his pain  control and fusion pump in the right chest wall.  [name] is usually a poor  sleeper because of somatic discomforts.  [name] was not sure that [name] could  tolerate CPAP on top of his other discomforts, and wanted to talk again  with Dr. Haroldine Laws.   MEDICATION:  1. Prednisone 10 mg.  2. Plaquenil 200 mg.  3. Cellcept 200 mg.  4. Xanax 1 mg up to 5 times daily.  5. Dilaudid intrathecal pump with a basal rate of 9.814 mg per day,      and bolus 1 mg q.4 h. p.r.n.  6. Medtronic stimulator.  7. Prilosec.  8. Norvasc.  9. Saline nasal spray.  10.Ambien.  11.Ceftin, taken recently.   Drug intolerant to SULFA.   OBJECTIVE:  Weight 200 pounds, BP 115/76, pulse regular 108, room air  saturation 92%.  There is mild nasal congestion, and [name] looks watery in the eyes,  consistent with a recent URI.  CHEST EXAM:  Reveals diffuse crackles and dry cough.  No pleural rub.  Heart sounds are regular without murmur.   IMPRESSION:  1. Moderate obstructive sleep apnea, index 18.3 per hour.  Responsive      to  CPAP, but with his concern that [name] may not tolerate an      additional device.  2. Lupus with pulmonary fibrosis.  3. Chronic pain.   PLAN:  1. We discussed sleep hygiene and management options for his sleep      apnea as above.  2. [name] is going back to Dr. Haroldine Laws.  I will be happy to see him again      p.r.n.     Clinton D. Maple Hudson, MD, Tonny Bollman, FACP  Electronically Signed    CDY/MedQ  DD: 02/19/2007  DT: 02/19/2007  Job #: [id number]   cc:   Jeannett Senior A. Clent Ridges, MD  Hermelinda Medicus, M.D.

## 2011-04-10 NOTE — Op Note (Signed)
NAME:  NOAH, LEMBKE                    ACCOUNT NO.:  [id number]   MEDICAL RECORD NO.:  [id number]                   PATIENT TYPE:  OIB   LOCATION:  2866                                 FACILITY:  [facility]   PHYSICIAN:  Abigail Miyamoto, M.D.              DATE OF BIRTH:  06-Aug-1971   DATE OF PROCEDURE:  04/17/2004  DATE OF DISCHARGE:  04/17/2004                                 OPERATIVE REPORT   PREOPERATIVE DIAGNOSIS:  Bilateral axillary lymphadenopathy.   POSTOPERATIVE DIAGNOSIS:  Bilateral axillary lymphadenopathy.   PROCEDURE:  Excisional biopsy of right axillary lymph nodes.   SURGEON:  Abigail Miyamoto, M.D.   ANESTHESIA:  General endotracheal anesthesia and 0.25% Marcaine.   ESTIMATED BLOOD LOSS:  Minimal.   PROCEDURE IN DETAIL:  The patient was brought to the operating room and  identified as Filomena Jungling.  He was placed supine on the operating  table and general anesthesia was induced.  His right axilla was the prepped  and draped in the usual sterile fashion.  A small longitudinal incision was  then made in the axilla with a #15 blade.  The incision was carried down  through the axillary fascia with electrocautery.  A large lymph node with  another separate node were found en masse deep in the axilla and grasped  with an Allis clamp and elevated.  They were then dissected free  circumferentially with the electrocautery.  The lymph node mass was then  sent to pathology for identification.  The wound was probed further and no  other enlarged adenopathy was identified.  The wound was then thoroughly  irrigated with normal saline.  The axillary fascia and subcutaneous layer  was then closed with interrupted 3-0 Vicryl sutures and the skin was closed  with running 4-0 Monocryl.  Steri-Strips, gauze, and tape were then applied.  The patient tolerated the procedure well.  All sponge, needle, and  instrument counts were correct at the end of the procedure.  The  patient was  then extubated in the operating room and taken in stable condition to the  recovery room.  Prior to closure of the wound, it was anesthetized with the  0.25% Marcaine.  The patient was then extubated in the operating room and  taken in stable condition to the recovery room.                                               Abigail Miyamoto, M.D.    DB/MEDQ  D:  04/17/2004  T:  04/18/2004  Job:  [id number]

## 2011-04-10 NOTE — Procedures (Signed)
NAME:  [name], [name]          ACCOUNT NO.:  [id number]   MEDICAL RECORD NO.:  [id number]          PATIENT TYPE:  OUT   LOCATION:  SLEEP CENTER                 FACILITY:  [facility]   PHYSICIAN:  Clinton D. Maple Hudson, MD, FCCP, FACPDATE OF BIRTH:  1971/03/12   DATE OF STUDY:  01/18/2007                            NOCTURNAL POLYSOMNOGRAM   INDICATION FOR STUDY:  Hypersomnia with sleep apnea.   EPWORTH SLEEPINESS SCORE:  14/24, BMI 32, weight 200 pounds.   MEDICATIONS:  Home medications are listed and reviewed, significant  especially for Xanax 1 mg and for a Dilaudid intrathecal pump and  Medtronic stimulator.   SLEEP ARCHITECTURE:  Total sleep time 368 minutes with sleep efficiency  82 minutes.  Stage 1 was 7%, stage II 87%, stages III and IV absent, REM  6% of total sleep time.  Sleep latency 26 minutes, REM latency 96  minutes, awake after sleep onset 59 minutes, arousal index 11.6.  Ambien  and Xanax were taken at 2155 hours.   RESPIRATORY DATA:  Split study protocol.  Apnea-hypopnea index (AHI,  RDI) 18.2 obstructive events per hour, indicating moderate obstructive  sleep apnea/hypopnea syndrome before CPAP.  This included 8 obstructive  apneas and 30 hypopneas before CPAP.  Events were not positional.  REM  AHI 64.2 per hour.  CPAP was titrated to 10 CWP, AHI 0 per hour.  A  small Respironics Comfort Fusion mask was used, with his comment that it  seemed to press into his face.   OXYGEN DATA:  Moderate to loud snoring with oxygen desaturation to nadir  of 84%.  Mean oxygen saturation with CPAP control was 93% on room air.   CARDIAC DATA:  Normal sinus rhythm.   MOVEMENT-PARASOMNIA:  Occasional limb jerk, insignificant.   IMPRESSIONS-RECOMMENDATIONS:  1. Moderate obstructive sleep apnea/hypopnea syndrome, apnea-hypopnea      index 18.2 per hour with nonpositional events, moderate to loud      snoring and oxygen desaturation to a nadir of 84%.  2. Successful CPAP titration to  10 CWP, apnea-hypopnea index 0 per      hour.  A small Respironics Comfort      Fusion mask was used.  Patient commented that the mask seemed to      press into his face and was concerned because he travels a lot,      expressing interest in surgical options as an alternative.      Clinton D. Maple Hudson, MD, [facility], FACP  Diplomate, Biomedical engineer of Sleep Medicine  Electronically Signed     CDY/MEDQ  D:  01/23/2007 10:46:03  T:  01/23/2007 15:44:08  Job:  [id number]

## 2011-04-10 NOTE — Consult Note (Signed)
NAME:  [name], [name]                    ACCOUNT NO.:  [id number]   MEDICAL RECORD NO.:  [id number]                   PATIENT TYPE:  REC   LOCATION:  TPC                                  FACILITY:  [facility]   PHYSICIAN:  Zachary George, DO                      DATE OF BIRTH:  1970-12-12   DATE OF CONSULTATION:  02/09/2003  DATE OF DISCHARGE:                                   CONSULTATION   HISTORY OF PRESENT ILLNESS:  The patient returns to the clinic today sooner  than scheduled.  He was last seen by me on January 22, 2003.  He was seen by  Dr. Stevphen Buchanan three days ago.  I had set that appointment up for him to discuss  possible intrathecal pump delivery system for his analgesia.  Dr. Stevphen Buchanan,  however, modified his medication regimen instead, dropping him from  Duragesic 200 mcg to 100 mcg for six days and then with the plan to drop  down to 50 mcg following that.  He also decreased his OxyContin from 60 mg  b.i.d. to 40 mg b.i.d. and has continued him on Roxicodone 15 mg three times  daily and then eventually down to b.i.d.  The patient called the clinic  yesterday complaining of severe sweating with the OxyContin.  He states that  15 minutes after he takes OxyContin dose he breaks out into severe sweats.  He would like to get off of the OxyContin altogether and remain on his  previous regimen of Duragesic 100 mcg every three days in addition to  Roxicodone.  I discussed this with him.  It is reasonable to try to modify  his medications to his previous that he was on prior to his hospitalization  for pulmonary embolus as long as his pain is well controlled.  His pain is  rated as a 7/10 on a subjective scale.  Functional and quality of life  indices remain stable.  In addition to Duragesic and oxycodone, the patient  is also taking Neurontin daily.  He previously was started on Xanax, which  helped his sweating.  I reviewed the health and history form and 14-point  review of systems.   PHYSICAL EXAMINATION:  VITAL SIGNS:  Blood pressure 115/81, pulse 97,  respirations 20, O2 saturation 99%.  EXTREMITIES:  No significant changes in physical examination of the upper  extremities.  The patient continues to have purplish discoloration of  fingertips which are cool to the touch.   IMPRESSION:  1. Raynaud's phenomenon.  2. Avascular necrosis bilateral knees with chronic knee pain.  3. Systemic lupus.   PLAN:  1. I discussed treatment options with the patient in terms of decreasing his     narcotic regimen while trying to avoid withdrawal symptoms.  At this     point I will continue him on Duragesic 100 mcg, 1 q.72h., #10 without     refills.  The patient does have one patch remaining.  2. Continue Roxicodone 15 mg, 1 q.4-6h. as needed.  3. Discontinue OxyContin.  4. Continue Neurontin, which should help also with any potential withdrawal     symptoms.  5. Will begin clonidine 0.1 mg, 1 p.o. b.i.d. x1 week, then 1 p.o. daily,     #21 without refills.  6. Xanax 0.25 mg, 1 p.o. three times daily as needed, #60 without refills.  7. Patient instructed to call the on-call physician this weekend if he runs     into any difficulty.  8. He is instructed to keep his OxyContin in case we go back to that drug at     a later date if his symptoms are not controlled.  He is instructed not to     give it away or sell it.  He understands.  9. Patient to return to clinic in two weeks or sooner as needed for     reevaluation.   The patient was educated on the above findings and recommendations and  understands.  There were no barriers to communication.                                               Zachary George, DO    JW/MEDQ  D:  02/09/2003  T:  02/09/2003  Job:  [id number]   cc:   Tera Mater. Clent Ridges, M.D. [facility]

## 2011-04-10 NOTE — Op Note (Signed)
NAME:  [name], [name] NO.:  [id number]   MEDICAL RECORD NO.:  [id number]                   PATIENT TYPE:  INP   LOCATION:                                       FACILITY:   PHYSICIAN:  Celene Kras, MD                     DATE OF BIRTH:  April 25, 1971   DATE OF PROCEDURE:  02/06/2003  DATE OF DISCHARGE:                                 OPERATIVE REPORT   SUBJECTIVE:  The patient comes in to pain management today and I evaluated  him with the health and history form of 14 point review of systems. I  reviewed the chart, reviewed medications, reviewed risks and patient care  agreement.   1. In the  interim I have discussed him with Dr. Tyrone Sage and we have had a     successful dorsal column stimulator implant with improvement in     functional indices and quality of life indices. His hands feel better, he     is able to improve his grip strength, and actually improve his range of     motion. I consider this basically a success. He looks real good here.  2. Problematic as he has had a rather large pulmonary embolism, probably     related to his underlying inflammatory disease. After this pulmonary     embolism he was placed on high-dose narcotics and then sent back to [place].     As I review Dr. Tawana Scale notes, who is away today on a family emergency,     I noted that he is bringing him down on his narcotics, and I think this     is appropriate. His pain level is dropping, although he does have some     pain, and we are treating pain, not addiction. He also understands I am     an addictionologist. I am going  to try to decrease his medications and     continue to treat his pain, and he understands the risks of these     medications. He is not to drive. He also understands the potential     habituating nature, and risks within the context of appropriate     physiologic function. He accepts.  3. With full disclosure, I am going to decrease his  OxyContin to 40 mg     b.i.d., his Duragesic to 100 micrograms and then to 50 micrograms and I     addressed his Roxicodone as well. His Roxicodone  will be essentially     used as a rescue, and over the next 2 weeks this would bring  him down.     If he needs to go up to q.i.d. Roxicodone that is fine at 15 mg strength,     but I am going to hold him at t.i.d. for breakthrough, preferably it     would be b.i.d., and then decrease to every  day and then go back to     Duragesic eventually at a lower dose as our primary pharmacokinetically     long-acting agent.  4. I am not really clear why he is on Duragesic, OxyContin and Roxicodone     from the beginning, but this is our problem, and rather then a single     agent such as Duragesic in a very high dose, I am just going to keep him     on what he is on and start working  with it. Cost is an issue and     Duragesic will be decreased first. The rationale is Oxycodone has very     few metabolites and if we can get the Duragesic down to a very dull roar     and eliminate, we will probably go right away to eliminate OxyContin. If     we need help we will get an addictionologist's help. I think we will do     fine here. He understands our instructions.   OBJECTIVE:  Objectively his hands look fine. He has adequate vascular  integrity, adequate capillary filling. He has chest wall pain, abdominal  discomfort, all typical from his pulmonary embolism but it is resolving. I  find no new neurological findings, motor, sensory or reflexes.   IMPRESSION:  1. Status post pulmonary embolism.  2. Narcotic habituation.  3. Pain syndrome secondary to vascular insufficiency.  4. Lupus.  5. Inflammatory disease.   PLAN:  As outlined above. Extensive consultation explained also by the  nurse, and will give  him some vouchers for the 50 microgram strength  Duragesic when he gets there. Financial stressors are present as he is not  working.   Discharge instructions given.  Extensive consultation. He will call us at any  time for problems.                                               Celene Kras, MD    HH/MEDQ  D:  02/06/2003  T:  02/07/2003  Job:  [id number]

## 2011-04-10 NOTE — Discharge Summary (Signed)
[place]  Patient:    [name], [name] Visit Number: [id number] MRN: [id number]          Service Type: MED Location: [phone] Attending Physician:  Corwin Levins Dictated by:   Corwin Levins, M.D. [facility] Admit Date:  05/03/2002 Discharge Date: 05/06/2002                             Discharge Summary  DISCHARGE DIAGNOSES: 1. Pneumonia with pleuritic chest pain. 2. History of polymyositis with lupus like features. 3. History of Raynauds syndrome. 4. History of avascular necrosis of bilateral knees secondary to steroids. 5. History of neutropenia with episode in March 2000. 6. History of interstitial lung disease, questionable pulmonary lupus    involvement. 7. Solitary kidney.  PROCEDURES:  None.  CONSULTATIONS:  None.  HISTORY OF PRESENT ILLNESS:  Please see History and Physical dated May 03, 2002.  The patient is a healthy-appearing, 40 year old, white male with underlying rheumatologic disease as above who presented with fever, left pleuritic chest pain which was quite severe and abnormal chest x-ray with new, left greater than right basilar, subsegmental atelectasis/infiltrate.  It was felt due to the severity of the pain and his underlying comorbidities, he required admission for oxygen, supportive measures, cultures, pain control and IV Zosyn antibiotic.  HOSPITAL COURSE:  He responded nicely to the antibiotic with decrease in his fever and overall improvement pulmonary wise.  He was initially attempted to be managed with morphine PCA pump which, for some reason, he did not respond as well as he did when we changed him over to the Dilaudid p.o.  He continued his Fentanyl patch throughout.  The two days prior to discharge, he was ambulatory, doing well and complaining of only mild discomfort on the Fentanyl patch 75 and p.r.n. Dilaudid.  The day prior to discharge, he was changed from IV Zosyn to Augmentin to which he  remained afebrile.  Throughout the rest of his hospitalization, he had controlled pain, ambulatory and eating well.  As he was doing well, he was to have felt have gained maximum benefit from this hospitalization and is to be discharged home.  DISPOSITION:  Discharged home in good condition.  DISCHARGE MEDICATIONS: 1. Augmentin 500 mg p.o. b.i.d. x8 days. 2. Dilaudid 4 mg q.4h. p.r.n. #60, no refill. 3. Fentanyl patch 75. 4. Prednisone 9 mg. 5. IVIG with parenteral dosing, last treatment April 2003.  ACTIVITY:  No restrictions.  DIET:  No restrictions.  FOLLOWUP:  He is to follow up in two weeks with Dr. Abran Cantor at [facility]. Dictated by:   Corwin Levins, M.D. [facility] Attending Physician:  Corwin Levins DD:  05/06/02 TD:  05/08/02 Job: [id number]

## 2011-04-10 NOTE — Consult Note (Signed)
NAME:  [name], [name]                    ACCOUNT NO.:  [id number]   MEDICAL RECORD NO.:  [id number]                   PATIENT TYPE:  REC   LOCATION:  TPC                                  FACILITY:  [facility]   PHYSICIAN:  Sondra Come, D.O.                 DATE OF BIRTH:  05/15/1971   DATE OF CONSULTATION:  07/12/2002  DATE OF DISCHARGE:                  PHYSICAL MEDICINE & REHABILITATION CONSULTATION   The patient comes to clinic today primarily for medication refills.  He was  seen by Dr. Stevphen Rochester on June 27, 2002 for evaluation for stellate ganglion  blocks.  Dr. Stevphen Rochester will see the patient in follow-up on August 08, 2002  for a stellate ganglion block for Raynaud's phenomenon.  He has also  referred him to Dr. Daleen Bo in [place] to evaluate for possible  stimulator for his Raynaud's.  In the interim we have obtained multiple  records from [facility] in regards to previous pain  medications and history with medications.  Dr. Stevphen Rochester has accepted the  responsibility to write for the patient's opiate based pain medications on  the basis of avascular necrosis and Raynaud's phenomenon.  In his absence I  will bridge the patient's pain medications until follow-up with Dr. Stevphen Rochester.  The patient understands our controlled substance policy.  He states that he  has had a prescription from Dr. Clent Ridges for oxycodone 5 mg and has a few pills  remaining but he did not bring his pill bottles with him today.  We  discussed medication usage and alternative therapies.  He still has not  gotten into aquatic therapy and I discussed this with him.  He states he is  very busy and works long hours and has difficulty setting aside the time for  therapy but I think this would help him in the long run.  The patient's  vital signs are stable today.   IMPRESSION:  1. Raynaud's syndrome.  2. Avascular necrosis bilateral knees with chronic pain.   PLAN:  1. Discussed controlled  substance agreement again with the patient.  2. Will prescribe prescription for Duragesic 75 mcg one q.72h. number 10     without refills.  3. Roxicodone 15 mg one-half to one p.o. b.i.d. as needed for breakthrough     pain number 60 without refills.  4. Await aquatic therapy.  5. Await evaluation by Dr. Tyrone Sage.  6. The patient is to return to clinic to see Dr. Stevphen Rochester as scheduled.   The patient was educated on the above findings and recommendations and  understands.  No barriers to communication.                                               Sondra Come, D.O.    JJW/MEDQ  D:  07/12/2002  T:  07/12/2002  Job:  [id number]   cc:   Tera Mater. Clent Ridges, M.D. [facility]

## 2011-04-10 NOTE — Telephone Encounter (Signed)
Okay, but I will write for only one month at a time now. He needs to keep up with his meds

## 2011-04-10 NOTE — H&P (Signed)
NAME:  [name], [name]          ACCOUNT NO.:  [id number]   MEDICAL RECORD NO.:  [id number]          PATIENT TYPE:  INP   LOCATION:  1830                         FACILITY:  [facility]   PHYSICIAN:  Sean A. Everardo All, M.D. [facility] OF BIRTH:  Oct 14, 1971   DATE OF ADMISSION:  03/10/2006  DATE OF DISCHARGE:                                HISTORY & PHYSICAL   REASON FOR ADMISSION:  Fever.   HISTORY OF PRESENT ILLNESS:  The patient is a 40 year old man with a history  of SLE.  He has 1 day of severe fever with associated productive cough and a  headache  diffusely throughout the head.   PAST MEDICAL HISTORY:  1.  SLE.  2.  Pulmonary emboli in 2005.  3.  Osteonecrosis due to chronic prednisone with a result of chronic pain      syndrome.  4.  Anxiety.  5.  Hypertension.  6.  Chronic leukopenia due to his SLE and medications.   MEDICATIONS:  1.  CellCept 2000 mg daily.  2.  Norvasc 10 mg daily.  3.  Plaquenil 200 mg daily.  4.  Prednisone 10 mg daily.  5.  Xanax 1 mg four times a day as needed for anxiety.  6.  AcipHex 20 mg daily.  7.  Oxycodone 5 mg tablets, 2 tablets three times a day as needed for pain.  8.  MiraLax 17 gm daily.   SOCIAL HISTORY:  He is disabled, married and has 1 child.   FAMILY HISTORY:  No one else at home is ill.   REVIEW OF SYSTEMS:  Denies he following:  Nausea, vomiting, earache,  diarrhea, loss of consciousness, visual loss, skin rash, dysuria, abdominal  pain, hematuria, rectal bleeding and hematuria.  He states he has gained  some weight over the past few months.   PHYSICAL EXAMINATION:  VITAL SIGNS:  Blood pressure is 107/77, heart rate  115, respiratory rate 16, temperature of 104.4.  GENERAL:  Slightly ill-appearing man in no distress.  SKIN:  Not diaphoretic.  I do not see a rash.  He describes a rash on his  forehead, but I do not appreciate this.  HEENT:  Head is atraumatic.  No periorbital swelling.  No proptosis.  Pharynx with no  erythema.  NECK:  Supple.  CHEST:  Clear to auscultation.  I do not hear any extra sounds in the chest.  CARDIOVASCULAR:  No JVD, no edema.  Tachycardic.  Regular rhythm.  No  murmur.  Pedal pulses are intact.  ABDOMEN:  Soft, nontender.  No hepatosplenomegaly, no mass.  Slightly obese.  GENITAL/RECTAL:  Not done at this time due to patient condition.  EXTREMITIES:  Despite his history, I do not see any deformities.  NEUROLOGIC:  Alert and oriented.  Cranial nerves appear to be intact, and he  readily moves all fours.   LABORATORY STUDIES:  Chest x-ray shows scarring in both lower lung fields,  which appears to be unchanged since 2005.  He does have some slight patchy  infiltrates elsewhere, which appear to be new, but these do not appear to be  particularly impressive.  WBC  11,000.   IMPRESSION:  1.  Pneumonia probably accounts for his fever.  2.  His systemic lupus erythematosus (SLE) could be another cause of his      fever and infiltrates, but this is less likely.  3.  He claims slight wheezing, which probably is due to his pulmonary      infection.  4.  His hypertension is slightly over controlled.  5.  Chronic pain syndrome due to his osteonecrosis.  6.  Chronic anxiety.  7.  Given his history of neutropenia, this WBC is elevated for him.  8.  Tachycardia, probably due to his fever.   PLAN:  1.  Blood cultures.  2.  Antibiotics.  3.  Bronchodilators p.r.n.  4.  Symptomatic therapy.  5.  This is a difficult management situation.  I believe it is the best,      given the available information, to prescribe him Solu-Medrol, on the      chance that this could be accounted for by a flare of his SLE.  6.  Will consult pulmonary.  7.  Decrease Norvasc.  8.  I discussed the code status with the patient, and he requests full code.  9.  Intravenous fluid, because I assume he has hypovolemia.  10. Check TSH.  Despite the fact that his tachycardia is probably due to his      fever  and he may be hypovolemic, he is at risk for other autoimmune      diseases.           ______________________________  Cleophas Dunker Everardo All, M.D. [facility]     SAE/MEDQ  D:  03/10/2006  T:  03/10/2006  Job:  [id number]   cc:   Jeannett Senior A. Clent Ridges, M.D. [facility]  [address] [place]  [place] [address]

## 2011-04-10 NOTE — Discharge Summary (Signed)
[name], [name]          ACCOUNT NO.:  [id number]   MEDICAL RECORD NO.:  [id number]          PATIENT TYPE:  INP   LOCATION:  5030                         FACILITY:  [facility]   PHYSICIAN:  Rene Paci, M.D. LHCDATE OF BIRTH:  1971/03/14   DATE OF ADMISSION:  03/10/2006  DATE OF DISCHARGE:  03/19/2006                                 DISCHARGE SUMMARY   DISCHARGE DIAGNOSES:  1.  Fever with right upper lobe and lingular infiltrates.  2.  Systemic lupus erythematosus.  3.  Chronic pain syndrome secondary to osteonecrosis.   HISTORY OF PRESENT ILLNESS:  The patient is a 40 year old male admitted, on  March 10, 2006, with a chief complaint of fever.  He has a history of lupus  and was treated with CellCept, Plaquenil, and prednisone.  The patient  presented with a 1-day history of severe fever which was associated  productive cough and headache.  The patient was admitted for further  treatment and evaluation.   PAST MEDICAL HISTORY:  1.  SLE.  2.  Pulmonary emboli, 2005.  3.  Osteonecrosis due to chronic prednisone with chronic pain syndrome.  4.  Anxiety.  5.  Hypertension.  6.  Chronic leukopenia due to SLE and medication.   COURSE OF HOSPITALIZATION:  Fever with right upper lobe and lingular  infiltrates.  The patient was admitted.  Blood cultures were sent.  He was  initially started on IV Solu-Medrol as well as empiric antibiotics which  included IV Rocephin and IV azithromycin.  The patient was seen by Dr.  Jayme Cloud of pulmonary and a CT chest was performed.  CT chest showed no  evidence of PE but did note patchy airspace disease predominately in the  upper lobes, compatible with infection, likely atypical.  In addition,  bibasilar bronchiectasis and scarring was noted.  The patient was then seen  by Dr. Roxan [name] of infectious disease and later by Dr. Johny Sax of  infectious disease.  The patient underwent a bronchoscopy, performed on  March 18, 2006, by the  Dr. Delton Coombes.  At the time of this dictation, bronchial  culture remains no growth to date, as well as bronchial washings for PCP are  negative.  Fungal culture remains no growth to date.  Bronchial wash for AFB  is pending, however, smear is negative for AFB.  Blood cultures drawn, on  March 10, 2006, remain no growth to date.  These cultures will need to be  followed up as an outpatient.  Dr. Delton Coombes recommended that smears and  cultures be followed and that the patient be treated, should they be  positive.  If they are negative, he recommends repeating a CT in one month  as outpatient.  If the infiltrates persist, then he believes the patient  will need a VATS procedure and a right upper lobe biopsy.  At this time,  plan to continue prednisone taper as well as Avelox for a total of 7 days.   MEDICATIONS AT DISCHARGE:  1.  Avelox 400 mg p.o. daily through Mar 24, 2006.  2.  Prednisone 30 mg on March 20, 2006, April 29, 20007, and  March 22, 2006,      then decrease to 20 mg on Mar 23, 2006, Mar 24, 2006, and Mar 25, 2006,      then decrease to 10 mg once daily as before.  3.  MiraLax 17 grams daily in water.  4.  Mycelex 10 mg troche five times daily as before.  5.  Colace 100 mg p.o. daily.  6.  Lunesta 3 mg at bedtime.  7.  Oxycodone 5 mg, 1-2 tablets every six hours.  8.  Cyclobenzaprine 5 mg three times daily as needed.  9.  Advair 250/50, one puff twice daily.  10. CellCept 2000 mg once daily.  11. Omeprazole 20 mg p.o. twice daily.  12. Plaquenil 200 mg p.o. daily.  13. Alprazolam 1 mg p.o. four times daily.  14. Lactate enzymes as needed with dairy.  15. Norvasc 5 mg p.o. daily.   PERTINENT LABORATORY DATA:  At discharge, white blood cell count 14.3  hemoglobin 13, hematocrit 38.4, platelets 201.  Sodium 138, potassium 3.7,  BUN 10, creatinine 1.3.   DISPOSITION:  Plan to transfer the patient to home.   FOLLOW UP:  The patient is instructed to follow up with Dr. Clent Ridges in 1-2   weeks.  He is also instructed to call Dr. Clent Ridges should he develop fever over  101, increased weakness, or cough.      Melissa S. Peggyann Juba, NP      Rene Paci, M.D. [facility]  Electronically Signed    MSO/MEDQ  D:  03/19/2006  T:  03/20/2006  Job:  [id number]   cc:   Jeannett Senior A. Clent Ridges, M.D. [facility]  [address] [place]  [place] [address]

## 2011-05-07 ENCOUNTER — Telehealth: Payer: Self-pay | Admitting: Family Medicine

## 2011-05-07 MED ORDER — AMPHETAMINE-DEXTROAMPHETAMINE 30 MG PO TABS: 30.0000 mg | ORAL_TABLET | Freq: Two times a day (BID) | ORAL | Status: DC

## 2011-05-07 NOTE — Telephone Encounter (Signed)
Pt aware that rx will be ready in am. 

## 2011-05-07 NOTE — Telephone Encounter (Signed)
-   Refill Adderall

## 2011-06-08 ENCOUNTER — Telehealth: Payer: Self-pay | Admitting: Family Medicine

## 2011-06-08 NOTE — Telephone Encounter (Signed)
Patient  Needs a refill prescription on amphetamine-dextroamphetamine (ADDERALL) 30 MG tablet patient is down to one pill.  Patient had a prescription for amphetamine-dextroamphetamine (ADDERALL) 30 MG tablet in the past that he has misplaced and he spoke to Doctor Clent Ridges and he rewrote the prescription for him. He recently found prescription and was inquiring if he could use it to refill his perescription.

## 2011-06-08 NOTE — Telephone Encounter (Signed)
Pt last here on 03/23/11 and script last filled on 05/07/11. Please see note below.

## 2011-06-10 MED ORDER — AMPHETAMINE-DEXTROAMPHETAMINE 30 MG PO TABS: 30.0000 mg | ORAL_TABLET | Freq: Two times a day (BID) | ORAL | Status: DC

## 2011-06-10 NOTE — Telephone Encounter (Signed)
Refills done for 3 months. Tell him he can usually fill old prescriptions up to 6 months after they are written

## 2011-06-10 NOTE — Telephone Encounter (Signed)
Left voice message, scripts are ready for pick up. 

## 2011-06-22 ENCOUNTER — Ambulatory Visit (INDEPENDENT_AMBULATORY_CARE_PROVIDER_SITE_OTHER): Payer: Medicare Other | Admitting: Family Medicine

## 2011-06-22 ENCOUNTER — Encounter: Payer: Self-pay | Admitting: Family Medicine

## 2011-06-22 VITALS — BP 126/78 | HR 117 | Temp 99.2°F | Wt 178.0 lb

## 2011-06-22 DIAGNOSIS — J40 Bronchitis, not specified as acute or chronic: Secondary | ICD-10-CM

## 2011-06-22 MED ORDER — TADALAFIL 20 MG PO TABS: 20.0000 mg | ORAL_TABLET | Freq: Every day | ORAL | Status: DC | PRN

## 2011-06-22 MED ORDER — CEFTRIAXONE SODIUM 1 G IJ SOLR
1.0000 g | INTRAMUSCULAR | Status: DC
  Administered 2011-06-22 – 2011-07-29 (×2): 1 g via INTRAMUSCULAR

## 2011-06-22 MED ORDER — DOXYCYCLINE HYCLATE 100 MG PO CAPS: 100.0000 mg | ORAL_CAPSULE | Freq: Two times a day (BID) | ORAL | Status: AC

## 2011-06-22 NOTE — Progress Notes (Signed)
  Subjective:    Patient ID: [name], male    DOB: 1971/02/17, 40 y.o.   MRN: [id number]  HPI Here for 4 days of chest congestion and a dry cough. No fever.    Review of Systems  Constitutional: Negative.   HENT: Negative.   Eyes: Negative.   Respiratory: Positive for cough and shortness of breath. Negative for wheezing.   Cardiovascular: Negative.        Objective:   Physical Exam  Constitutional: He appears well-developed and well-nourished.  HENT:  Right Ear: External ear normal.  Left Ear: External ear normal.  Nose: Nose normal.  Mouth/Throat: Oropharynx is clear and moist. No oropharyngeal exudate.  Eyes: Conjunctivae are normal.  Neck: No thyromegaly present.  Pulmonary/Chest: Effort normal. No respiratory distress. He has no wheezes. He exhibits no tenderness.       Has his baseline crackles only   Lymphadenopathy:    He has no cervical adenopathy.          Assessment & Plan:   Off work today.

## 2011-07-09 ENCOUNTER — Other Ambulatory Visit: Payer: Self-pay | Admitting: Family Medicine

## 2011-07-10 ENCOUNTER — Telehealth: Payer: Self-pay | Admitting: Family Medicine

## 2011-07-10 NOTE — Telephone Encounter (Signed)
Refill request for Zolpidem 10 mg take 1 po qhs. Pt last here on 06/22/11 and script last filled on 05/28/11.

## 2011-07-10 NOTE — Telephone Encounter (Signed)
LOV 06/22/11 NOV-none

## 2011-07-14 MED ORDER — ZOLPIDEM TARTRATE 10 MG PO TABS: 10.0000 mg | ORAL_TABLET | Freq: Every evening | ORAL | Status: DC | PRN

## 2011-07-14 NOTE — Telephone Encounter (Signed)
Script called in

## 2011-07-14 NOTE — Telephone Encounter (Signed)
Call in #30 with 5 rf 

## 2011-07-28 ENCOUNTER — Telehealth: Payer: Self-pay | Admitting: Family Medicine

## 2011-07-28 MED ORDER — MOXIFLOXACIN HCL 400 MG PO TABS: 400.0000 mg | ORAL_TABLET | Freq: Every day | ORAL | Status: DC

## 2011-07-28 NOTE — Telephone Encounter (Signed)
rx sent

## 2011-07-28 NOTE — Telephone Encounter (Signed)
Pt has bronchitus and is req antibiotic, Avelox,  to be called in to [facility] on [address] [facility]. Pt has lupus and is very concerned about this turning in to pneumonia.

## 2011-07-28 NOTE — Telephone Encounter (Signed)
Call in Avelox 400 mg q day for 10 days  

## 2011-07-29 ENCOUNTER — Encounter: Payer: Self-pay | Admitting: Family Medicine

## 2011-07-29 ENCOUNTER — Ambulatory Visit (INDEPENDENT_AMBULATORY_CARE_PROVIDER_SITE_OTHER): Payer: Medicare Other | Admitting: Family Medicine

## 2011-07-29 VITALS — BP 120/82 | HR 77 | Temp 98.7°F | Wt 174.0 lb

## 2011-07-29 DIAGNOSIS — J40 Bronchitis, not specified as acute or chronic: Secondary | ICD-10-CM

## 2011-07-29 MED ORDER — MOXIFLOXACIN HCL 400 MG PO TABS: 400.0000 mg | ORAL_TABLET | Freq: Every day | ORAL | Status: AC

## 2011-07-29 NOTE — Progress Notes (Signed)
  Subjective:    Patient ID: [name], male    DOB: 01-Nov-1971, 40 y.o.   MRN: [id number]  HPI Here for 4 days of chest congestion and coughing up yellow sputum. No fever.    Review of Systems  Constitutional: Negative.   HENT: Negative.   Eyes: Negative.   Respiratory: Positive for cough, chest tightness and shortness of breath.        Objective:   Physical Exam  Constitutional: He appears well-developed and well-nourished.  HENT:  Right Ear: External ear normal.  Left Ear: External ear normal.  Nose: Nose normal.  Mouth/Throat: Oropharynx is clear and moist. No oropharyngeal exudate.  Eyes: Conjunctivae are normal. Pupils are equal, round, and reactive to light.  Neck: No thyromegaly present.  Pulmonary/Chest: He has no rales.       Scattered wheezes and rhonchi   Lymphadenopathy:    He has no cervical adenopathy.          Assessment & Plan:  Recheck prn

## 2011-08-05 ENCOUNTER — Telehealth: Payer: Self-pay | Admitting: Family Medicine

## 2011-08-05 NOTE — Telephone Encounter (Signed)
Refill generic Adderall. Thanks.

## 2011-08-06 NOTE — Telephone Encounter (Signed)
He still has refills at home that he can use. He is aware

## 2011-08-11 ENCOUNTER — Other Ambulatory Visit: Payer: Self-pay | Admitting: Family Medicine

## 2011-08-12 NOTE — Telephone Encounter (Signed)
Pt last seen 08/05/11. Pls advise.

## 2011-08-13 ENCOUNTER — Telehealth: Payer: Self-pay | Admitting: Family Medicine

## 2011-08-13 NOTE — Telephone Encounter (Signed)
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 Pt is suppose to serve on 9/25 and needs letter before then. Pls notify when letter is ready for pick up or this letter can be faxed to [facility]. Pt will call back with Fax # info.

## 2011-08-14 ENCOUNTER — Telehealth: Payer: Self-pay | Admitting: Family Medicine

## 2011-08-14 LAB — URINALYSIS, ROUTINE W REFLEX MICROSCOPIC
Bilirubin Urine: NEGATIVE
Glucose, UA: NEGATIVE
Hgb urine dipstick: NEGATIVE
Ketones, ur: NEGATIVE
Nitrite: NEGATIVE
Protein, ur: NEGATIVE
Specific Gravity, Urine: 1.019
Urobilinogen, UA: 0.2
pH: 7

## 2011-08-14 LAB — CBC
HCT: 45.7
Hemoglobin: 15.2
MCHC: 33.3
MCV: 91.9
Platelets: 237
RBC: 4.97
RDW: 16.7 — ABNORMAL HIGH
WBC: 11.9 — ABNORMAL HIGH

## 2011-08-14 LAB — BASIC METABOLIC PANEL
BUN: 16
CO2: 33 — ABNORMAL HIGH
Calcium: 9.2
Chloride: 103
Creatinine, Ser: 1.11
GFR calc Af Amer: >60
GFR calc non Af Amer: >60
Glucose, Bld: 88
Potassium: 4.7
Sodium: 140

## 2011-08-14 MED ORDER — ALPRAZOLAM 1 MG PO TABS: 1.0000 mg | ORAL_TABLET | ORAL | Status: DC | PRN

## 2011-08-14 NOTE — Telephone Encounter (Signed)
Done, note is in your box

## 2011-08-14 NOTE — Telephone Encounter (Signed)
Refill Xanax to [facility] [address]. Pt stated that the pharmacy sent the fax on Monday. Please call pt when ready. He is completely out of meds. Thanks.

## 2011-08-14 NOTE — Telephone Encounter (Signed)
Call in Xanax #90 with 5 rf 

## 2011-08-14 NOTE — Telephone Encounter (Signed)
I called in script 

## 2011-08-14 NOTE — Telephone Encounter (Signed)
Open in error

## 2011-08-14 NOTE — Telephone Encounter (Signed)
Left voice message, note is ready for pick up.

## 2011-08-24 ENCOUNTER — Telehealth: Payer: Self-pay | Admitting: Family Medicine

## 2011-08-24 ENCOUNTER — Encounter: Payer: Self-pay | Admitting: Family Medicine

## 2011-08-24 ENCOUNTER — Ambulatory Visit (INDEPENDENT_AMBULATORY_CARE_PROVIDER_SITE_OTHER): Payer: Medicare Other | Admitting: Family Medicine

## 2011-08-24 VITALS — BP 122/78 | HR 83 | Temp 98.9°F | Wt 173.0 lb

## 2011-08-24 DIAGNOSIS — J40 Bronchitis, not specified as acute or chronic: Secondary | ICD-10-CM

## 2011-08-24 MED ORDER — MOXIFLOXACIN HCL 400 MG PO TABS: 400.0000 mg | ORAL_TABLET | Freq: Every day | ORAL | Status: AC

## 2011-08-24 MED ORDER — CEFTRIAXONE SODIUM 1 G IJ SOLR
1.0000 g | INTRAMUSCULAR | Status: DC
  Administered 2011-08-24 – 2011-10-05 (×2): 1 g via INTRAMUSCULAR

## 2011-08-24 NOTE — Progress Notes (Signed)
Addended by: Aniceto Boss A on: 08/24/2011 04:25 PM   Modules accepted: Orders

## 2011-08-24 NOTE — Progress Notes (Signed)
  Subjective:    Patient ID: [name], male    DOB: [birth year], 40 y.o.   MRN: [id number]  HPI Here for a recurrence of bronchitis. He was seen on 07-29-11 for this, and he was given a shot of Rocephin and a 10 day course of Avelox. He says he got almost back to normal but now for 3 days the chest tightness and cough have returned. No fever.    Review of Systems  Constitutional: Negative.   HENT: Negative.   Eyes: Negative.   Respiratory: Positive for cough and chest tightness.        Objective:   Physical Exam  Constitutional: He appears well-developed and well-nourished.  HENT:  Right Ear: External ear normal.  Left Ear: External ear normal.  Nose: Nose normal.  Mouth/Throat: Oropharynx is clear and moist. No oropharyngeal exudate.  Eyes: Conjunctivae are normal. Pupils are equal, round, and reactive to light.  Neck: No thyromegaly present.  Pulmonary/Chest: Effort normal. He has no rales.       Scattered rhonchi and wheezes  Lymphadenopathy:    He has no cervical adenopathy.          Assessment & Plan:  Partially treated bronchitis. Given another round of Rocephin and oral Avelox.

## 2011-08-24 NOTE — Telephone Encounter (Addendum)
Pt was seen for bronchitis on 07-29-2011 somewhat better now . Pt would like to come in for rocephine inj. Pt walk in ov sch for 3pm today.

## 2011-09-03 DIAGNOSIS — I2699 Other pulmonary embolism without acute cor pulmonale: Secondary | ICD-10-CM

## 2011-09-03 HISTORY — DX: Other pulmonary embolism without acute cor pulmonale: I26.99

## 2011-09-09 ENCOUNTER — Telehealth: Payer: Self-pay | Admitting: *Deleted

## 2011-09-09 NOTE — Telephone Encounter (Signed)
Pt is calling to see Dr. Clent Ridges for a recurrent sinusitis.  Scheduled tomorrow, but was under the impression, he had refills on Avelox.  Would like to have it called to pharmacy to start tonight.

## 2011-09-10 ENCOUNTER — Ambulatory Visit (INDEPENDENT_AMBULATORY_CARE_PROVIDER_SITE_OTHER): Payer: Medicare Other | Admitting: Family Medicine

## 2011-09-10 ENCOUNTER — Encounter: Payer: Self-pay | Admitting: Family Medicine

## 2011-09-10 VITALS — BP 108/70 | HR 115 | Temp 97.7°F | Wt 171.0 lb

## 2011-09-10 DIAGNOSIS — J329 Chronic sinusitis, unspecified: Secondary | ICD-10-CM

## 2011-09-10 MED ORDER — CEFTRIAXONE SODIUM 1 G IJ SOLR
1.0000 g | INTRAMUSCULAR | Status: AC
  Administered 2011-09-10: 1 g via INTRAMUSCULAR

## 2011-09-10 MED ORDER — LEVOFLOXACIN 500 MG PO TABS: 500.0000 mg | ORAL_TABLET | Freq: Every day | ORAL | Status: AC

## 2011-09-10 NOTE — Progress Notes (Signed)
  Subjective:    Patient ID: DILON LANK, male    DOB: June 08, 1971, 40 y.o.   MRN: [id number]  HPI Here for 3 days of sinus pressure, HA, and fever. No cough.   Review of Systems  Constitutional: Positive for fever.  HENT: Positive for congestion, postnasal drip and sinus pressure.   Eyes: Negative.   Respiratory: Negative.        Objective:   Physical Exam  Constitutional: He appears well-developed and well-nourished.  HENT:  Right Ear: External ear normal.  Left Ear: External ear normal.  Nose: Nose normal.  Mouth/Throat: Oropharynx is clear and moist. No oropharyngeal exudate.  Eyes: Conjunctivae are normal. Pupils are equal, round, and reactive to light.  Neck: Neck supple. No thyromegaly present.  Pulmonary/Chest: Effort normal and breath sounds normal. No respiratory distress. He has no wheezes. He has no rales. He exhibits no tenderness.  Lymphadenopathy:    He has no cervical adenopathy.          Assessment & Plan:  Recheck prn

## 2011-09-10 NOTE — Telephone Encounter (Signed)
Has an OV for today

## 2011-09-10 NOTE — Progress Notes (Signed)
Addended by: Aniceto Boss A on: 09/10/2011 11:53 AM   Modules accepted: Orders

## 2011-09-18 ENCOUNTER — Other Ambulatory Visit: Payer: Self-pay | Admitting: Endocrinology

## 2011-09-18 NOTE — Telephone Encounter (Signed)
i printed Ov is due 

## 2011-09-18 NOTE — Telephone Encounter (Signed)
Rx faxed to [facility]. Appointment scheduled 09/21/2011

## 2011-09-21 ENCOUNTER — Ambulatory Visit: Payer: BC Managed Care – PPO | Admitting: Endocrinology

## 2011-09-21 DIAGNOSIS — Z0289 Encounter for other administrative examinations: Secondary | ICD-10-CM

## 2011-09-23 ENCOUNTER — Telehealth: Payer: Self-pay | Admitting: Family Medicine

## 2011-09-23 NOTE — Telephone Encounter (Signed)
Pt has pink in [place] eye and is wanting eye drops called into [facility]

## 2011-09-24 MED ORDER — NEOMYCIN-POLYMYXIN-HC OP SUSP: 2.0000 [drp] | OPHTHALMIC | Status: DC | PRN

## 2011-09-24 NOTE — Telephone Encounter (Signed)
Script called in and spoke with pt.

## 2011-09-24 NOTE — Telephone Encounter (Signed)
Call in Cortisporin ophthalmic drops , use 2 drops q 4 hours prn, 10 ml with no rf

## 2011-10-05 ENCOUNTER — Ambulatory Visit (INDEPENDENT_AMBULATORY_CARE_PROVIDER_SITE_OTHER): Payer: Medicare Other | Admitting: Family Medicine

## 2011-10-05 ENCOUNTER — Encounter: Payer: Self-pay | Admitting: Family Medicine

## 2011-10-05 VITALS — BP 116/74 | HR 121 | Temp 99.2°F | Wt 176.0 lb

## 2011-10-05 DIAGNOSIS — J40 Bronchitis, not specified as acute or chronic: Secondary | ICD-10-CM

## 2011-10-05 DIAGNOSIS — J329 Chronic sinusitis, unspecified: Secondary | ICD-10-CM

## 2011-10-05 MED ORDER — AMPHETAMINE-DEXTROAMPHETAMINE 30 MG PO TABS: 30.0000 mg | ORAL_TABLET | Freq: Two times a day (BID) | ORAL | Status: DC

## 2011-10-05 MED ORDER — CEFTRIAXONE SODIUM 1 G IJ SOLR: 1.0000 g | Freq: Once | INTRAMUSCULAR | Status: DC

## 2011-10-05 MED ORDER — MOXIFLOXACIN HCL 400 MG PO TABS: 400.0000 mg | ORAL_TABLET | Freq: Every day | ORAL | Status: AC

## 2011-10-05 NOTE — Progress Notes (Signed)
  Subjective:    Patient ID: [name], male    DOB: 03/02/1971, 40 y.o.   MRN: [id number]  HPI Here for recurrent sinus infection symptoms. We saw him 3 weeks ago for this. He got a Rocephin shot and took 2 weks of Levaquin. He felt better for the 2 weeks, but now the symptoms have returned. He has sinus pressure, PND, ST, low grade fevers, and aches.    Review of Systems  Constitutional: Negative.   HENT: Positive for congestion, postnasal drip and sinus pressure.   Eyes: Negative.   Respiratory: Negative.        Objective:   Physical Exam  Constitutional: He appears well-developed and well-nourished.  HENT:  Right Ear: External ear normal.  Left Ear: External ear normal.  Nose: Nose normal.  Mouth/Throat: Oropharynx is clear and moist. No oropharyngeal exudate.  Eyes: Conjunctivae are normal. Pupils are equal, round, and reactive to light.  Neck: No thyromegaly present.  Pulmonary/Chest: Effort normal.       Soft rhonchi  Lymphadenopathy:    He has no cervical adenopathy.          Assessment & Plan:  We will give a 2 week course of Avelox and 3 consecutive days of Rocephin shots

## 2011-10-06 ENCOUNTER — Ambulatory Visit (INDEPENDENT_AMBULATORY_CARE_PROVIDER_SITE_OTHER): Payer: Medicare Other | Admitting: Family Medicine

## 2011-10-06 DIAGNOSIS — J209 Acute bronchitis, unspecified: Secondary | ICD-10-CM

## 2011-10-06 MED ORDER — CEFTRIAXONE SODIUM 1 G IJ SOLR
1.0000 g | Freq: Once | INTRAMUSCULAR | Status: AC
  Administered 2011-10-06: 1 g via INTRAMUSCULAR

## 2011-10-07 ENCOUNTER — Ambulatory Visit (INDEPENDENT_AMBULATORY_CARE_PROVIDER_SITE_OTHER): Payer: Medicare Other | Admitting: Family Medicine

## 2011-10-07 DIAGNOSIS — J40 Bronchitis, not specified as acute or chronic: Secondary | ICD-10-CM

## 2011-10-07 MED ORDER — CEFTRIAXONE SODIUM 1 G IJ SOLR
1.0000 g | INTRAMUSCULAR | Status: AC
  Administered 2011-10-07: 1 g via INTRAMUSCULAR

## 2011-10-09 ENCOUNTER — Ambulatory Visit (INDEPENDENT_AMBULATORY_CARE_PROVIDER_SITE_OTHER): Payer: Medicare Other | Admitting: Family Medicine

## 2011-10-09 DIAGNOSIS — J40 Bronchitis, not specified as acute or chronic: Secondary | ICD-10-CM

## 2011-10-09 MED ORDER — CEFTRIAXONE SODIUM 1 G IJ SOLR
1.0000 g | INTRAMUSCULAR | Status: AC
  Administered 2011-10-09: 1 g via INTRAMUSCULAR

## 2011-10-30 ENCOUNTER — Telehealth: Payer: Self-pay | Admitting: Family Medicine

## 2011-10-30 NOTE — Telephone Encounter (Signed)
Has ? About his abx. He [facility] an appt at [facility].

## 2011-10-31 ENCOUNTER — Ambulatory Visit: Payer: Medicare Other | Admitting: Family Medicine

## 2011-11-03 NOTE — Telephone Encounter (Signed)
Spoke with pt

## 2011-11-27 DIAGNOSIS — B079 Viral wart, unspecified: Secondary | ICD-10-CM | POA: Diagnosis not present

## 2011-11-27 DIAGNOSIS — B351 Tinea unguium: Secondary | ICD-10-CM | POA: Diagnosis not present

## 2011-12-17 DIAGNOSIS — I7300 Raynaud's syndrome without gangrene: Secondary | ICD-10-CM | POA: Diagnosis not present

## 2011-12-17 DIAGNOSIS — M545 Low back pain, unspecified: Secondary | ICD-10-CM | POA: Diagnosis not present

## 2011-12-17 DIAGNOSIS — M479 Spondylosis, unspecified: Secondary | ICD-10-CM | POA: Diagnosis not present

## 2011-12-17 DIAGNOSIS — Z885 Allergy status to narcotic agent status: Secondary | ICD-10-CM | POA: Diagnosis not present

## 2011-12-17 DIAGNOSIS — M47817 Spondylosis without myelopathy or radiculopathy, lumbosacral region: Secondary | ICD-10-CM | POA: Diagnosis not present

## 2011-12-17 DIAGNOSIS — Z882 Allergy status to sulfonamides status: Secondary | ICD-10-CM | POA: Diagnosis not present

## 2011-12-25 ENCOUNTER — Other Ambulatory Visit: Payer: Self-pay | Admitting: Family Medicine

## 2011-12-25 ENCOUNTER — Other Ambulatory Visit: Payer: Self-pay | Admitting: Endocrinology

## 2011-12-28 NOTE — Telephone Encounter (Signed)
[facility]

## 2011-12-28 NOTE — Telephone Encounter (Signed)
Script faxed back to [facility]... 12/28/11@3 :06pm/LMB

## 2011-12-28 NOTE — Telephone Encounter (Signed)
i printed Ov is due 

## 2012-01-13 ENCOUNTER — Ambulatory Visit (INDEPENDENT_AMBULATORY_CARE_PROVIDER_SITE_OTHER)
Admission: RE | Admit: 2012-01-13 | Discharge: 2012-01-13 | Disposition: A | Discharge status: Home / Self Care | Payer: Medicare Other | Source: Ambulatory Visit | Attending: Family Medicine | Admitting: Family Medicine

## 2012-01-13 ENCOUNTER — Ambulatory Visit: Payer: Medicare Other | Admitting: Family Medicine

## 2012-01-13 ENCOUNTER — Ambulatory Visit (INDEPENDENT_AMBULATORY_CARE_PROVIDER_SITE_OTHER): Payer: Medicare Other | Admitting: Family Medicine

## 2012-01-13 ENCOUNTER — Encounter: Payer: Self-pay | Admitting: Family Medicine

## 2012-01-13 VITALS — BP 144/86 | HR 88 | Temp 98.5°F | Wt 180.0 lb

## 2012-01-13 DIAGNOSIS — S6000XA Contusion of unspecified finger without damage to nail, initial encounter: Secondary | ICD-10-CM | POA: Diagnosis not present

## 2012-01-13 DIAGNOSIS — Z23 Encounter for immunization: Secondary | ICD-10-CM | POA: Diagnosis not present

## 2012-01-13 DIAGNOSIS — S6980XA Other specified injuries of unspecified wrist, hand and finger(s), initial encounter: Secondary | ICD-10-CM | POA: Diagnosis not present

## 2012-01-13 DIAGNOSIS — F909 Attention-deficit hyperactivity disorder, unspecified type: Secondary | ICD-10-CM | POA: Diagnosis not present

## 2012-01-13 DIAGNOSIS — S6990XA Unspecified injury of unspecified wrist, hand and finger(s), initial encounter: Secondary | ICD-10-CM | POA: Diagnosis not present

## 2012-01-13 MED ORDER — AMPHETAMINE-DEXTROAMPHETAMINE 30 MG PO TABS: 30.0000 mg | ORAL_TABLET | Freq: Two times a day (BID) | ORAL | Status: DC

## 2012-01-13 NOTE — Progress Notes (Signed)
  Subjective:    Patient ID: [name], male    DOB: February 03, 1971, 41 y.o.   MRN: [id number]  HPI Here to check his right index finger which he injured about 36 hours ago. He was sleeping with his hands over the back of a chair when th chair tipped over, mashing his finger between the chair back and the wall. It has been painful and swollen since then. Using ice. He also needs Adderall refills.    Review of Systems  Constitutional: Negative.   Musculoskeletal: Positive for joint swelling.       Objective:   Physical Exam  Constitutional: He appears well-developed and well-nourished.  Musculoskeletal:       The right index finger is swollen and quite tender over the distal tip and the DIP joint. ROM is reduced. Good alignment. No crepitus           Assessment & Plan:  We applied a splint to the finger. Sent him for Xrays this afternoon.

## 2012-01-13 NOTE — Progress Notes (Signed)
Addended by: Aniceto Boss A on: 01/13/2012 01:20 PM   Modules accepted: Orders

## 2012-01-15 NOTE — Progress Notes (Signed)
Quick Note:  Left voice message ______ 

## 2012-01-20 ENCOUNTER — Other Ambulatory Visit: Payer: Self-pay | Admitting: Family Medicine

## 2012-01-22 NOTE — Telephone Encounter (Signed)
Okay for 6 months of Zolpidem and one year of Flonase

## 2012-01-26 ENCOUNTER — Other Ambulatory Visit: Payer: Self-pay | Admitting: Family Medicine

## 2012-01-28 ENCOUNTER — Telehealth: Payer: Self-pay | Admitting: Family Medicine

## 2012-01-28 NOTE — Telephone Encounter (Signed)
Patient called stating that his xanax was called in for the wrong amount. He stated it should have been 90 tabs. Patient is also wanting an update on his ambien refill. Please assist.

## 2012-01-29 NOTE — Telephone Encounter (Signed)
We already refilled the Ambien on 01-20-12. As for the Xanax, call in #90 with 5 rf

## 2012-02-02 ENCOUNTER — Other Ambulatory Visit: Payer: Self-pay | Admitting: Family Medicine

## 2012-02-02 NOTE — Telephone Encounter (Signed)
Pt needs refill on ambien 10mg  and generic xanax 1mg ,pt stated he takes up to 3 generic xanax a day. Pt was given only 30 pills at last refill. [facility] [phone]

## 2012-02-03 MED ORDER — ALPRAZOLAM 1 MG PO TABS: 1.0000 mg | ORAL_TABLET | ORAL | Status: DC | PRN

## 2012-02-03 NOTE — Telephone Encounter (Signed)
Addended by: Aniceto Boss A on: 02/03/2012 10:33 AM   Modules accepted: Orders

## 2012-02-03 NOTE — Telephone Encounter (Signed)
Script was called in and left voice message.

## 2012-02-03 NOTE — Telephone Encounter (Signed)
This has been done.

## 2012-02-15 ENCOUNTER — Other Ambulatory Visit: Payer: Self-pay | Admitting: Family Medicine

## 2012-02-15 NOTE — Telephone Encounter (Signed)
Pt would generic xanax #90 call into [facility] [phone]

## 2012-02-16 ENCOUNTER — Telehealth: Payer: Self-pay | Admitting: Family Medicine

## 2012-02-16 NOTE — Telephone Encounter (Signed)
I called pharmacy and left message. This script was sent in for # 90.

## 2012-02-16 NOTE — Telephone Encounter (Signed)
NO, we already did this on 02-03-12

## 2012-02-16 NOTE — Telephone Encounter (Signed)
I spoke with pt and he has been taking 1 po tid. Per Dr. Clent Ridges call in for tid.

## 2012-02-16 NOTE — Telephone Encounter (Signed)
Pt called and said that he went to [facility] on [address] and was told that the Alprazolam was called in for # 30, but it should be for # 90. Pt said to please call the pharmacy asap and correct the script t0 #90.

## 2012-02-17 MED ORDER — ALPRAZOLAM 1 MG PO TABS: 1.0000 mg | ORAL_TABLET | Freq: Three times a day (TID) | ORAL | Status: DC | PRN

## 2012-02-17 NOTE — Telephone Encounter (Signed)
Addended by: Beverely Low on: 02/17/2012 09:08 AM   Modules accepted: Orders

## 2012-02-17 NOTE — Telephone Encounter (Signed)
Spoke with pharmacist Nedra Hai at [facility] to change sig of pt's Xanax Rx. Lee closed out Rx for the qd.

## 2012-02-22 DIAGNOSIS — Z79899 Other long term (current) drug therapy: Secondary | ICD-10-CM

## 2012-02-22 DIAGNOSIS — M329 Systemic lupus erythematosus, unspecified: Secondary | ICD-10-CM | POA: Diagnosis not present

## 2012-02-22 DIAGNOSIS — R300 Dysuria: Secondary | ICD-10-CM | POA: Diagnosis not present

## 2012-02-22 DIAGNOSIS — I2699 Other pulmonary embolism without acute cor pulmonale: Secondary | ICD-10-CM | POA: Diagnosis not present

## 2012-02-22 HISTORY — DX: Other long term (current) drug therapy: Z79.899

## 2012-02-29 ENCOUNTER — Ambulatory Visit (INDEPENDENT_AMBULATORY_CARE_PROVIDER_SITE_OTHER): Payer: Medicare Other | Admitting: Endocrinology

## 2012-02-29 ENCOUNTER — Encounter: Payer: Self-pay | Admitting: Endocrinology

## 2012-02-29 ENCOUNTER — Other Ambulatory Visit (INDEPENDENT_AMBULATORY_CARE_PROVIDER_SITE_OTHER): Payer: Medicare Other

## 2012-02-29 VITALS — BP 122/82 | HR 115 | Temp 97.5°F | Ht 67.0 in | Wt 177.6 lb

## 2012-02-29 DIAGNOSIS — E291 Testicular hypofunction: Secondary | ICD-10-CM

## 2012-02-29 DIAGNOSIS — N320 Bladder-neck obstruction: Secondary | ICD-10-CM | POA: Diagnosis not present

## 2012-02-29 LAB — TESTOSTERONE: Testosterone: 962.06 ng/dL — ABNORMAL HIGH (ref 350.00–890.00)

## 2012-02-29 LAB — PSA: PSA: 0.78 ng/mL (ref 0.10–4.00)

## 2012-02-29 MED ORDER — SILDENAFIL CITRATE 100 MG PO TABS: 50.0000 mg | ORAL_TABLET | Freq: Every day | ORAL | Status: DC | PRN
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
## 2012-02-29 NOTE — Patient Instructions (Addendum)
blood tests are being requested for you today.  You will receive a letter with results. Please return in 1 year. 

## 2012-03-01 ENCOUNTER — Telehealth: Payer: Self-pay | Admitting: *Deleted

## 2012-03-01 DIAGNOSIS — N529 Male erectile dysfunction, unspecified: Secondary | ICD-10-CM

## 2012-03-01 HISTORY — DX: Male erectile dysfunction, unspecified: N52.9

## 2012-03-01 NOTE — Telephone Encounter (Signed)
Called pt to inform of lab results, left message for pt to callback office. (Letter also mailed to pt) 

## 2012-03-01 NOTE — Telephone Encounter (Signed)
Pt informed rx sent to pharmacy via vm and to callback office with any questions/concerns.

## 2012-03-01 NOTE — Telephone Encounter (Signed)
done

## 2012-03-01 NOTE — Telephone Encounter (Signed)
Pt informed of lab results, pt would like referral to urologist. Pt also wants to know if you prescribed the medication that was discussed at yesterday's OV.

## 2012-03-08 ENCOUNTER — Other Ambulatory Visit: Payer: Self-pay | Admitting: *Deleted

## 2012-03-08 MED ORDER — TADALAFIL 5 MG PO TABS: 5.0000 mg | ORAL_TABLET | Freq: Every day | ORAL | Status: DC

## 2012-03-08 NOTE — Telephone Encounter (Signed)
Pt would like rx for Cialis 5mg  for 30 days-after trying samples, he feels it would work best for him.

## 2012-03-08 NOTE — Telephone Encounter (Signed)
Left message for pt to callback office.  

## 2012-03-08 NOTE — Telephone Encounter (Signed)
sent 

## 2012-03-09 NOTE — Telephone Encounter (Signed)
Left message for pt to callback office.  

## 2012-03-09 NOTE — Telephone Encounter (Signed)
Pt informed rx sent to pharmacy.

## 2012-03-16 DIAGNOSIS — M545 Low back pain, unspecified: Secondary | ICD-10-CM | POA: Diagnosis not present

## 2012-03-16 DIAGNOSIS — Z462 Encounter for fitting and adjustment of other devices related to nervous system and special senses: Secondary | ICD-10-CM | POA: Diagnosis not present

## 2012-03-16 DIAGNOSIS — G894 Chronic pain syndrome: Secondary | ICD-10-CM | POA: Diagnosis not present

## 2012-03-16 DIAGNOSIS — M25569 Pain in unspecified knee: Secondary | ICD-10-CM | POA: Diagnosis not present

## 2012-03-17 DIAGNOSIS — B351 Tinea unguium: Secondary | ICD-10-CM | POA: Diagnosis not present

## 2012-03-17 DIAGNOSIS — B079 Viral wart, unspecified: Secondary | ICD-10-CM | POA: Diagnosis not present

## 2012-03-30 ENCOUNTER — Encounter: Payer: Self-pay | Admitting: Family Medicine

## 2012-03-30 ENCOUNTER — Ambulatory Visit (INDEPENDENT_AMBULATORY_CARE_PROVIDER_SITE_OTHER): Payer: Medicare Other | Admitting: Family Medicine

## 2012-03-30 VITALS — BP 124/80 | HR 136 | Temp 99.1°F | Wt 178.0 lb

## 2012-03-30 DIAGNOSIS — J40 Bronchitis, not specified as acute or chronic: Secondary | ICD-10-CM

## 2012-03-30 DIAGNOSIS — IMO0002 Reserved for concepts with insufficient information to code with codable children: Secondary | ICD-10-CM

## 2012-03-30 DIAGNOSIS — M329 Systemic lupus erythematosus, unspecified: Secondary | ICD-10-CM

## 2012-03-30 MED ORDER — MOXIFLOXACIN HCL 400 MG PO TABS: 400.0000 mg | ORAL_TABLET | Freq: Every day | ORAL | Status: AC

## 2012-03-30 MED ORDER — CEFTRIAXONE SODIUM 1 G IJ SOLR
1.0000 g | Freq: Once | INTRAMUSCULAR | Status: AC
  Administered 2012-03-30: 1 g via INTRAMUSCULAR

## 2012-03-30 MED ORDER — MYCOPHENOLATE MOFETIL 500 MG PO TABS: 1000.0000 mg | ORAL_TABLET | Freq: Every day | ORAL | Status: DC

## 2012-03-30 NOTE — Progress Notes (Signed)
  Subjective:    Patient ID: [name], male    DOB: [birth year], 41 y.o.   MRN: [id number]  HPI Here for 2 days of chest tightness and coughing up green sputum. No fever. He was started back on Cellcept one month ago, and he often gets sick when he is on this. He sees Marlane Mingle NP for dermatology care. She wants to put him on Lamisil for nail fungus, but asks [place] to get a liver panel first.    Review of Systems  Constitutional: Negative.   HENT: Negative.   Eyes: Negative.   Respiratory: Positive for cough and chest tightness. Negative for wheezing.        Objective:   Physical Exam  Constitutional: He appears well-developed and well-nourished.  HENT:  Right Ear: External ear normal.  Left Ear: External ear normal.  Nose: Nose normal.  Mouth/Throat: Oropharynx is clear and moist.  Eyes: Conjunctivae are normal.  Pulmonary/Chest: Effort normal. No respiratory distress. He has no wheezes. He has no rales.       Has his usual basilar crackles   Lymphadenopathy:    He has no cervical adenopathy.          Assessment & Plan:  Given a Rocephin shot and a course of Avelox. Get a liver panel.

## 2012-03-31 LAB — HEPATIC FUNCTION PANEL
ALT: 18 U/L (ref 0–53)
AST: 24 U/L (ref 0–37)
Albumin: 3.5 g/dL (ref 3.5–5.2)
Alkaline Phosphatase: 55 U/L (ref 39–117)
Bilirubin, Direct: 0.1 mg/dL (ref 0.0–0.3)
Total Bilirubin: 0.6 mg/dL (ref 0.3–1.2)
Total Protein: 6.9 g/dL (ref 6.0–8.3)

## 2012-04-04 ENCOUNTER — Telehealth: Payer: Self-pay | Admitting: Family Medicine

## 2012-04-04 MED ORDER — AMPHETAMINE-DEXTROAMPHETAMINE 30 MG PO TABS: 30.0000 mg | ORAL_TABLET | Freq: Two times a day (BID) | ORAL | Status: DC

## 2012-04-04 NOTE — Telephone Encounter (Signed)
done

## 2012-04-04 NOTE — Telephone Encounter (Signed)
Patient called stating that he need a refill of his adderall. Please assist.  °

## 2012-04-05 NOTE — Progress Notes (Signed)
Quick Note:  I spoke with pt and faxed a copy to [phone]. ______

## 2012-04-25 ENCOUNTER — Encounter: Payer: Self-pay | Admitting: Family Medicine

## 2012-04-25 ENCOUNTER — Telehealth: Payer: Self-pay | Admitting: *Deleted

## 2012-04-25 ENCOUNTER — Ambulatory Visit (INDEPENDENT_AMBULATORY_CARE_PROVIDER_SITE_OTHER): Payer: Medicare Other | Admitting: Family Medicine

## 2012-04-25 VITALS — BP 120/70 | HR 98 | Temp 98.8°F | Wt 180.0 lb

## 2012-04-25 DIAGNOSIS — J329 Chronic sinusitis, unspecified: Secondary | ICD-10-CM | POA: Diagnosis not present

## 2012-04-25 MED ORDER — CEFTRIAXONE SODIUM 1 G IJ SOLR
1.0000 g | Freq: Once | INTRAMUSCULAR | Status: AC
  Administered 2012-04-25: 1 g via INTRAMUSCULAR

## 2012-04-25 MED ORDER — MOXIFLOXACIN HCL 400 MG PO TABS: 400.0000 mg | ORAL_TABLET | Freq: Every day | ORAL | Status: DC

## 2012-04-25 NOTE — Progress Notes (Signed)
Addended by: Aniceto Boss A on: 04/25/2012 01:08 PM   Modules accepted: Orders

## 2012-04-25 NOTE — Progress Notes (Signed)
  Subjective:    Patient ID: IMRI LOR, male    DOB: Aug 28, 1971, 41 y.o.   MRN: [id number]  HPI Here for 5 days of fever, sinus pressure, and HA. Very little coughing.    Review of Systems  Constitutional: Positive for fever.  HENT: Positive for congestion, postnasal drip and sinus pressure.   Eyes: Negative.   Respiratory: Negative.        Objective:   Physical Exam  Constitutional: He appears well-developed and well-nourished.  HENT:  Right Ear: External ear normal.  Left Ear: External ear normal.  Nose: Nose normal.  Mouth/Throat: Oropharynx is clear and moist.  Eyes: Conjunctivae are normal.  Neck: No thyromegaly present.  Pulmonary/Chest: Effort normal. No respiratory distress. He has no wheezes. He has no rales.       Dry basilar crackles   Lymphadenopathy:    He has no cervical adenopathy.          Assessment & Plan:  Sinusitis. Given a Rocephin shot and Avelox.

## 2012-04-25 NOTE — Telephone Encounter (Signed)
PA needed for testosterone-# is [phone] -PA approved on 04/25/2012 for 36 months per [facility]. Pharmacy informed.

## 2012-05-05 ENCOUNTER — Encounter: Payer: Self-pay | Admitting: Family Medicine

## 2012-05-05 ENCOUNTER — Ambulatory Visit (INDEPENDENT_AMBULATORY_CARE_PROVIDER_SITE_OTHER): Payer: Medicare Other | Admitting: Family Medicine

## 2012-05-05 VITALS — BP 120/70 | Temp 99.8°F | Wt 178.0 lb

## 2012-05-05 DIAGNOSIS — R509 Fever, unspecified: Secondary | ICD-10-CM

## 2012-05-05 DIAGNOSIS — B9789 Other viral agents as the cause of diseases classified elsewhere: Secondary | ICD-10-CM

## 2012-05-05 DIAGNOSIS — B349 Viral infection, unspecified: Secondary | ICD-10-CM

## 2012-05-05 LAB — BASIC METABOLIC PANEL
BUN: 16 mg/dL (ref 6–23)
CO2: 30 mEq/L (ref 19–32)
Calcium: 8 mg/dL — ABNORMAL LOW (ref 8.4–10.5)
Chloride: 96 mEq/L (ref 96–112)
Creatinine, Ser: 1.3 mg/dL (ref 0.4–1.5)
GFR: 67.41 mL/min (ref >60.00)
Glucose, Bld: 87 mg/dL (ref 70–99)
Potassium: 4 mEq/L (ref 3.5–5.1)
Sodium: 134 mEq/L — ABNORMAL LOW (ref 135–145)

## 2012-05-05 LAB — CBC WITH DIFFERENTIAL/PLATELET
Basophils Absolute: 0 10*3/uL (ref 0.0–0.1)
Basophils Relative: 0 % (ref 0.0–3.0)
Eosinophils Absolute: 0.2 10*3/uL (ref 0.0–0.7)
Eosinophils Relative: 4.9 % (ref 0.0–5.0)
HCT: 48.5 % (ref 39.0–52.0)
Hemoglobin: 16 g/dL (ref 13.0–17.0)
Lymphocytes Relative: 7.7 % — ABNORMAL LOW (ref 12.0–46.0)
Lymphs Abs: 0.4 10*3/uL — ABNORMAL LOW (ref 0.7–4.0)
MCHC: 32.9 g/dL (ref 30.0–36.0)
MCV: 99.9 fl (ref 78.0–100.0)
Monocytes Absolute: 0.4 10*3/uL (ref 0.1–1.0)
Monocytes Relative: 8.5 % (ref 3.0–12.0)
Neutro Abs: 3.8 10*3/uL (ref 1.4–7.7)
Neutrophils Relative %: 78.9 % — ABNORMAL HIGH (ref 43.0–77.0)
Platelets: 137 10*3/uL — ABNORMAL LOW (ref 150.0–400.0)
RBC: 4.86 Mil/uL (ref 4.22–5.81)
RDW: 13.3 % (ref 11.5–14.6)
WBC: 4.8 10*3/uL (ref 4.5–10.5)

## 2012-05-05 LAB — HEPATIC FUNCTION PANEL
ALT: 60 U/L — ABNORMAL HIGH (ref 0–53)
AST: 41 U/L — ABNORMAL HIGH (ref 0–37)
Albumin: 3.4 g/dL — ABNORMAL LOW (ref 3.5–5.2)
Alkaline Phosphatase: 66 U/L (ref 39–117)
Bilirubin, Direct: 0 mg/dL (ref 0.0–0.3)
Total Bilirubin: 0.6 mg/dL (ref 0.3–1.2)
Total Protein: 7 g/dL (ref 6.0–8.3)

## 2012-05-05 NOTE — Progress Notes (Signed)
  Subjective:    Patient ID: [name], male    DOB: Dec 17, 1970, 41 y.o.   MRN: [id number]  HPI Here for 2 weeks of fevers, ST, and extreme fatigue. We saw him on 04-25-12 for some URI symptoms that seemed like his typical sinus infections. He took a 10 day course of Avelox. However he has not improved at all, and the fever is still present. He has become very fatigued as well, which is unusual for him. Also he has developed an itchy rash over the arms, legs, and trunk. Benadryl helps this. No abdominal pain or NVD. Cough is minimal.   Review of Systems  Constitutional: Positive for fever and fatigue.  HENT: Positive for sore throat.   Eyes: Negative.   Respiratory: Negative.   Gastrointestinal: Negative.   Skin: Positive for rash.       Objective:   Physical Exam  Constitutional: He appears well-developed and well-nourished.  HENT:  Right Ear: External ear normal.  Left Ear: External ear normal.  Mouth/Throat: Oropharynx is clear and moist.  Eyes: Conjunctivae are normal.  Neck: No thyromegaly present.  Cardiovascular: Normal rate, regular rhythm, normal heart sounds and intact distal pulses.  Exam reveals no gallop and no friction rub.   No murmur heard. Pulmonary/Chest: No respiratory distress. He has no wheezes.       He has his typical soft crackles   Abdominal: Soft. Bowel sounds are normal. He exhibits no distension and no mass. There is no tenderness. There is no rebound and no guarding.  Lymphadenopathy:    He has no cervical adenopathy.  Skin:       Widespread urticarial rash as above           Assessment & Plan:  This is a viral syndrome, most likely mononucleosis. Get labs today including some EBV titers. Use Tylenol for fever and benadryl prn. Rest , drink fluids.

## 2012-05-06 ENCOUNTER — Telehealth: Payer: Self-pay | Admitting: Family Medicine

## 2012-05-06 LAB — EPSTEIN-BARR VIRUS VCA, IGG: EBV VCA IgG: >750 U/mL — ABNORMAL HIGH (ref <18.0)

## 2012-05-06 LAB — EPSTEIN-BARR VIRUS VCA, IGM: EBV VCA IgM: <10 U/mL (ref <36.0)

## 2012-05-06 NOTE — Telephone Encounter (Signed)
Per Dr. Clent Ridges it is okay and I did speak with pt.

## 2012-05-06 NOTE — Telephone Encounter (Signed)
Pt would like to be contacted he would like to know if he is fever free can he go back to work on Monday. Please contact

## 2012-05-10 NOTE — Progress Notes (Signed)
Quick Note:  I spoke with pt ______ 

## 2012-07-01 DIAGNOSIS — I7300 Raynaud's syndrome without gangrene: Secondary | ICD-10-CM | POA: Diagnosis not present

## 2012-07-01 DIAGNOSIS — Z882 Allergy status to sulfonamides status: Secondary | ICD-10-CM | POA: Diagnosis not present

## 2012-07-01 DIAGNOSIS — M329 Systemic lupus erythematosus, unspecified: Secondary | ICD-10-CM | POA: Diagnosis not present

## 2012-07-01 DIAGNOSIS — M47817 Spondylosis without myelopathy or radiculopathy, lumbosacral region: Secondary | ICD-10-CM | POA: Diagnosis not present

## 2012-07-01 DIAGNOSIS — J984 Other disorders of lung: Secondary | ICD-10-CM | POA: Diagnosis not present

## 2012-07-01 DIAGNOSIS — Z86711 Personal history of pulmonary embolism: Secondary | ICD-10-CM | POA: Diagnosis not present

## 2012-07-01 DIAGNOSIS — N289 Disorder of kidney and ureter, unspecified: Secondary | ICD-10-CM | POA: Diagnosis not present

## 2012-07-01 DIAGNOSIS — M545 Low back pain, unspecified: Secondary | ICD-10-CM | POA: Diagnosis not present

## 2012-07-01 DIAGNOSIS — Z79899 Other long term (current) drug therapy: Secondary | ICD-10-CM | POA: Diagnosis not present

## 2012-07-01 DIAGNOSIS — Z885 Allergy status to narcotic agent status: Secondary | ICD-10-CM | POA: Diagnosis not present

## 2012-07-01 DIAGNOSIS — Z791 Long term (current) use of non-steroidal anti-inflammatories (NSAID): Secondary | ICD-10-CM | POA: Diagnosis not present

## 2012-07-05 ENCOUNTER — Other Ambulatory Visit: Payer: Self-pay | Admitting: Endocrinology

## 2012-07-06 NOTE — Telephone Encounter (Signed)
MD out of office. Is this ok to refill?Marland Kitchen..07/06/12@8 :21am/LMB

## 2012-07-06 NOTE — Telephone Encounter (Signed)
Faxed script back to [facility]... 07/06/12@9 :21am/LMB

## 2012-07-06 NOTE — Telephone Encounter (Signed)
30d rx authorized, no refills - covering in absence of usual MD

## 2012-07-10 ENCOUNTER — Other Ambulatory Visit: Payer: Self-pay | Admitting: Family Medicine

## 2012-07-13 NOTE — Telephone Encounter (Signed)
Call in #30 with 5 rf 

## 2012-07-20 DIAGNOSIS — M545 Low back pain, unspecified: Secondary | ICD-10-CM | POA: Diagnosis not present

## 2012-07-20 DIAGNOSIS — Z462 Encounter for fitting and adjustment of other devices related to nervous system and special senses: Secondary | ICD-10-CM | POA: Diagnosis not present

## 2012-07-20 DIAGNOSIS — M25569 Pain in unspecified knee: Secondary | ICD-10-CM | POA: Diagnosis not present

## 2012-07-20 DIAGNOSIS — G894 Chronic pain syndrome: Secondary | ICD-10-CM | POA: Diagnosis not present

## 2012-08-08 ENCOUNTER — Ambulatory Visit (INDEPENDENT_AMBULATORY_CARE_PROVIDER_SITE_OTHER): Payer: Medicare Other | Admitting: Family Medicine

## 2012-08-08 ENCOUNTER — Encounter: Payer: Self-pay | Admitting: Family Medicine

## 2012-08-08 VITALS — BP 112/70 | HR 89 | Temp 99.4°F | Wt 182.0 lb

## 2012-08-08 DIAGNOSIS — J329 Chronic sinusitis, unspecified: Secondary | ICD-10-CM | POA: Diagnosis not present

## 2012-08-08 DIAGNOSIS — B9789 Other viral agents as the cause of diseases classified elsewhere: Secondary | ICD-10-CM

## 2012-08-08 DIAGNOSIS — B349 Viral infection, unspecified: Secondary | ICD-10-CM

## 2012-08-08 MED ORDER — AMPHETAMINE-DEXTROAMPHETAMINE 30 MG PO TABS: 30.0000 mg | ORAL_TABLET | Freq: Two times a day (BID) | ORAL | Status: DC

## 2012-08-08 MED ORDER — MOXIFLOXACIN HCL 400 MG PO TABS: 400.0000 mg | ORAL_TABLET | Freq: Every day | ORAL | Status: AC

## 2012-08-08 NOTE — Progress Notes (Signed)
  Subjective:    Patient ID: [name], male    DOB: 09/23/1971, 41 y.o.   MRN: [id number]  HPI Here for 3 days of sinus pressure, PND, ST, and a dry cough. No fever.    Review of Systems  Constitutional: Negative.   HENT: Positive for congestion, sore throat, postnasal drip and sinus pressure.   Eyes: Negative.   Respiratory: Positive for cough.        Objective:   Physical Exam  Constitutional: He appears well-developed and well-nourished.  HENT:  Right Ear: External ear normal.  Left Ear: External ear normal.  Nose: Nose normal.  Mouth/Throat: Oropharynx is clear and moist.  Eyes: Conjunctivae normal are normal.  Neck: No thyromegaly present.  Pulmonary/Chest: Effort normal. No respiratory distress. He has no wheezes.       Scattered rhonchi and dry crackles as usual  Lymphadenopathy:    He has no cervical adenopathy.          Assessment & Plan:  Recheck prn. Add Mucinex

## 2012-08-09 ENCOUNTER — Encounter: Payer: Self-pay | Admitting: Family Medicine

## 2012-08-09 LAB — HEPATIC FUNCTION PANEL
ALT: 14 U/L (ref 0–53)
AST: 21 U/L (ref 0–37)
Albumin: 3.6 g/dL (ref 3.5–5.2)
Alkaline Phosphatase: 55 U/L (ref 39–117)
Bilirubin, Direct: 0.2 mg/dL (ref 0.0–0.3)
Total Bilirubin: 0.7 mg/dL (ref 0.3–1.2)
Total Protein: 7.2 g/dL (ref 6.0–8.3)

## 2012-08-11 NOTE — Progress Notes (Signed)
Quick Note:  Called and spoke with pt and pt is aware. Labs faxed to dermatologist. ______

## 2012-08-22 DIAGNOSIS — Z3189 Encounter for other procreative management: Secondary | ICD-10-CM | POA: Diagnosis not present

## 2012-09-08 ENCOUNTER — Encounter (INDEPENDENT_AMBULATORY_CARE_PROVIDER_SITE_OTHER): Payer: Medicare Other

## 2012-09-08 ENCOUNTER — Ambulatory Visit (INDEPENDENT_AMBULATORY_CARE_PROVIDER_SITE_OTHER): Payer: Medicare Other | Admitting: Internal Medicine

## 2012-09-08 ENCOUNTER — Encounter: Payer: Self-pay | Admitting: Internal Medicine

## 2012-09-08 VITALS — BP 100/68 | Temp 97.9°F | Wt 180.0 lb

## 2012-09-08 DIAGNOSIS — M79604 Pain in right leg: Secondary | ICD-10-CM

## 2012-09-08 DIAGNOSIS — M79609 Pain in unspecified limb: Secondary | ICD-10-CM

## 2012-09-08 DIAGNOSIS — R229 Localized swelling, mass and lump, unspecified: Secondary | ICD-10-CM | POA: Diagnosis not present

## 2012-09-08 DIAGNOSIS — I8000 Phlebitis and thrombophlebitis of superficial vessels of unspecified lower extremity: Secondary | ICD-10-CM

## 2012-09-08 DIAGNOSIS — Z86718 Personal history of other venous thrombosis and embolism: Secondary | ICD-10-CM
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
## 2012-09-08 NOTE — Patient Instructions (Signed)
Venous Doppler evaluation as discussed  Call or return to clinic prn if these symptoms worsen or fail to improve as anticipated.  

## 2012-09-12 ENCOUNTER — Telehealth: Payer: Self-pay | Admitting: Family Medicine

## 2012-09-12 MED ORDER — ENOXAPARIN SODIUM 100 MG/ML ~~LOC~~ SOLN: 90.0000 mg | Freq: Two times a day (BID) | SUBCUTANEOUS | Status: DC

## 2012-09-12 NOTE — Telephone Encounter (Signed)
I sent script e-scribe and spoke with pt. 

## 2012-09-12 NOTE — Telephone Encounter (Signed)
He was found to have a superficial thrombosis in the right leg. He should be anticoagulated for 10 days. Call in Lovenox to give 90 mg bid for 10 days.

## 2012-09-12 NOTE — Progress Notes (Signed)
Quick Note:  I spoke with pt and called in script ______ 

## 2012-09-28 ENCOUNTER — Other Ambulatory Visit: Payer: Self-pay | Admitting: Family Medicine

## 2012-09-28 NOTE — Telephone Encounter (Signed)
NOT yet, he wont be due until 11-07-12

## 2012-09-28 NOTE — Telephone Encounter (Signed)
Pt needs refill/script for ADD med. Thank you.

## 2012-09-29 ENCOUNTER — Telehealth: Payer: Self-pay | Admitting: Family Medicine

## 2012-09-29 NOTE — Telephone Encounter (Signed)
I left voice message with below information. 

## 2012-09-29 NOTE — Telephone Encounter (Signed)
Pt called and said that he found both of his Adderall scripts. Just wanted to make pcp aware.

## 2012-09-29 NOTE — Telephone Encounter (Signed)
noted 

## 2012-10-04 ENCOUNTER — Ambulatory Visit (INDEPENDENT_AMBULATORY_CARE_PROVIDER_SITE_OTHER): Payer: Medicare Other | Admitting: Family Medicine

## 2012-10-04 ENCOUNTER — Encounter: Payer: Self-pay | Admitting: Family Medicine

## 2012-10-04 VITALS — BP 120/84 | HR 113 | Temp 98.5°F | Wt 186.0 lb

## 2012-10-04 DIAGNOSIS — Z23 Encounter for immunization: Secondary | ICD-10-CM

## 2012-10-04 DIAGNOSIS — I82819 Embolism and thrombosis of superficial veins of unspecified lower extremities: Secondary | ICD-10-CM | POA: Diagnosis not present

## 2012-10-04 NOTE — Progress Notes (Signed)
  Subjective:    Patient ID: [name], male    DOB: [birth year], 41 y.o.   MRN: [id number]  HPI Here to follow up on a superficial vein thrombosis in the right greater saphenous vein which was found by venous doppler on 09-08-12. He had been having some groin and thigh pain prior to this visit. No DVT was seen. He was given 10 days of Lovenox. The leg feels better now but he can still feel some clot in the thigh. No chest pain or SOB.    Review of Systems  Constitutional: Negative.   Respiratory: Negative.   Cardiovascular: Negative.        Objective:   Physical Exam  Constitutional: He appears well-developed and well-nourished.  Cardiovascular:       There is a palpable cord along the right medial middle thigh, no edema or tenderness          Assessment & Plan:  Superficial thrombus. With his history of various vascular issues, I think referral to a Vascular Surgeon to further evaluate this is warranted. They can give [place] direction about possible anticoagulation therapies.

## 2012-10-04 NOTE — Addendum Note (Signed)
Addended by: Aniceto Boss A on: 10/04/2012 12:11 PM   Modules accepted: Orders

## 2012-10-06 ENCOUNTER — Other Ambulatory Visit: Payer: Self-pay | Admitting: *Deleted

## 2012-10-06 ENCOUNTER — Encounter: Payer: Self-pay | Admitting: Vascular Surgery

## 2012-10-06 DIAGNOSIS — I8000 Phlebitis and thrombophlebitis of superficial vessels of unspecified lower extremity: Secondary | ICD-10-CM

## 2012-10-07 ENCOUNTER — Encounter: Payer: Medicare Other | Admitting: Vascular Surgery

## 2012-10-21 ENCOUNTER — Other Ambulatory Visit: Payer: Self-pay | Admitting: Internal Medicine

## 2012-10-21 ENCOUNTER — Other Ambulatory Visit: Payer: Self-pay | Admitting: Family Medicine

## 2012-10-24 NOTE — Telephone Encounter (Signed)
Ash, I can't see where Dr. Felicity Coyer has refilled his medications. He sees Dr. Clent Ridges for all his medical care. He needs to contact his office. Thx! Rene Kocher

## 2012-10-24 NOTE — Telephone Encounter (Signed)
Last written 07/05/2012 10mL with 0 refills-please advise in VAL's absence. Thank you.

## 2012-10-25 NOTE — Telephone Encounter (Signed)
Call in #90 with 5 rf 

## 2012-11-10 ENCOUNTER — Encounter: Payer: Self-pay | Admitting: Vascular Surgery

## 2012-11-11 ENCOUNTER — Encounter: Payer: Medicare Other | Admitting: Vascular Surgery

## 2012-11-15 ENCOUNTER — Other Ambulatory Visit: Payer: Self-pay | Admitting: Internal Medicine

## 2012-11-17 NOTE — Telephone Encounter (Signed)
Call in 10 ml with 5 rf 

## 2012-11-29 ENCOUNTER — Encounter: Payer: Medicare Other | Admitting: Vascular Surgery

## 2012-12-05 ENCOUNTER — Encounter: Payer: Self-pay | Admitting: Vascular Surgery

## 2012-12-05 DIAGNOSIS — I7300 Raynaud's syndrome without gangrene: Secondary | ICD-10-CM | POA: Diagnosis not present

## 2012-12-05 DIAGNOSIS — M329 Systemic lupus erythematosus, unspecified: Secondary | ICD-10-CM | POA: Diagnosis not present

## 2012-12-05 DIAGNOSIS — G894 Chronic pain syndrome: Secondary | ICD-10-CM

## 2012-12-05 HISTORY — DX: Chronic pain syndrome: G89.4

## 2012-12-06 ENCOUNTER — Encounter: Payer: Medicare Other | Admitting: Vascular Surgery

## 2012-12-08 DIAGNOSIS — G90519 Complex regional pain syndrome I of unspecified upper limb: Secondary | ICD-10-CM | POA: Diagnosis not present

## 2012-12-08 DIAGNOSIS — I7300 Raynaud's syndrome without gangrene: Secondary | ICD-10-CM | POA: Diagnosis not present

## 2012-12-08 DIAGNOSIS — T85695A Other mechanical complication of other nervous system device, implant or graft, initial encounter: Secondary | ICD-10-CM | POA: Diagnosis not present

## 2012-12-08 DIAGNOSIS — L930 Discoid lupus erythematosus: Secondary | ICD-10-CM | POA: Diagnosis not present

## 2012-12-09 ENCOUNTER — Encounter: Payer: Medicare Other | Admitting: Vascular Surgery

## 2012-12-12 DIAGNOSIS — T85695A Other mechanical complication of other nervous system device, implant or graft, initial encounter: Secondary | ICD-10-CM | POA: Diagnosis not present

## 2012-12-12 DIAGNOSIS — S3421XA Injury of nerve root of lumbar spine, initial encounter: Secondary | ICD-10-CM | POA: Diagnosis not present

## 2012-12-12 DIAGNOSIS — L930 Discoid lupus erythematosus: Secondary | ICD-10-CM | POA: Diagnosis not present

## 2012-12-12 DIAGNOSIS — G90519 Complex regional pain syndrome I of unspecified upper limb: Secondary | ICD-10-CM | POA: Diagnosis not present

## 2012-12-14 DIAGNOSIS — IMO0002 Reserved for concepts with insufficient information to code with codable children: Secondary | ICD-10-CM | POA: Diagnosis not present

## 2012-12-14 DIAGNOSIS — Z882 Allergy status to sulfonamides status: Secondary | ICD-10-CM | POA: Diagnosis not present

## 2012-12-14 DIAGNOSIS — F909 Attention-deficit hyperactivity disorder, unspecified type: Secondary | ICD-10-CM | POA: Diagnosis present

## 2012-12-14 DIAGNOSIS — Z791 Long term (current) use of non-steroidal anti-inflammatories (NSAID): Secondary | ICD-10-CM | POA: Diagnosis not present

## 2012-12-14 DIAGNOSIS — B353 Tinea pedis: Secondary | ICD-10-CM | POA: Diagnosis present

## 2012-12-14 DIAGNOSIS — B351 Tinea unguium: Secondary | ICD-10-CM | POA: Diagnosis present

## 2012-12-14 DIAGNOSIS — B352 Tinea manuum: Secondary | ICD-10-CM | POA: Diagnosis present

## 2012-12-14 DIAGNOSIS — I96 Gangrene, not elsewhere classified: Secondary | ICD-10-CM | POA: Diagnosis present

## 2012-12-14 DIAGNOSIS — G4733 Obstructive sleep apnea (adult) (pediatric): Secondary | ICD-10-CM | POA: Diagnosis present

## 2012-12-14 DIAGNOSIS — Z86718 Personal history of other venous thrombosis and embolism: Secondary | ICD-10-CM | POA: Diagnosis not present

## 2012-12-14 DIAGNOSIS — R071 Chest pain on breathing: Secondary | ICD-10-CM | POA: Diagnosis present

## 2012-12-14 DIAGNOSIS — Z888 Allergy status to other drugs, medicaments and biological substances status: Secondary | ICD-10-CM | POA: Diagnosis not present

## 2012-12-14 DIAGNOSIS — M329 Systemic lupus erythematosus, unspecified: Secondary | ICD-10-CM | POA: Diagnosis present

## 2012-12-14 DIAGNOSIS — G9059 Complex regional pain syndrome I of other specified site: Secondary | ICD-10-CM | POA: Diagnosis present

## 2012-12-14 DIAGNOSIS — Z79899 Other long term (current) drug therapy: Secondary | ICD-10-CM | POA: Diagnosis not present

## 2012-12-14 DIAGNOSIS — T85695A Other mechanical complication of other nervous system device, implant or graft, initial encounter: Secondary | ICD-10-CM | POA: Diagnosis present

## 2012-12-14 DIAGNOSIS — E291 Testicular hypofunction: Secondary | ICD-10-CM | POA: Diagnosis present

## 2012-12-14 DIAGNOSIS — Z862 Personal history of diseases of the blood and blood-forming organs and certain disorders involving the immune mechanism: Secondary | ICD-10-CM | POA: Diagnosis not present

## 2012-12-14 DIAGNOSIS — Q602 Renal agenesis, unspecified: Secondary | ICD-10-CM | POA: Diagnosis not present

## 2012-12-14 DIAGNOSIS — Z822 Family history of deafness and hearing loss: Secondary | ICD-10-CM | POA: Diagnosis not present

## 2012-12-14 DIAGNOSIS — I7300 Raynaud's syndrome without gangrene: Secondary | ICD-10-CM | POA: Diagnosis present

## 2012-12-14 DIAGNOSIS — Z884 Allergy status to anesthetic agent status: Secondary | ICD-10-CM | POA: Diagnosis not present

## 2012-12-16 ENCOUNTER — Encounter: Payer: Self-pay | Admitting: Family Medicine

## 2012-12-19 ENCOUNTER — Ambulatory Visit: Payer: Medicare Other | Admitting: Family Medicine

## 2012-12-19 DIAGNOSIS — M545 Low back pain, unspecified: Secondary | ICD-10-CM | POA: Diagnosis not present

## 2012-12-19 DIAGNOSIS — G894 Chronic pain syndrome: Secondary | ICD-10-CM | POA: Diagnosis not present

## 2012-12-19 DIAGNOSIS — Z0289 Encounter for other administrative examinations: Secondary | ICD-10-CM

## 2012-12-19 DIAGNOSIS — Z79899 Other long term (current) drug therapy: Secondary | ICD-10-CM | POA: Diagnosis not present

## 2012-12-19 DIAGNOSIS — M542 Cervicalgia: Secondary | ICD-10-CM

## 2012-12-19 HISTORY — DX: Cervicalgia: M54.2

## 2012-12-29 ENCOUNTER — Encounter: Payer: Self-pay | Admitting: Family Medicine

## 2012-12-29 ENCOUNTER — Telehealth: Payer: Self-pay | Admitting: Family Medicine

## 2012-12-29 MED ORDER — AMPHETAMINE-DEXTROAMPHETAMINE 30 MG PO TABS: 30.0000 mg | ORAL_TABLET | Freq: Two times a day (BID) | ORAL | Status: DC

## 2012-12-29 NOTE — Telephone Encounter (Signed)
Patient called stating that he need a refill of his adderall 30mg  1po bid. Please assist. Patient is completely out and is aware of the 3 business day rule.

## 2012-12-29 NOTE — Telephone Encounter (Signed)
I spoke with pt  

## 2012-12-29 NOTE — Telephone Encounter (Signed)
Script is ready for pick up.

## 2012-12-29 NOTE — Telephone Encounter (Signed)
done

## 2013-01-19 DIAGNOSIS — B079 Viral wart, unspecified: Secondary | ICD-10-CM | POA: Diagnosis not present

## 2013-01-19 DIAGNOSIS — B351 Tinea unguium: Secondary | ICD-10-CM | POA: Diagnosis not present

## 2013-01-20 DIAGNOSIS — B351 Tinea unguium: Secondary | ICD-10-CM | POA: Diagnosis not present

## 2013-01-27 ENCOUNTER — Other Ambulatory Visit: Payer: Self-pay | Admitting: Family Medicine

## 2013-01-28 NOTE — Telephone Encounter (Signed)
Pt last seen 10/04/2012.  Rx for zolpidem last filled on 07/10/12 #30 x 5 rf.  Pls advise.

## 2013-01-31 NOTE — Telephone Encounter (Signed)
Refill Flonase and Prednisone for one year and Zolpidem for 6 months

## 2013-02-16 ENCOUNTER — Telehealth: Payer: Self-pay | Admitting: Family Medicine

## 2013-02-23 ENCOUNTER — Telehealth: Payer: Self-pay | Admitting: Family Medicine

## 2013-02-23 NOTE — Telephone Encounter (Signed)
Patient states he called 3/27 to request if he can go back to 3 pills of amphetamine-dextroamphetamine (ADDERALL) 30 MG tablet per day. He was on #3, then down to #2. Please advise and call patient. There is a phone call on 3/27 from CAN - I think -  but no documentation. Please call pt and adjust rx if he does go to 3 per day.

## 2013-02-24 MED ORDER — AMPHETAMINE-DEXTROAMPHETAMINE 30 MG PO TABS: 30.0000 mg | ORAL_TABLET | Freq: Three times a day (TID) | ORAL | Status: DC

## 2013-02-24 NOTE — Telephone Encounter (Signed)
We can try TID for a month and let me know how this goes

## 2013-02-24 NOTE — Telephone Encounter (Signed)
02/23/13: Addendum to phone encounter 02/16/13. This note was to be for the office as an attempt at leaving the patient a message for callback. RN never spoke with the patient for this encounter. CAN MKing RN

## 2013-02-24 NOTE — Telephone Encounter (Signed)
Left message on machine for patient Rx ready for pick up 

## 2013-02-27 ENCOUNTER — Ambulatory Visit: Payer: Medicare Other | Admitting: Endocrinology

## 2013-02-27 DIAGNOSIS — Z0289 Encounter for other administrative examinations: Secondary | ICD-10-CM

## 2013-03-06 ENCOUNTER — Ambulatory Visit (INDEPENDENT_AMBULATORY_CARE_PROVIDER_SITE_OTHER): Payer: Medicare Other | Admitting: Family Medicine

## 2013-03-06 ENCOUNTER — Encounter: Payer: Self-pay | Admitting: Family Medicine

## 2013-03-06 VITALS — BP 120/70 | HR 110 | Temp 99.4°F | Wt 184.0 lb

## 2013-03-06 DIAGNOSIS — J309 Allergic rhinitis, unspecified: Secondary | ICD-10-CM | POA: Diagnosis not present

## 2013-03-06 DIAGNOSIS — J069 Acute upper respiratory infection, unspecified: Secondary | ICD-10-CM

## 2013-03-06 MED ORDER — MOXIFLOXACIN HCL 400 MG PO TABS: 400.0000 mg | ORAL_TABLET | Freq: Every day | ORAL | Status: DC

## 2013-03-06 MED ORDER — CEFTRIAXONE SODIUM 1 G IJ SOLR
1.0000 g | Freq: Once | INTRAMUSCULAR | Status: AC
  Administered 2013-03-06: 1 g via INTRAMUSCULAR

## 2013-03-06 NOTE — Progress Notes (Signed)
Chief Complaint  Patient presents with  . Sinusitis    HPI:  Acute visit for sinus congestion: -started: about 4-5 days ago, thought was allergies -symptoms:nasal congestion, sore throat, cough, eye have been itchy and irritated, achy, some chills and low grade fever -denies:fever, SOB, NVD, tooth pain -has tried: flonase -sick contacts: coworker sick -Hx of: bad allergies - not taking anything, used to get allergy shots,  -has hx of recurrent sinus infection - has had cellcept and immunosuppressive drugs - reports when has this always has to do rocephin and a oral abx - usually avelox   ROS: See pertinent positives and negatives per HPI.  Past Medical History  Diagnosis Date  . Avascular necrosis     of knees from long term steroid use  . Bone pain     chronic  . Raynaud's disease   . Lupus (systemic lupus erythematosus)     Dr. Close at [facility]  . Pulmonary fibrosis   . Sleep apnea   . PE (pulmonary embolism)     hx  . Anxiety   . Hyperhydrosis disorder   . Depression   . Low back pain   . Insomnia   . GERD (gastroesophageal reflux disease)   . ADHD (attention deficit hyperactivity disorder)   . Sjogren's syndrome   . Pericarditis     resolved    Family History  Problem Relation Age of Onset  . Asthma Mother   . Fibromyalgia Mother     and aunt  . Crohn's disease      uncle  . Anemia    . Arthritis      History   Social History  . Marital Status: Legally Separated    Spouse Name: N/A    Number of Children: N/A  . Years of Education: N/A   Social History Main Topics  . Smoking status: Never Smoker   . Smokeless tobacco: Never Used  . Alcohol Use: No  . Drug Use: No  . Sexually Active: None   Other Topics Concern  . None   Social History Narrative   On disablitity    Current outpatient prescriptions:ALPRAZolam (XANAX) 1 MG tablet, TAKE 1 TABLET BY MOUTH THREE TIMES DAILY, Disp: 90 tablet, Rfl: 5;  amphetamine-dextroamphetamine  (ADDERALL) 30 MG tablet, Take 1 tablet (30 mg total) by mouth 3 (three) times daily., Disp: 90 tablet, Rfl: 0;  cloNIDine (CATAPRES) 0.1 MG tablet, Take 0.1 mg by mouth 2 (two) times daily. Pump , Disp: , Rfl:  fluticasone (FLONASE) 50 MCG/ACT nasal spray, INSTILL 2 SPRAYS IN EACH NOSTRIL DAILY, Disp: 16 g, Rfl: 11;  HYDROmorphone (DILAUDID) 8 MG tablet, Take 8 mg by mouth every 4 (four) hours as needed. 6 mcg pump, Disp: , Rfl: ;  hydroxychloroquine (PLAQUENIL) 200 MG tablet, Take 200 mg by mouth 2 (two) times daily.  , Disp: , Rfl: ;  mycophenolate (CELLCEPT) 500 MG tablet, Take 2 tablets (1,000 mg total) by mouth daily., Disp: 60 tablet, Rfl: 0 oxyCODONE (OXYCONTIN) 15 MG TB12, Take 15 mg by mouth 4 (four) times daily as needed. For pain, Disp: , Rfl: ;  predniSONE (DELTASONE) 10 MG tablet, Take 10 mg by mouth daily., Disp: , Rfl: ;  predniSONE (DELTASONE) 20 MG tablet, TAKE 1 TABLET BY MOUTH EVERY DAY, Disp: 30 tablet, Rfl: 11;  SYRINGE-NEEDLE, DISP, 3 ML (B-D SYRINGE/NEEDLE 3CC/23GX1") 23G X 1" 3 ML MISC, Per Dr Everardo All , Disp: , Rfl:  testosterone cypionate (DEPOTESTOTERONE CYPIONATE) 200 MG/ML injection, INJECT  150 MG (0.75 ML ) INTRAMUSCULARLY EVERY 2 WEEKS, Disp: 10 mL, Rfl: 5;  zolpidem (AMBIEN) 10 MG tablet, TAKE 1 TABLET BY MOUTH EVERY NIGHT AT BEDTIME AS NEEDED FOR SLEEP, Disp: 30 tablet, Rfl: 5;  moxifloxacin (AVELOX) 400 MG tablet, Take 1 tablet (400 mg total) by mouth daily., Disp: 7 tablet, Rfl: 0 tadalafil (CIALIS) 20 MG tablet, Take 1 tablet (20 mg total) by mouth daily as needed for erectile dysfunction., Disp: 10 tablet, Rfl: 11;  tadalafil (CIALIS) 5 MG tablet, Take 1 tablet (5 mg total) by mouth daily., Disp: 30 tablet, Rfl: 11 Current facility-administered medications:cefTRIAXone (ROCEPHIN) injection 1 g, 1 g, Intramuscular, Q24H, Nelwyn Salisbury, MD, 1 g at 07/29/11 1626;  cefTRIAXone (ROCEPHIN) injection 1 g, 1 g, Intramuscular, Q24H, Nelwyn Salisbury, MD, 1 g at 10/05/11  1729  EXAM:  Filed Vitals:   03/06/13 1126  BP: 120/70  Pulse: 110  Temp: 99.4 F (37.4 C)    Body mass index is 28.81 kg/(m^2).  GENERAL: vitals reviewed and listed above, alert, oriented, appears well hydrated and in no acute distress  HEENT: atraumatic, conjunttiva clear, no obvious abnormalities on inspection of external nose and ears, normal appearance of ear canals and TMs, clear nasal congestion, mild post oropharyngeal erythema with PND, no tonsillar edema or exudate, no sinus TTP  NECK: no obvious masses on inspection  LUNGS: clear to auscultation bilaterally, no wheezes, rales or rhonchi, good air movement  CV: HRRR, no peripheral edema  MS: moves all extremities without noticeable abnormality  PSYCH: pleasant and cooperative, no obvious depression or anxiety  ASSESSMENT AND PLAN:  Discussed the following assessment and plan:  Upper respiratory infection - Plan: moxifloxacin (AVELOX) 400 MG tablet  Allergic rhinitis  -possible viral/allergic or bacterial sinusitis given immunosuppression, ? fevers and prior treatment he is adamant about regimen used in the past - discussed risks/benefits.  -advised nasal saline irrigation, daily zyrtec and flonase for his allergies -Patient advised to return or notify a doctor immediately if symptoms worsen or persist or new concerns arise.  There are no Patient Instructions on file for this visit.   Kriste Basque R.

## 2013-03-06 NOTE — Addendum Note (Signed)
Addended by: Azucena Freed on: 03/06/2013 12:07 PM   Modules accepted: Orders

## 2013-04-04 DIAGNOSIS — M329 Systemic lupus erythematosus, unspecified: Secondary | ICD-10-CM | POA: Diagnosis not present

## 2013-04-04 DIAGNOSIS — M79609 Pain in unspecified limb: Secondary | ICD-10-CM

## 2013-04-04 DIAGNOSIS — G894 Chronic pain syndrome: Secondary | ICD-10-CM | POA: Diagnosis not present

## 2013-04-04 DIAGNOSIS — Z79899 Other long term (current) drug therapy: Secondary | ICD-10-CM | POA: Diagnosis not present

## 2013-04-04 DIAGNOSIS — I7300 Raynaud's syndrome without gangrene: Secondary | ICD-10-CM | POA: Diagnosis not present

## 2013-04-04 DIAGNOSIS — Z5181 Encounter for therapeutic drug level monitoring: Secondary | ICD-10-CM | POA: Diagnosis not present

## 2013-04-04 DIAGNOSIS — M545 Low back pain, unspecified: Secondary | ICD-10-CM | POA: Diagnosis not present

## 2013-04-04 DIAGNOSIS — Z7902 Long term (current) use of antithrombotics/antiplatelets: Secondary | ICD-10-CM | POA: Diagnosis not present

## 2013-04-04 HISTORY — DX: Pain in unspecified limb: M79.609

## 2013-04-28 ENCOUNTER — Telehealth: Payer: Self-pay | Admitting: Family Medicine

## 2013-04-28 MED ORDER — AMPHETAMINE-DEXTROAMPHETAMINE 30 MG PO TABS: 30.0000 mg | ORAL_TABLET | Freq: Three times a day (TID) | ORAL | Status: DC

## 2013-04-28 NOTE — Telephone Encounter (Signed)
Script is ready for pick up and I spoke with pt.  

## 2013-04-28 NOTE — Telephone Encounter (Signed)
done

## 2013-04-28 NOTE — Telephone Encounter (Signed)
Pt needs new rx generic adderall 30 mg °

## 2013-05-02 DIAGNOSIS — B351 Tinea unguium: Secondary | ICD-10-CM | POA: Diagnosis not present

## 2013-05-02 DIAGNOSIS — B079 Viral wart, unspecified: Secondary | ICD-10-CM | POA: Diagnosis not present

## 2013-05-26 ENCOUNTER — Other Ambulatory Visit: Payer: Self-pay | Admitting: Family Medicine

## 2013-05-26 ENCOUNTER — Other Ambulatory Visit: Payer: Self-pay | Admitting: Endocrinology

## 2013-05-31 NOTE — Telephone Encounter (Signed)
Call in #90 with 5 rf 

## 2013-06-06 ENCOUNTER — Other Ambulatory Visit: Payer: Self-pay | Admitting: Family Medicine

## 2013-06-06 NOTE — Telephone Encounter (Signed)
I left pt voice message, Dr. Clent Ridges is out of the office until the end of the week.

## 2013-06-07 ENCOUNTER — Telehealth: Payer: Self-pay | Admitting: Family Medicine

## 2013-06-07 NOTE — Telephone Encounter (Signed)
PT called to request a 3 month refill of his amphetamine-dextroamphetamine (ADDERALL) 30 MG tablet. Please assist.

## 2013-06-09 NOTE — Telephone Encounter (Signed)
Refill for 6 months. 

## 2013-06-09 NOTE — Telephone Encounter (Signed)
Pt came in to office to pick up script, he was advised script was not ready.  Pt asked to speak with manager.  I informed the patient of our refill policy and that he should allowi 3 business days for refills to be approved.  Pt states he understands but that he ran out of medication on Wednesday and that he called this morning at 9am and notified whoever answered the phone that he needed his medication today.  I informed the patient that Dr. Clent Ridges is out of office until Monday.  Pt requesting to pick up script first thing Monday morning.

## 2013-06-12 MED ORDER — AMPHETAMINE-DEXTROAMPHETAMINE 30 MG PO TABS: 30.0000 mg | ORAL_TABLET | Freq: Three times a day (TID) | ORAL | Status: DC

## 2013-06-12 NOTE — Telephone Encounter (Signed)
done

## 2013-06-12 NOTE — Telephone Encounter (Signed)
Scripts are ready for pick up and I spoke with pt. 

## 2013-08-01 DIAGNOSIS — G894 Chronic pain syndrome: Secondary | ICD-10-CM | POA: Diagnosis not present

## 2013-08-01 DIAGNOSIS — I7300 Raynaud's syndrome without gangrene: Secondary | ICD-10-CM | POA: Diagnosis not present

## 2013-08-01 DIAGNOSIS — M545 Low back pain, unspecified: Secondary | ICD-10-CM | POA: Diagnosis not present

## 2013-08-01 DIAGNOSIS — M79609 Pain in unspecified limb: Secondary | ICD-10-CM | POA: Diagnosis not present

## 2013-08-01 DIAGNOSIS — M329 Systemic lupus erythematosus, unspecified: Secondary | ICD-10-CM | POA: Diagnosis not present

## 2013-08-22 ENCOUNTER — Other Ambulatory Visit: Payer: Self-pay | Admitting: Family Medicine

## 2013-08-23 NOTE — Telephone Encounter (Signed)
Call in #30 with 5 rf 

## 2013-09-06 ENCOUNTER — Ambulatory Visit: Payer: Medicare Other | Admitting: Family Medicine

## 2013-09-06 ENCOUNTER — Encounter: Payer: Self-pay | Admitting: Internal Medicine

## 2013-09-06 ENCOUNTER — Ambulatory Visit (INDEPENDENT_AMBULATORY_CARE_PROVIDER_SITE_OTHER): Payer: Medicare Other | Admitting: Internal Medicine

## 2013-09-06 VITALS — BP 112/78 | HR 107 | Temp 97.3°F | Wt 185.0 lb

## 2013-09-06 DIAGNOSIS — M549 Dorsalgia, unspecified: Secondary | ICD-10-CM | POA: Diagnosis not present

## 2013-09-06 DIAGNOSIS — M329 Systemic lupus erythematosus, unspecified: Secondary | ICD-10-CM

## 2013-09-06 DIAGNOSIS — J180 Bronchopneumonia, unspecified organism: Secondary | ICD-10-CM | POA: Diagnosis not present

## 2013-09-06 MED ORDER — CEFTRIAXONE SODIUM 1 G IJ SOLR
1.0000 g | Freq: Once | INTRAMUSCULAR | Status: AC
  Administered 2013-09-06: 1 g via INTRAMUSCULAR

## 2013-09-06 MED ORDER — MOXIFLOXACIN HCL 400 MG PO TABS: 400.0000 mg | ORAL_TABLET | Freq: Every day | ORAL | Status: DC

## 2013-09-06 MED ORDER — ALBUTEROL SULFATE HFA 108 (90 BASE) MCG/ACT IN AERS: 2.0000 | INHALATION_SPRAY | Freq: Four times a day (QID) | RESPIRATORY_TRACT | Status: DC | PRN

## 2013-09-06 NOTE — Progress Notes (Signed)
  Subjective:    HPI  complains of cough Onset 5 days ago - progressively worse Associated with chest congestion and barking cough -green sputum Denies fever History of same -reports treatment with Rocephin and Avelox as usual effective therapy  Past Medical History  Diagnosis Date  . Avascular necrosis     of knees from long term steroid use  . Bone pain     chronic  . Raynaud's disease   . Lupus (systemic lupus erythematosus)     Dr. Close at [facility] Rhe[facility]ology  . Pulmonary fibrosis   . Sleep apnea   . PE (pulmonary embolism)     hx  . Anxiety   . Hyperhydrosis disorder   . Depression   . Low back pain   . Insomnia   . GERD (gastroesophageal reflux disease)   . ADHD (attention deficit hyperactivity disorder)   . Sjogren's syndrome   . Pericarditis     resolved    Review of Systems Constitutional: No fever, no unexpected weight change Pulmonary: No pleurisy or hemoptysis Cardiovascular: No chest pain or palpitations     Objective:   Physical Exam BP 112/78  Pulse 107  Temp(Src) 97.3 F (36.3 C) (Oral)  Wt 185 lb (83.915 kg)  BMI 28.97 kg/m2  SpO2 95% GEN: mildly ill appearing and audible chest congestion HENT: NCAT, no sinus tenderness bilaterally, nares with clear discharge, oropharynx mild erythema, no exudate Eyes: Vision grossly intact, no conjunctivitis Lungs: coarse bibasilar rhonchi with mild end expiratory wheeze R>L, no increased work of breathing, but ongoing cough spasms Cardiovascular: Regular rate and rhythm, no bilateral edema  Lab Results  Component Value Date   WBC 4.8 05/05/2012   HGB 16.0 05/05/2012   HCT 48.5 05/05/2012   PLT 137.0* 05/05/2012   GLUCOSE 87 05/05/2012   ALT 14 08/08/2012   AST 21 08/08/2012   NA 134* 05/05/2012   K 4.0 05/05/2012   CL 96 05/05/2012   CREATININE 1.3 05/05/2012   BUN 16 05/05/2012   CO2 30 05/05/2012   TSH 1.77 03/26/2009   PSA 0.78 02/29/2012      Assessment & Plan:   Bronchopneumonia causing cough   Chronic immunosuppression due to lupus and chronic prednisone tx   1 g Rocephin IM administered today Continue antibiotic therapy with one week Avelox - erx done Patient declines burst steroids, continue prednisone at 10 mg daily as ongoing for lupus Albuterol inhaler to use as needed for cough given bronchospasm with wheeze on exam Continue symptomatic care with Tylenol or Advil, hydration and rest - also usual narcotic pain regimen reviewed Patient agrees to call if symptoms unimproved in next 5-7 days, sooner if worse

## 2013-09-06 NOTE — Assessment & Plan Note (Signed)
Intra-thecal Dilaudid pump pain management since 2006 Also uses OxyContin 15 mg 2 to 4 times a day as needed

## 2013-09-06 NOTE — Assessment & Plan Note (Signed)
Follows at [facility] rheumatology for same, chronic prednisone reviewed Denies recent flares No changes recommended today

## 2013-09-06 NOTE — Patient Instructions (Signed)
It was good to see you today.  We have reviewed your prior records including labs and tests today  Rocephin injection provided today  Avelox once daily for the next 7 days and albuterol inhaler as needed for cough and wheeze  Your prescription(s) have been submitted to your pharmacy. Please take as directed and contact our office if you believe you are having problem(s) with the medication(s).  Continue other medications as ongoing and reviewed today, no additional changes  Hydrate, rest -  call if symptoms unimproved in next 7-10 days, sooner if worse

## 2013-09-08 ENCOUNTER — Telehealth: Payer: Self-pay | Admitting: Family Medicine

## 2013-09-08 NOTE — Telephone Encounter (Signed)
Pt needs new rx generic adderall 30 mg. Pt would like rxs for next 3 months

## 2013-09-11 MED ORDER — AMPHETAMINE-DEXTROAMPHETAMINE 30 MG PO TABS: 30.0000 mg | ORAL_TABLET | Freq: Three times a day (TID) | ORAL | Status: DC

## 2013-09-11 NOTE — Telephone Encounter (Signed)
done

## 2013-09-11 NOTE — Telephone Encounter (Signed)
Pt is leaving town this afternoon

## 2013-10-24 DIAGNOSIS — M79609 Pain in unspecified limb: Secondary | ICD-10-CM | POA: Diagnosis not present

## 2013-10-24 DIAGNOSIS — I7300 Raynaud's syndrome without gangrene: Secondary | ICD-10-CM | POA: Diagnosis not present

## 2013-10-24 DIAGNOSIS — G894 Chronic pain syndrome: Secondary | ICD-10-CM | POA: Diagnosis not present

## 2013-10-24 DIAGNOSIS — M329 Systemic lupus erythematosus, unspecified: Secondary | ICD-10-CM | POA: Diagnosis not present

## 2013-11-09 ENCOUNTER — Ambulatory Visit: Payer: Medicare Other | Admitting: Physician Assistant

## 2013-11-09 ENCOUNTER — Encounter (HOSPITAL_BASED_OUTPATIENT_CLINIC_OR_DEPARTMENT_OTHER): Payer: Self-pay | Admitting: Emergency Medicine

## 2013-11-09 ENCOUNTER — Observation Stay (HOSPITAL_BASED_OUTPATIENT_CLINIC_OR_DEPARTMENT_OTHER)
Admission: EM | Admit: 2013-11-09 | Discharge: 2013-11-10 | Disposition: A | Discharge status: Home / Self Care | Payer: Medicare Other | Attending: Internal Medicine | Admitting: Internal Medicine

## 2013-11-09 ENCOUNTER — Emergency Department (HOSPITAL_BASED_OUTPATIENT_CLINIC_OR_DEPARTMENT_OTHER): Payer: Medicare Other

## 2013-11-09 DIAGNOSIS — R197 Diarrhea, unspecified: Secondary | ICD-10-CM

## 2013-11-09 DIAGNOSIS — M3390 Dermatopolymyositis, unspecified, organ involvement unspecified: Secondary | ICD-10-CM

## 2013-11-09 DIAGNOSIS — E230 Hypopituitarism: Secondary | ICD-10-CM

## 2013-11-09 DIAGNOSIS — N529 Male erectile dysfunction, unspecified: Secondary | ICD-10-CM

## 2013-11-09 DIAGNOSIS — M545 Low back pain, unspecified: Secondary | ICD-10-CM | POA: Insufficient documentation

## 2013-11-09 DIAGNOSIS — F909 Attention-deficit hyperactivity disorder, unspecified type: Secondary | ICD-10-CM | POA: Diagnosis not present

## 2013-11-09 DIAGNOSIS — E291 Testicular hypofunction: Secondary | ICD-10-CM

## 2013-11-09 DIAGNOSIS — F329 Major depressive disorder, single episode, unspecified: Secondary | ICD-10-CM | POA: Diagnosis not present

## 2013-11-09 DIAGNOSIS — IMO0002 Reserved for concepts with insufficient information to code with codable children: Secondary | ICD-10-CM

## 2013-11-09 DIAGNOSIS — I319 Disease of pericardium, unspecified: Secondary | ICD-10-CM

## 2013-11-09 DIAGNOSIS — G4730 Sleep apnea, unspecified: Secondary | ICD-10-CM | POA: Insufficient documentation

## 2013-11-09 DIAGNOSIS — R61 Generalized hyperhidrosis: Secondary | ICD-10-CM

## 2013-11-09 DIAGNOSIS — M329 Systemic lupus erythematosus, unspecified: Secondary | ICD-10-CM | POA: Diagnosis not present

## 2013-11-09 DIAGNOSIS — J8410 Pulmonary fibrosis, unspecified: Secondary | ICD-10-CM | POA: Diagnosis not present

## 2013-11-09 DIAGNOSIS — K922 Gastrointestinal hemorrhage, unspecified: Secondary | ICD-10-CM

## 2013-11-09 DIAGNOSIS — I7300 Raynaud's syndrome without gangrene: Secondary | ICD-10-CM

## 2013-11-09 DIAGNOSIS — M899 Disorder of bone, unspecified: Secondary | ICD-10-CM | POA: Diagnosis not present

## 2013-11-09 DIAGNOSIS — M3500 Sicca syndrome, unspecified: Secondary | ICD-10-CM | POA: Insufficient documentation

## 2013-11-09 DIAGNOSIS — K209 Esophagitis, unspecified without bleeding: Secondary | ICD-10-CM | POA: Insufficient documentation

## 2013-11-09 DIAGNOSIS — R5381 Other malaise: Secondary | ICD-10-CM | POA: Diagnosis not present

## 2013-11-09 DIAGNOSIS — F411 Generalized anxiety disorder: Secondary | ICD-10-CM | POA: Diagnosis not present

## 2013-11-09 DIAGNOSIS — R091 Pleurisy: Secondary | ICD-10-CM

## 2013-11-09 DIAGNOSIS — K529 Noninfective gastroenteritis and colitis, unspecified: Secondary | ICD-10-CM

## 2013-11-09 DIAGNOSIS — D72819 Decreased white blood cell count, unspecified: Secondary | ICD-10-CM

## 2013-11-09 DIAGNOSIS — J0120 Acute ethmoidal sinusitis, unspecified: Secondary | ICD-10-CM

## 2013-11-09 DIAGNOSIS — R51 Headache: Secondary | ICD-10-CM

## 2013-11-09 DIAGNOSIS — F3289 Other specified depressive episodes: Secondary | ICD-10-CM | POA: Insufficient documentation

## 2013-11-09 DIAGNOSIS — N320 Bladder-neck obstruction: Secondary | ICD-10-CM

## 2013-11-09 DIAGNOSIS — R1013 Epigastric pain: Secondary | ICD-10-CM | POA: Diagnosis not present

## 2013-11-09 DIAGNOSIS — G4700 Insomnia, unspecified: Secondary | ICD-10-CM | POA: Diagnosis not present

## 2013-11-09 DIAGNOSIS — K920 Hematemesis: Secondary | ICD-10-CM

## 2013-11-09 DIAGNOSIS — M3313 Other dermatomyositis without myopathy: Secondary | ICD-10-CM

## 2013-11-09 DIAGNOSIS — K219 Gastro-esophageal reflux disease without esophagitis: Secondary | ICD-10-CM | POA: Diagnosis not present

## 2013-11-09 DIAGNOSIS — N289 Disorder of kidney and ureter, unspecified: Secondary | ICD-10-CM | POA: Diagnosis not present

## 2013-11-09 DIAGNOSIS — Z0289 Encounter for other administrative examinations: Secondary | ICD-10-CM

## 2013-11-09 DIAGNOSIS — R112 Nausea with vomiting, unspecified: Principal | ICD-10-CM

## 2013-11-09 DIAGNOSIS — Z86718 Personal history of other venous thrombosis and embolism: Secondary | ICD-10-CM

## 2013-11-09 DIAGNOSIS — M549 Dorsalgia, unspecified: Secondary | ICD-10-CM

## 2013-11-09 DIAGNOSIS — F331 Major depressive disorder, recurrent, moderate: Secondary | ICD-10-CM

## 2013-11-09 HISTORY — DX: Gastrointestinal hemorrhage, unspecified: K92.2

## 2013-11-09 LAB — OCCULT BLOOD GASTRIC / DUODENUM (SPECIMEN CUP)
Occult Blood, Gastric: POSITIVE — AB
pH, Gastric: 3

## 2013-11-09 LAB — CBC WITH DIFFERENTIAL/PLATELET
Basophils Absolute: 0 10*3/uL (ref 0.0–0.1)
Basophils Relative: 0 % (ref 0–1)
Eosinophils Absolute: 0.1 10*3/uL (ref 0.0–0.7)
Eosinophils Relative: 1 % (ref 0–5)
HCT: 56.4 % — ABNORMAL HIGH (ref 39.0–52.0)
Hemoglobin: 18.7 g/dL — ABNORMAL HIGH (ref 13.0–17.0)
Lymphocytes Relative: 11 % — ABNORMAL LOW (ref 12–46)
Lymphs Abs: 1.1 10*3/uL (ref 0.7–4.0)
MCH: 31.8 pg (ref 26.0–34.0)
MCHC: 33.2 g/dL (ref 30.0–36.0)
MCV: 95.9 fL (ref 78.0–100.0)
Monocytes Absolute: 0.9 10*3/uL (ref 0.1–1.0)
Monocytes Relative: 9 % (ref 3–12)
Neutro Abs: 7.6 10*3/uL (ref 1.7–7.7)
Neutrophils Relative %: 79 % — ABNORMAL HIGH (ref 43–77)
Platelets: 158 10*3/uL (ref 150–400)
RBC: 5.88 MIL/uL — ABNORMAL HIGH (ref 4.22–5.81)
RDW: 14.9 % (ref 11.5–15.5)
WBC: 9.7 10*3/uL (ref 4.0–10.5)

## 2013-11-09 LAB — COMPREHENSIVE METABOLIC PANEL
ALT: 13 U/L (ref 0–53)
AST: 20 U/L (ref 0–37)
Albumin: 3.8 g/dL (ref 3.5–5.2)
Alkaline Phosphatase: 64 U/L (ref 39–117)
BUN: 17 mg/dL (ref 6–23)
CO2: 31 mEq/L (ref 19–32)
Calcium: 9.4 mg/dL (ref 8.4–10.5)
Chloride: 100 mEq/L (ref 96–112)
Creatinine, Ser: 1.2 mg/dL (ref 0.50–1.35)
GFR calc Af Amer: 84 mL/min — ABNORMAL LOW (ref >90)
GFR calc non Af Amer: 73 mL/min — ABNORMAL LOW (ref >90)
Glucose, Bld: 94 mg/dL (ref 70–99)
Potassium: 4 mEq/L (ref 3.5–5.1)
Sodium: 139 mEq/L (ref 135–145)
Total Bilirubin: 0.9 mg/dL (ref 0.3–1.2)
Total Protein: 8.6 g/dL — ABNORMAL HIGH (ref 6.0–8.3)

## 2013-11-09 LAB — URINALYSIS, ROUTINE W REFLEX MICROSCOPIC
Glucose, UA: NEGATIVE mg/dL
Hgb urine dipstick: NEGATIVE
Ketones, ur: 15 mg/dL — AB
Nitrite: NEGATIVE
Protein, ur: 30 mg/dL — AB
Specific Gravity, Urine: 1.03 (ref 1.005–1.030)
Urobilinogen, UA: 0.2 mg/dL (ref 0.0–1.0)
pH: 6 (ref 5.0–8.0)

## 2013-11-09 LAB — URINE MICROSCOPIC-ADD ON

## 2013-11-09 LAB — LIPASE, BLOOD: Lipase: 23 U/L (ref 11–59)

## 2013-11-09 LAB — CG4 I-STAT (LACTIC ACID): Lactic Acid, Venous: 0.55 mmol/L (ref 0.5–2.2)

## 2013-11-09 MED ORDER — IOHEXOL 300 MG/ML  SOLN
100.0000 mL | Freq: Once | INTRAMUSCULAR | Status: AC | PRN
  Administered 2013-11-09: 100 mL via INTRAVENOUS

## 2013-11-09 MED ORDER — ONDANSETRON HCL 4 MG/2ML IJ SOLN: 4.0000 mg | Freq: Three times a day (TID) | INTRAMUSCULAR | Status: DC | PRN

## 2013-11-09 MED ORDER — IOHEXOL 300 MG/ML  SOLN
50.0000 mL | Freq: Once | INTRAMUSCULAR | Status: AC | PRN
  Administered 2013-11-09: 50 mL via ORAL

## 2013-11-09 MED ORDER — PANTOPRAZOLE SODIUM 40 MG IV SOLR
40.0000 mg | Freq: Once | INTRAVENOUS | Status: AC
  Administered 2013-11-09: 40 mg via INTRAVENOUS
  Filled 2013-11-09: qty 40

## 2013-11-09 MED ORDER — HYDROMORPHONE HCL PF 1 MG/ML IJ SOLN: 1.0000 mg | INTRAMUSCULAR | Status: DC | PRN

## 2013-11-09 MED ORDER — SODIUM CHLORIDE 0.9 % IV BOLUS (SEPSIS)
1000.0000 mL | Freq: Once | INTRAVENOUS | Status: AC
  Administered 2013-11-09: 1000 mL via INTRAVENOUS

## 2013-11-09 MED ORDER — SODIUM CHLORIDE 0.9 % IV SOLN: INTRAVENOUS | Status: DC

## 2013-11-09 MED ORDER — ONDANSETRON HCL 4 MG/2ML IJ SOLN
4.0000 mg | Freq: Once | INTRAMUSCULAR | Status: AC
  Administered 2013-11-09: 4 mg via INTRAVENOUS
  Filled 2013-11-09: qty 2
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
## 2013-11-09 NOTE — ED Notes (Signed)
Will get vitals after Pt returns from CT

## 2013-11-09 NOTE — ED Notes (Signed)
Onset yesterday  With nausea  Vomiting and diarrhea.  Hx Lupus

## 2013-11-09 NOTE — ED Notes (Signed)
NG tube removed. Pt tolerated procedure well

## 2013-11-10 ENCOUNTER — Encounter (HOSPITAL_COMMUNITY): Payer: Self-pay | Admitting: Emergency Medicine

## 2013-11-10 ENCOUNTER — Encounter (HOSPITAL_COMMUNITY)
Admission: EM | Disposition: A | Discharge status: Home / Self Care | Payer: Self-pay | Source: Home / Self Care | Attending: Emergency Medicine

## 2013-11-10 DIAGNOSIS — R197 Diarrhea, unspecified: Secondary | ICD-10-CM

## 2013-11-10 DIAGNOSIS — F909 Attention-deficit hyperactivity disorder, unspecified type: Secondary | ICD-10-CM | POA: Diagnosis not present

## 2013-11-10 DIAGNOSIS — K5289 Other specified noninfective gastroenteritis and colitis: Secondary | ICD-10-CM

## 2013-11-10 DIAGNOSIS — R112 Nausea with vomiting, unspecified: Secondary | ICD-10-CM

## 2013-11-10 DIAGNOSIS — M3390 Dermatopolymyositis, unspecified, organ involvement unspecified: Secondary | ICD-10-CM | POA: Diagnosis not present

## 2013-11-10 DIAGNOSIS — K920 Hematemesis: Secondary | ICD-10-CM | POA: Diagnosis present

## 2013-11-10 DIAGNOSIS — M329 Systemic lupus erythematosus, unspecified: Secondary | ICD-10-CM

## 2013-11-10 DIAGNOSIS — IMO0002 Reserved for concepts with insufficient information to code with codable children: Secondary | ICD-10-CM

## 2013-11-10 DIAGNOSIS — I7300 Raynaud's syndrome without gangrene: Secondary | ICD-10-CM

## 2013-11-10 HISTORY — DX: Nausea with vomiting, unspecified: R11.2

## 2013-11-10 HISTORY — PX: ESOPHAGOGASTRODUODENOSCOPY: SHX5428

## 2013-11-10 HISTORY — DX: Hematemesis: K92.0

## 2013-11-10 HISTORY — DX: Diarrhea, unspecified: R19.7

## 2013-11-10 HISTORY — DX: Reserved for concepts with insufficient information to code with codable children: IMO0002

## 2013-11-10 LAB — CBC
HCT: 50.1 % (ref 39.0–52.0)
Hemoglobin: 16.9 g/dL (ref 13.0–17.0)
MCH: 33.1 pg (ref 26.0–34.0)
MCHC: 33.7 g/dL (ref 30.0–36.0)
MCV: 98 fL (ref 78.0–100.0)
Platelets: 132 10*3/uL — ABNORMAL LOW (ref 150–400)
RBC: 5.11 MIL/uL (ref 4.22–5.81)
RDW: 14.6 % (ref 11.5–15.5)
WBC: 7.9 10*3/uL (ref 4.0–10.5)

## 2013-11-10 LAB — PROTIME-INR
INR: 1.01 (ref 0.00–1.49)
Prothrombin Time: 13.1 seconds (ref 11.6–15.2)

## 2013-11-10 SURGERY — EGD (ESOPHAGOGASTRODUODENOSCOPY)
Anesthesia: Moderate Sedation

## 2013-11-10 MED ORDER — PANTOPRAZOLE SODIUM 40 MG IV SOLR
40.0000 mg | Freq: Two times a day (BID) | INTRAVENOUS | Status: DC
  Administered 2013-11-10: 40 mg via INTRAVENOUS
  Filled 2013-11-10 (×2): qty 40

## 2013-11-10 MED ORDER — PANTOPRAZOLE SODIUM 40 MG PO TBEC: 40.0000 mg | DELAYED_RELEASE_TABLET | Freq: Two times a day (BID) | ORAL | Status: DC

## 2013-11-10 MED ORDER — MIDAZOLAM HCL 5 MG/ML IJ SOLN
INTRAMUSCULAR | Status: AC
  Filled 2013-11-10: qty 2

## 2013-11-10 MED ORDER — ONDANSETRON HCL 4 MG PO TABS: 4.0000 mg | ORAL_TABLET | Freq: Four times a day (QID) | ORAL | Status: DC | PRN

## 2013-11-10 MED ORDER — ONDANSETRON HCL 4 MG/2ML IJ SOLN: 4.0000 mg | Freq: Four times a day (QID) | INTRAMUSCULAR | Status: DC | PRN

## 2013-11-10 MED ORDER — INFLUENZA VAC SPLIT QUAD 0.5 ML IM SUSP
0.5000 mL | Freq: Once | INTRAMUSCULAR | Status: AC
  Administered 2013-11-10: 0.5 mL via INTRAMUSCULAR
  Filled 2013-11-10 (×2): qty 0.5

## 2013-11-10 MED ORDER — FENTANYL CITRATE 0.05 MG/ML IJ SOLN
INTRAMUSCULAR | Status: AC
  Filled 2013-11-10: qty 2

## 2013-11-10 MED ORDER — INFLUENZA VAC SPLIT QUAD 0.5 ML IM SUSP
0.5000 mL | INTRAMUSCULAR | Status: DC
  Filled 2013-11-10: qty 0.5

## 2013-11-10 MED ORDER — SODIUM CHLORIDE 0.9 % IV SOLN
INTRAVENOUS | Status: AC
  Administered 2013-11-10: 03:00:00 via INTRAVENOUS
  Administered 2013-11-10: 125 mL/h via INTRAVENOUS

## 2013-11-10 MED ORDER — SODIUM CHLORIDE 0.9 % IV SOLN: INTRAVENOUS | Status: DC

## 2013-11-10 MED ORDER — PANTOPRAZOLE SODIUM 40 MG IV SOLR
40.0000 mg | INTRAVENOUS | Status: DC
  Filled 2013-11-10: qty 40

## 2013-11-10 MED ORDER — PREDNISONE 10 MG PO TABS
10.0000 mg | ORAL_TABLET | Freq: Every day | ORAL | Status: DC
  Administered 2013-11-10: 10 mg via ORAL
  Filled 2013-11-10: qty 1

## 2013-11-10 MED ORDER — OXYCODONE HCL ER 15 MG PO T12A
15.0000 mg | EXTENDED_RELEASE_TABLET | Freq: Four times a day (QID) | ORAL | Status: DC | PRN
  Administered 2013-11-10: 15 mg via ORAL
  Filled 2013-11-10: qty 1

## 2013-11-10 MED ORDER — FENTANYL CITRATE 0.05 MG/ML IJ SOLN
INTRAMUSCULAR | Status: DC | PRN
  Administered 2013-11-10 (×2): 25 ug via INTRAVENOUS

## 2013-11-10 MED ORDER — MIDAZOLAM HCL 10 MG/2ML IJ SOLN
INTRAMUSCULAR | Status: DC | PRN
  Administered 2013-11-10 (×3): 2 mg via INTRAVENOUS
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
## 2013-11-10 NOTE — Discharge Summary (Signed)
Physician Discharge Summary  NUH LIPTON ZOX:[id number] DOB: 09/04/1971 DOA: 11/09/2013  PCP: Nelwyn Salisbury, MD  Admit date: 11/09/2013 Discharge date: 11/10/2013  Time spent: 40 minutes  Recommendations for Outpatient Follow-up:  1. Follow up with PCP for BMET / CBC in 3-5 days.  Discharge Diagnoses:  Principal Problem:   GI bleed Active Problems:   PITUITARY INSUFFICIENCY   LEUKOPENIA, CHRONIC   Generalized anxiety disorder   INTERSTITIAL LUNG DISEASE   LUPUS   BACK PAIN, CHRONIC   PULMONARY EMBOLISM, HX OF   N&V (nausea and vomiting)   Coffee ground emesis   Diarrhea   Chronic steroid use   Hematemesis   Discharge Condition: stable,  able to tolerate solid diet.  Diet recommendation: soft bland diet until stronger.  Filed Weights   11/09/13 2026 11/09/13 2028 11/10/13 0135  Weight: 79.379 kg (175 lb) 80.287 kg (177 lb) 79.289 kg (174 lb 12.8 oz)    History of present illness:  42 yo male with h/o lupus, raynauds, chronic steroids at pred 10mg  daily, intrthecal pain pump comes in from transfer from [facility] urgent care with hematemesis.  The patient and his family had been suffering for two days with viral gastroenteritis causing vomiting and diarrhea.  After vomiting multiple times, he experienced coffee ground emesis.  Hospital Course:   Hematemesis Resolved.  Last episode was Wednesday evening. Treated with IV protonix as an inpatient. Concerning for [employer] tear.  GI was consulted.  Dr. Elnoria Howard performed endoscopy 12/19 and found minimal esophagitis. Patient's hgb is stable (elevated).  It was 18.7 on admission and 16.9 at the time of discharge.  Viral gastroenteritis Appears resolved.  No further vomiting or diarrhea Received supportive care with antiemetics and IV fluids. Tolerating a diet after upper endoscopy.  Thrombocytopenia Review of labs indicates platelets are chronically low. Stable for outpatient follow up.  Co-morbidities including  lupus, sleep apnea, pulmonary fibrosis, ADHD and Raynaud's remained quiet and stable during this brief hospitalization.   Procedures:  12/19 EGD   Consultations:  GI  Discharge Exam: Filed Vitals:   11/10/13 1240  BP: 99/61  Pulse:   Temp:   Resp: 22    General: Awake, Alert, NAD, lying in bed.  Appears tired. Cardiovascular: rrr no m/r/g Respiratory: cta, no w/c/r Abdomen:  Soft, nt, nd, +bs, no masses.  Patient has a pump implanted under the skin for chronic pain. Extremities:  5/5 strength in each.  No swelling.  Discharge Instructions     Medication List         ALPRAZolam 1 MG tablet  Commonly known as:  XANAX  Take 1 mg by mouth 3 (three) times daily as needed for anxiety.     amphetamine-dextroamphetamine 30 MG tablet  Commonly known as:  ADDERALL  Take 1 tablet (30 mg total) by mouth 3 (three) times daily.     B-D SYRINGE/NEEDLE 3CC/23GX1" 23G X 1" 3 ML Misc  Generic drug:  SYRINGE-NEEDLE (DISP) 3 ML  Per Dr Everardo All     HYDROmorphone 8 MG tablet  Commonly known as:  DILAUDID  Take 8 mg by mouth every 4 (four) hours as needed. 6 mcg pump     oxyCODONE 15 MG Tb12  Commonly known as:  OXYCONTIN  Take 15 mg by mouth 4 (four) times daily as needed. For pain     predniSONE 10 MG tablet  Commonly known as:  DELTASONE  Take 10 mg by mouth daily.     promethazine 12.5 MG tablet  Commonly known as:  PHENERGAN  Take 6.25 mg by mouth every 6 (six) hours as needed for nausea or vomiting.     testosterone cypionate 200 MG/ML injection  Commonly known as:  DEPOTESTOTERONE CYPIONATE  INJECT.75 MILLILITERS INTO THE MUSCLE EVERY 2 WEEKS     zolpidem 10 MG tablet  Commonly known as:  AMBIEN  TAKE 1 TABLET BY MOUTH ONCE DAILY AT BEDTIME       Allergies  Allergen Reactions  . Sulfamethoxazole     REACTION: rash  . Sulfamethoxazole-Trimethoprim     REACTION: unspecified   Follow-up Information   Follow up with FRY,STEPHEN A, MD. Schedule an  appointment as soon as possible for a visit in 3 days.   Specialty:  Family Medicine   Contact information:   [address] [place] [address] [phone]        The results of significant diagnostics from this hospitalization (including imaging, microbiology, ancillary and laboratory) are listed below for reference.    Significant Diagnostic Studies: Ct Abdomen Pelvis W Contrast  11/09/2013   CLINICAL DATA:  Nausea, vomiting, diarrhea, cough, fever, fatigue, history lupus, pulmonary fibrosis, pulmonary embolism, pericarditis, GERD  EXAM: CT ABDOMEN AND PELVIS WITH CONTRAST  TECHNIQUE: Multidetector CT imaging of the abdomen and pelvis was performed using the standard protocol following bolus administration of intravenous contrast. Sagittal and coronal MPR images reconstructed from axial data set.  CONTRAST:  50mL OMNIPAQUE IOHEXOL 300 MG/ML SOLN orally, OMNIPAQUE IOHEXOL 300 MG/ML SOLN IV  COMPARISON:  04/14/2005  FINDINGS: Atelectasis and scarring at lung bases.  Nasogastric tube extends into stomach.  Contracted gallbladder.  Absent right kidney, question tiny dysplastic renal remnant/tissue at right renal fossa.  Liver, spleen, pancreas, left kidney, and adrenal glands normal.  Generator packs are identified in the anterior right abdomen and a posterior right pelvis.  No left hydronephrosis identified.  Mild dilatation of the distal left ureter up to 17 mm diameter without ureteral calcification.  Right inguinal hernia containing fat.  Stomach and bowel loops normal appearance.  Normal appendix.  No mass, adenopathy, free fluid or inflammatory process.  Contracted urinary bladder, poorly assessed.  Mild sclerosis at the femoral heads bilaterally compatible with avascular necrosis.  Femoral head morphology appears maintained bilaterally.  No acute osseous findings.  IMPRESSION: Solitary left kidney with mild dilatation of the mid to distal left ureter without identified ureteral  calcification or mass.  Right inguinal hernia containing fat.  Bilateral avascular necrosis of the femoral heads.  No other intra-abdominal or intrapelvic abnormalities identified.   Electronically Signed   By: Ulyses Southward M.D.   On: 11/09/2013 22:21     Labs: Basic Metabolic Panel:  Recent Labs Lab 11/09/13 2100  NA 139  K 4.0  CL 100  CO2 31  GLUCOSE 94  BUN 17  CREATININE 1.20  CALCIUM 9.4   Liver Function Tests:  Recent Labs Lab 11/09/13 2100  AST 20  ALT 13  ALKPHOS 64  BILITOT 0.9  PROT 8.6*  ALBUMIN 3.8    Recent Labs Lab 11/09/13 2100  LIPASE 23   CBC:  Recent Labs Lab 11/09/13 2100 11/10/13 0636  WBC 9.7 7.9  NEUTROABS 7.6  --   HGB 18.7* 16.9  HCT 56.4* 50.1  MCV 95.9 98.0  PLT 158 132*     Signed:  Conley Canal [phone]  [facility] 11/10/2013, 1:35 PM  Attending Patient seen and examined the, agree with the above assessment and plan.  Admitted with nausea, vomiting and a few episodes of hematemesis. All of these have resolved since admission, hemoglobin remains stable. EGD done today did not show any major changes, he was given a regular diet after EGD which he has tolerated. GI has no further recommendations, patient is stable for discharge.  Windell Norfolk MD

## 2013-11-10 NOTE — Discharge Summary (Signed)
[name] to be D/C'd Home per MD order.  Discussed with the patient and all questions fully answered.    Medication List         ALPRAZolam 1 MG tablet  Commonly known as:  XANAX  Take 1 mg by mouth 3 (three) times daily as needed for anxiety.     amphetamine-dextroamphetamine 30 MG tablet  Commonly known as:  ADDERALL  Take 1 tablet (30 mg total) by mouth 3 (three) times daily.     B-D SYRINGE/NEEDLE 3CC/23GX1" 23G X 1" 3 ML Misc  Generic drug:  SYRINGE-NEEDLE (DISP) 3 ML  Per Dr Everardo All     HYDROmorphone 8 MG tablet  Commonly known as:  DILAUDID  Take 8 mg by mouth every 4 (four) hours as needed. 6 mcg pump     oxyCODONE 15 MG Tb12  Commonly known as:  OXYCONTIN  Take 15 mg by mouth 4 (four) times daily as needed. For pain     predniSONE 10 MG tablet  Commonly known as:  DELTASONE  Take 10 mg by mouth daily.     promethazine 12.5 MG tablet  Commonly known as:  PHENERGAN  Take 6.25 mg by mouth every 6 (six) hours as needed for nausea or vomiting.     testosterone cypionate 200 MG/ML injection  Commonly known as:  DEPOTESTOTERONE CYPIONATE  INJECT.75 MILLILITERS INTO THE MUSCLE EVERY 2 WEEKS     zolpidem 10 MG tablet  Commonly known as:  AMBIEN  TAKE 1 TABLET BY MOUTH ONCE DAILY AT BEDTIME        VVS, Skin clean, dry and intact without evidence of skin break down, no evidence of skin tears noted. IV catheter discontinued intact. Site without signs and symptoms of complications. Dressing and pressure applied.  An After Visit Summary was printed and given to the patient.  D/c education completed with patient/family including follow up instructions, medication list, d/c activities limitations if indicated, with other d/c instructions as indicated by MD - patient able to verbalize understanding, all questions fully answered.   Patient instructed to return to ED, call 911, or call MD for any changes in condition.   Patient escorted via WC, and D/C home via  private auto.  Beckey Downing F 11/10/2013 4:56 PM +
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
## 2013-11-10 NOTE — Care Management Note (Unsigned)
    Page 1 of 1   11/10/2013     11:13:49 AM   CARE MANAGEMENT NOTE 11/10/2013  Patient:  [name],[name]   Account Number:  [id number]  Date Initiated:  11/10/2013  Documentation initiated by:  Letha Cape  Subjective/Objective Assessment:   dx gib  admit- lives with family.     Action/Plan:   Anticipated DC Date:  11/10/2013   Anticipated DC Plan:  HOME/SELF CARE      DC Planning Services  CM consult      Choice offered to / List presented to:             Status of service:  In process, will continue to follow Medicare Important Message given?   (If response is "NO", the following Medicare IM given date fields will be blank) Date Medicare IM given:   Date Additional Medicare IM given:    Discharge Disposition:    Per UR Regulation:  Reviewed for med. necessity/level of care/duration of stay  If discussed at Long Length of Stay Meetings, dates discussed:    Comments:  11/10/13 11:10 Letha Cape RN, BSN patient lives with family, patient for EGD today, if negative patient will dc home later this evening no needs anticipated.

## 2013-11-10 NOTE — Op Note (Signed)
[facility] [address] [place], [address]   OPERATIVE PROCEDURE REPORT  PATIENT: [name], [name]  MR#: [id number] BIRTHDATE: 11/10/1971  GENDER: Male ENDOSCOPIST: Jeani Hawking, MD ASSISTANT:   Olene Craven, technician and Dwain Sarna, RN CGRN  PROCEDURE DATE: 11/10/2013 PROCEDURE:   EGD ASA CLASS:   Class III INDICATIONS:Hematemesis MEDICATIONS: Versed 6 mg IV and Fentanyl 50 mcg IV TOPICAL ANESTHETIC:   Cetacaine Spray  DESCRIPTION OF PROCEDURE:   After the risks benefits and alternatives of the procedure were thoroughly explained, informed consent was obtained.  The PENTAX GASTOROSCOPE [id number]  endoscope was introduced through the mouth  and advanced to the second portion of the duodenum Without limitations.      The instrument was slowly withdrawn as the mucosa was fully examined.      FINDINGS: A very mild esophagitis was identified.  There is also the possibility of a 3 cm sliding hiatal hernia.  No evidence of any fresh blood or any trace of recent bleeding.  No evidence of any ulcerations, erosions, tears, or vascualr abnormalities. The scope was then withdrawn from the patient and the procedure terminated.  COMPLICATIONS: There were no complications. IMPRESSION: 1) Minimal esophagitis. 2) Probable sliding hiatal hernia.  RECOMMENDATIONS: 1) PPI PRN. 2) Okay to D/C home.  _______________________________ Rosalie Doctor:  Jeani Hawking, MD 11/10/2013 12:07 PM

## 2013-11-10 NOTE — Progress Notes (Signed)
Patient is in room 5w31. Denies pain, SOB, and chills. Patient educated about call light. Vitals taken. Patient is alert and stable. He is resting. Will continue to monitor

## 2013-11-13 ENCOUNTER — Encounter (HOSPITAL_COMMUNITY): Payer: Self-pay | Admitting: Gastroenterology

## 2013-11-28 DIAGNOSIS — G894 Chronic pain syndrome: Secondary | ICD-10-CM | POA: Diagnosis not present

## 2013-11-28 DIAGNOSIS — M329 Systemic lupus erythematosus, unspecified: Secondary | ICD-10-CM | POA: Diagnosis not present

## 2013-11-28 DIAGNOSIS — M545 Low back pain, unspecified: Secondary | ICD-10-CM | POA: Diagnosis not present

## 2013-11-28 DIAGNOSIS — I7300 Raynaud's syndrome without gangrene: Secondary | ICD-10-CM | POA: Diagnosis not present

## 2013-11-28 DIAGNOSIS — M79609 Pain in unspecified limb: Secondary | ICD-10-CM | POA: Diagnosis not present

## 2013-12-20 ENCOUNTER — Other Ambulatory Visit: Payer: Self-pay | Admitting: Family Medicine

## 2013-12-20 DIAGNOSIS — R7989 Other specified abnormal findings of blood chemistry: Secondary | ICD-10-CM

## 2013-12-20 NOTE — Telephone Encounter (Signed)
Call in #90 Xanax and 5 ml of testosterone with no refills. He needs to have a testosterone level drawn. He also needs drug testing and a contract

## 2013-12-20 NOTE — Telephone Encounter (Signed)
I left a voice message for pt with below information, also put future lab order in computer, pt needs to schedule this appointment. Also sent scripts to pharmacy, they do not have the 5 ml of testosterone, okay per Dr. Clent RidgesFry to order the 10 ml vial.

## 2014-01-01 ENCOUNTER — Encounter: Payer: Self-pay | Admitting: Family Medicine

## 2014-01-01 ENCOUNTER — Ambulatory Visit (INDEPENDENT_AMBULATORY_CARE_PROVIDER_SITE_OTHER): Payer: Medicare Other | Admitting: Family Medicine

## 2014-01-01 VITALS — BP 122/86 | Temp 98.5°F | Wt 187.0 lb

## 2014-01-01 DIAGNOSIS — M329 Systemic lupus erythematosus, unspecified: Secondary | ICD-10-CM

## 2014-01-01 DIAGNOSIS — J329 Chronic sinusitis, unspecified: Secondary | ICD-10-CM | POA: Diagnosis not present

## 2014-01-01 DIAGNOSIS — Z79899 Other long term (current) drug therapy: Secondary | ICD-10-CM | POA: Diagnosis not present

## 2014-01-01 MED ORDER — CEFTRIAXONE SODIUM 1 G IJ SOLR: 1.0000 g | Freq: Once | INTRAMUSCULAR | Status: DC

## 2014-01-01 MED ORDER — MOXIFLOXACIN HCL 400 MG PO TABS: 400.0000 mg | ORAL_TABLET | Freq: Every day | ORAL | Status: DC

## 2014-01-01 MED ORDER — CEFTRIAXONE SODIUM 1 G IJ SOLR
1.0000 g | Freq: Once | INTRAMUSCULAR | Status: AC
  Administered 2014-01-01: 1 g via INTRAMUSCULAR

## 2014-01-01 NOTE — Progress Notes (Signed)
Pre visit review using our clinic review tool, if applicable. No additional management support is needed unless otherwise documented below in the visit note. 
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
## 2014-01-01 NOTE — Addendum Note (Signed)
Addended by: Azucena FreedMILLNER, Teyah Rossy C on: 01/01/2014 03:07 PM   Modules accepted: Orders

## 2014-01-25 ENCOUNTER — Telehealth: Payer: Self-pay | Admitting: Family Medicine

## 2014-01-25 NOTE — Telephone Encounter (Signed)
Pt is needing rx amphetamine-dextroamphetamine (ADDERALL) 30 MG tablet, please call when available for pick up, pt states he is out of meds

## 2014-01-26 MED ORDER — AMPHETAMINE-DEXTROAMPHETAMINE 30 MG PO TABS: 30.0000 mg | ORAL_TABLET | Freq: Three times a day (TID) | ORAL | Status: DC

## 2014-01-26 NOTE — Telephone Encounter (Signed)
done

## 2014-01-26 NOTE — Telephone Encounter (Signed)
Script is ready for pick up and pt is here now.  

## 2014-01-31 ENCOUNTER — Encounter: Payer: Self-pay | Admitting: Family Medicine

## 2014-02-19 ENCOUNTER — Telehealth: Payer: Self-pay | Admitting: Family Medicine

## 2014-02-19 NOTE — Telephone Encounter (Signed)
Pt is requesting to speak with the nurse regarding getting dr. Clent RidgesFry to write a letter to verify that he is ok to travel out of town with his company. Call when its available to pick up.

## 2014-02-19 NOTE — Telephone Encounter (Signed)
Please get more information.

## 2014-02-20 NOTE — Telephone Encounter (Signed)
I spoke with pt and he has a trip planned that is work related, supervisor needs a letter just stating that pt it well enough to travel. ( [place]

## 2014-02-21 NOTE — Telephone Encounter (Signed)
Note is ready for pick up and I left a voice message for pt. 

## 2014-02-21 NOTE — Telephone Encounter (Signed)
The letter is done

## 2014-03-10 ENCOUNTER — Other Ambulatory Visit: Payer: Self-pay | Admitting: Family Medicine

## 2014-03-12 NOTE — Telephone Encounter (Signed)
Per Dr. Clent RidgesFry, okay to refill Prednisone and I did send script e-scribe.

## 2014-03-18 ENCOUNTER — Other Ambulatory Visit: Payer: Self-pay | Admitting: Family Medicine

## 2014-03-19 NOTE — Telephone Encounter (Signed)
Call in #30 with 5 rf 

## 2014-03-29 ENCOUNTER — Other Ambulatory Visit: Payer: Self-pay | Admitting: Family Medicine

## 2014-03-29 NOTE — Telephone Encounter (Signed)
Call in #90 with 5 rf 

## 2014-04-02 DIAGNOSIS — Z79899 Other long term (current) drug therapy: Secondary | ICD-10-CM | POA: Diagnosis not present

## 2014-04-02 DIAGNOSIS — G894 Chronic pain syndrome: Secondary | ICD-10-CM | POA: Diagnosis not present

## 2014-04-02 DIAGNOSIS — M545 Low back pain, unspecified: Secondary | ICD-10-CM | POA: Diagnosis not present

## 2014-04-02 DIAGNOSIS — Z9889 Other specified postprocedural states: Secondary | ICD-10-CM | POA: Diagnosis not present

## 2014-04-02 DIAGNOSIS — Z5181 Encounter for therapeutic drug level monitoring: Secondary | ICD-10-CM | POA: Diagnosis not present

## 2014-04-30 ENCOUNTER — Telehealth: Payer: Self-pay | Admitting: Family Medicine

## 2014-04-30 NOTE — Telephone Encounter (Signed)
Okay to schedule same day and then send this back to Dr. Clent Ridges for medication approval.

## 2014-04-30 NOTE — Telephone Encounter (Signed)
Pt injured his knee over the weekend requesting appt on tomorrow. Ok to use sda? Pt needs new rx for amphetamine-dextroamphetamine (ADDERALL) 30 MG tablet. Please call when available for pick up.

## 2014-04-30 NOTE — Telephone Encounter (Signed)
Pt sch for tomorrow morning

## 2014-05-01 ENCOUNTER — Ambulatory Visit (INDEPENDENT_AMBULATORY_CARE_PROVIDER_SITE_OTHER): Payer: Medicare Other | Admitting: Family Medicine

## 2014-05-01 ENCOUNTER — Encounter: Payer: Self-pay | Admitting: Family Medicine

## 2014-05-01 VITALS — BP 109/63 | HR 100 | Temp 98.5°F | Ht 67.0 in | Wt 182.0 lb

## 2014-05-01 DIAGNOSIS — S8390XA Sprain of unspecified site of unspecified knee, initial encounter: Secondary | ICD-10-CM

## 2014-05-01 DIAGNOSIS — IMO0002 Reserved for concepts with insufficient information to code with codable children: Secondary | ICD-10-CM | POA: Diagnosis not present

## 2014-05-01 MED ORDER — AMPHETAMINE-DEXTROAMPHETAMINE 30 MG PO TABS: 30.0000 mg | ORAL_TABLET | Freq: Three times a day (TID) | ORAL | Status: DC

## 2014-05-01 NOTE — Telephone Encounter (Signed)
done

## 2014-05-01 NOTE — Progress Notes (Signed)
Pre visit review using our clinic review tool, if applicable. No additional management support is needed unless otherwise documented below in the visit note. 

## 2014-05-01 NOTE — Progress Notes (Signed)
   Subjective:    Patient ID: [name], male    DOB: 1971/08/25, 43 y.o.   MRN: [id number]  HPI Here for an injury to the left knee which occurred 3 days ago while playing baseball with his son. He twisted the knee, and he felt and heard a "pop". He had a lot of posterior pain at first but every day this has been getting better. It did not swell. No locking or giving way.    Review of Systems  Constitutional: Negative.   Musculoskeletal: Positive for arthralgias and gait problem. Negative for joint swelling.       Objective:   Physical Exam  Constitutional: He appears well-developed and well-nourished.  Musculoskeletal:  The left knee appears normal with no swelling. He has full flexion but his extension range is a bit limited. Tender in the popliteal space. Anterior drawer and McMurrays are negative.           Assessment & Plan:  Knee sprain, possibly a mild meniscus tear. Use ice and wear a neoprene support sleeve. Recheck prn

## 2014-07-27 ENCOUNTER — Other Ambulatory Visit: Payer: Self-pay | Admitting: Family Medicine

## 2014-07-27 DIAGNOSIS — Z9889 Other specified postprocedural states: Secondary | ICD-10-CM | POA: Diagnosis not present

## 2014-07-27 DIAGNOSIS — G894 Chronic pain syndrome: Secondary | ICD-10-CM | POA: Diagnosis not present

## 2014-07-27 NOTE — Telephone Encounter (Signed)
NO we gave him a 6 month supply on 03-29-14

## 2014-08-01 ENCOUNTER — Telehealth: Payer: Self-pay | Admitting: Family Medicine

## 2014-08-01 NOTE — Telephone Encounter (Signed)
Pt needs new rx generic adderall 30 mg °

## 2014-08-02 MED ORDER — AMPHETAMINE-DEXTROAMPHETAMINE 30 MG PO TABS: 30.0000 mg | ORAL_TABLET | Freq: Three times a day (TID) | ORAL | Status: DC

## 2014-08-02 NOTE — Telephone Encounter (Signed)
Script is ready for pick up and I spoke with pt.  

## 2014-08-02 NOTE — Telephone Encounter (Signed)
done

## 2014-09-25 ENCOUNTER — Other Ambulatory Visit: Payer: Self-pay | Admitting: Family Medicine

## 2014-09-27 NOTE — Telephone Encounter (Signed)
Call in #30 with 5 rf 

## 2014-10-02 ENCOUNTER — Ambulatory Visit (INDEPENDENT_AMBULATORY_CARE_PROVIDER_SITE_OTHER): Payer: Medicare Other | Admitting: Endocrinology

## 2014-10-02 ENCOUNTER — Telehealth: Payer: Self-pay | Admitting: Family Medicine

## 2014-10-02 ENCOUNTER — Encounter: Payer: Self-pay | Admitting: Endocrinology

## 2014-10-02 VITALS — BP 132/89 | HR 130 | Temp 98.7°F | Ht 67.0 in | Wt 188.0 lb

## 2014-10-02 DIAGNOSIS — E291 Testicular hypofunction: Secondary | ICD-10-CM | POA: Diagnosis not present

## 2014-10-02 LAB — FOLLICLE STIMULATING HORMONE: FSH: 1.4 m[IU]/mL (ref 1.4–18.1)

## 2014-10-02 LAB — TESTOSTERONE: Testosterone: 35.42 ng/dL — ABNORMAL LOW (ref 300.00–890.00)

## 2014-10-02 LAB — T4, FREE: Free T4: 1.04 ng/dL (ref 0.60–1.60)

## 2014-10-02 LAB — LUTEINIZING HORMONE: LH: 0.96 m[IU]/mL — ABNORMAL LOW (ref 1.50–9.30)

## 2014-10-02 LAB — TSH: TSH: 2.9 u[IU]/mL (ref 0.35–4.50)

## 2014-10-02 MED ORDER — SILDENAFIL CITRATE 20 MG PO TABS: ORAL_TABLET | ORAL | Status: DC

## 2014-10-02 NOTE — Telephone Encounter (Signed)
Pt request refill testosterone cypionate (DEPOTESTOTERONE CYPIONATE) 200 MG/ML injection Pt saw dr Everardo Allellison today and recommends he get this rx from dr fry. pls advise  [facility]

## 2014-10-02 NOTE — Patient Instructions (Addendum)
It is not safe for you to continue the testosterone injections.  However, we should continue to monitor your blood tests.   blood tests are being requested for you today.  We'll contact you with results.   Here is a prescription for the 20 mg version of viagra.
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
## 2014-10-03 ENCOUNTER — Encounter: Payer: Self-pay | Admitting: Endocrinology

## 2014-10-03 LAB — PROLACTIN: Prolactin: 6.3 ng/mL (ref 2.1–17.1)

## 2014-10-03 NOTE — Telephone Encounter (Signed)
Please call this in to give 0.75 ml every 2 weeks, 6 month supply

## 2014-10-04 MED ORDER — TESTOSTERONE CYPIONATE 200 MG/ML IM SOLN: INTRAMUSCULAR | Status: DC

## 2014-10-04 NOTE — Telephone Encounter (Signed)
I called in script and spoke with pt. 

## 2014-10-21 ENCOUNTER — Other Ambulatory Visit: Payer: Self-pay | Admitting: Family Medicine

## 2014-10-22 NOTE — Telephone Encounter (Signed)
Per Dr. Fry, okay to refill. 

## 2014-10-31 ENCOUNTER — Other Ambulatory Visit: Payer: Self-pay | Admitting: Family Medicine

## 2014-10-31 NOTE — Telephone Encounter (Signed)
Pt needs new rx generic adderall 30 mg °

## 2014-11-01 MED ORDER — AMPHETAMINE-DEXTROAMPHETAMINE 30 MG PO TABS: 30.0000 mg | ORAL_TABLET | Freq: Three times a day (TID) | ORAL | Status: DC

## 2014-11-01 NOTE — Telephone Encounter (Signed)
done

## 2014-11-01 NOTE — Telephone Encounter (Signed)
Pt notified to pick up at the front desk. 

## 2014-12-11 ENCOUNTER — Other Ambulatory Visit: Payer: Self-pay | Admitting: Family Medicine

## 2014-12-12 NOTE — Telephone Encounter (Signed)
Call in #90 with 5 rf 

## 2014-12-18 ENCOUNTER — Other Ambulatory Visit: Payer: Self-pay | Admitting: Family Medicine

## 2014-12-19 NOTE — Telephone Encounter (Signed)
Per Dr. Clent RidgesFry okay to refill Prednisone.

## 2015-01-22 ENCOUNTER — Other Ambulatory Visit: Payer: Self-pay | Admitting: Family Medicine

## 2015-01-23 NOTE — Telephone Encounter (Signed)
This is now managed by Dr. Everardo AllEllison

## 2015-02-04 ENCOUNTER — Telehealth: Payer: Self-pay | Admitting: Family Medicine

## 2015-02-04 MED ORDER — AMPHETAMINE-DEXTROAMPHETAMINE 30 MG PO TABS: 30.0000 mg | ORAL_TABLET | Freq: Three times a day (TID) | ORAL | Status: DC

## 2015-02-04 NOTE — Telephone Encounter (Signed)
He was referred to Dr. Everardo AllEllison for this issue and they discussed this last November. Now that Dr. Everardo AllEllison is treating this issue, he needs to ask him for refills. The patient was informed about this today at our clinic.

## 2015-02-04 NOTE — Telephone Encounter (Signed)
done

## 2015-02-04 NOTE — Telephone Encounter (Signed)
Pt needs new rx generic adderall 30 mg. Pt is out

## 2015-02-05 ENCOUNTER — Telehealth: Payer: Self-pay | Admitting: Family Medicine

## 2015-02-05 NOTE — Telephone Encounter (Signed)
Appt sch for Thursday morning.

## 2015-02-05 NOTE — Telephone Encounter (Signed)
Pt calling to discuss his med management re: his Testerone medication. Would like a call from Dr Clent RidgesFry to discuss why his Isidor Holtsesterone level is 2534 and he was told that is normal. States Dr Everardo AllEllison is trying to take him off the Testerone and has not explained why. He has a intrathecal pump for his pain meds and feels they are well controled.

## 2015-02-05 NOTE — Telephone Encounter (Signed)
Pt needs to schedule a office visit to discuss the below information.

## 2015-02-07 ENCOUNTER — Ambulatory Visit (INDEPENDENT_AMBULATORY_CARE_PROVIDER_SITE_OTHER): Payer: Medicare Other | Admitting: Family Medicine

## 2015-02-07 ENCOUNTER — Encounter: Payer: Self-pay | Admitting: Family Medicine

## 2015-02-07 VITALS — BP 127/79 | HR 117 | Temp 98.9°F | Ht 67.0 in | Wt 185.0 lb

## 2015-02-07 DIAGNOSIS — E291 Testicular hypofunction: Secondary | ICD-10-CM | POA: Diagnosis not present

## 2015-02-07 DIAGNOSIS — Z23 Encounter for immunization: Secondary | ICD-10-CM | POA: Diagnosis not present

## 2015-02-07 MED ORDER — "SYRINGE/NEEDLE (DISP) 23G X 1"" 3 ML MISC": Status: DC

## 2015-02-07 MED ORDER — TESTOSTERONE CYPIONATE 200 MG/ML IM SOLN: INTRAMUSCULAR | Status: DC

## 2015-02-07 NOTE — Progress Notes (Signed)
   Subjective:    Patient ID: [name], male    DOB: 06-30-1971, 44 y.o.   MRN: [id number]  HPI Here to discuss getting back on testosterone replacement. He had been seeing Dr. Everardo AllEllison for this over the past few years, but when he last saw Dr. Everardo AllEllison on 10-02-14 the decision was made to stop the injections. [name] wanted to continue with them but apparently Dr. Everardo AllEllison was not comfortable with prescribing it any longer. His last blood level was extremely low that day at 35 (although he had given his last injection 3 weeks prior to that). Since then [name] has felt tired, weak, has very little interest in sex, and his erections have been unsatisfactory. Otherwise he has been doing well.    Review of Systems  Constitutional: Positive for fatigue. Negative for activity change, appetite change and unexpected weight change.  Respiratory: Negative.   Cardiovascular: Negative.   Neurological: Negative.        Objective:   Physical Exam  Constitutional: He is oriented to person, place, and time. He appears well-developed and well-nourished.  Cardiovascular: Normal rate, regular rhythm, normal heart sounds and intact distal pulses.   Pulmonary/Chest: Effort normal and breath sounds normal.  Neurological: He is alert and oriented to person, place, and time.          Assessment & Plan:  We agreed to get back on testosterone injections. We will keep the dosing at 2 week intervals but we will increase the dose from 0.75 ml to 1.0 ml each time. Recheck a level in 6 months

## 2015-02-07 NOTE — Progress Notes (Signed)
Pre visit review using our clinic review tool, if applicable. No additional management support is needed unless otherwise documented below in the visit note. 

## 2015-04-05 DIAGNOSIS — M329 Systemic lupus erythematosus, unspecified: Secondary | ICD-10-CM | POA: Diagnosis not present

## 2015-04-23 ENCOUNTER — Other Ambulatory Visit: Payer: Self-pay | Admitting: Family Medicine

## 2015-04-25 NOTE — Telephone Encounter (Signed)
Refill for 6 months. 

## 2015-05-16 ENCOUNTER — Telehealth: Payer: Self-pay | Admitting: Family Medicine

## 2015-05-16 NOTE — Telephone Encounter (Signed)
° ° ° ° °  Pt request refill of the following: ° ° °amphetamine-dextroamphetamine (ADDERALL) 30 MG tablet ° ° °Phamacy: °

## 2015-05-20 MED ORDER — AMPHETAMINE-DEXTROAMPHETAMINE 30 MG PO TABS: 30.0000 mg | ORAL_TABLET | Freq: Three times a day (TID) | ORAL | Status: DC

## 2015-05-20 NOTE — Telephone Encounter (Signed)
done

## 2015-05-21 NOTE — Telephone Encounter (Signed)
Pt is aware rx ready for pick up.  

## 2015-07-17 ENCOUNTER — Telehealth: Payer: Self-pay | Admitting: Family Medicine

## 2015-07-17 NOTE — Telephone Encounter (Signed)
Dr. Romilda Joy [phone]) from [employer] called to see if Dr. Clent Ridges can call him back in regard to [name]. He said that he left messages last week and hasn't received a call back from Dr. Clent Ridges.

## 2015-07-18 NOTE — Telephone Encounter (Signed)
Dr. Fry is aware of this message.  

## 2015-07-24 ENCOUNTER — Telehealth: Payer: Self-pay | Admitting: Family Medicine

## 2015-07-24 NOTE — Telephone Encounter (Signed)
Lm on vm for pt to cb. Pt needs appt per dr fry ok to schedule

## 2015-07-25 NOTE — Telephone Encounter (Signed)
Made another attempt to contact pt for an appt. Pt had lm on my vm to see dr fry.

## 2015-08-02 ENCOUNTER — Telehealth: Payer: Self-pay | Admitting: Family Medicine

## 2015-08-02 NOTE — Telephone Encounter (Signed)
FYI

## 2015-08-02 NOTE — Telephone Encounter (Signed)
[employer] states they sent paperwork one question, that dr fry needed to complete on 8/26 have not gotten a response. Concerning pt's long term disability. If dr fry cannot complete this , please let them know. thanks

## 2015-08-02 NOTE — Telephone Encounter (Signed)
Pt never called back and after several attempts and messages left, I will close this note.

## 2015-08-02 NOTE — Telephone Encounter (Signed)
[name] needs to make an OV for [place] to fill this form out, so we will keep it until he comes in

## 2015-08-02 NOTE — Telephone Encounter (Signed)
I spoke with pt and he is aware that he will need a office visit before Dr. Clent Ridges will fill out paperwork.

## 2015-08-13 ENCOUNTER — Ambulatory Visit: Payer: Medicare Other | Admitting: Family Medicine

## 2015-08-13 NOTE — Telephone Encounter (Signed)
Pt was on the schedule to see Dr. Clent Ridges today to discuss filling out paperwork. I left a voice message, only stating that he was on our schedule.

## 2015-08-15 ENCOUNTER — Ambulatory Visit: Payer: Medicare Other | Admitting: Family Medicine

## 2015-08-16 ENCOUNTER — Ambulatory Visit (INDEPENDENT_AMBULATORY_CARE_PROVIDER_SITE_OTHER): Payer: Medicare Other | Admitting: Family Medicine

## 2015-08-16 ENCOUNTER — Encounter: Payer: Self-pay | Admitting: Family Medicine

## 2015-08-16 VITALS — BP 123/74 | HR 104 | Temp 98.7°F | Ht 67.0 in | Wt 182.0 lb

## 2015-08-16 DIAGNOSIS — M545 Low back pain: Secondary | ICD-10-CM

## 2015-08-16 DIAGNOSIS — F900 Attention-deficit hyperactivity disorder, predominantly inattentive type: Secondary | ICD-10-CM

## 2015-08-16 DIAGNOSIS — Z23 Encounter for immunization: Secondary | ICD-10-CM | POA: Diagnosis not present

## 2015-08-16 DIAGNOSIS — I7300 Raynaud's syndrome without gangrene: Secondary | ICD-10-CM

## 2015-08-16 DIAGNOSIS — M3390 Dermatopolymyositis, unspecified, organ involvement unspecified: Secondary | ICD-10-CM | POA: Diagnosis not present

## 2015-08-16 DIAGNOSIS — J8410 Pulmonary fibrosis, unspecified: Secondary | ICD-10-CM

## 2015-08-16 DIAGNOSIS — F411 Generalized anxiety disorder: Secondary | ICD-10-CM | POA: Diagnosis not present

## 2015-08-16 DIAGNOSIS — E230 Hypopituitarism: Secondary | ICD-10-CM

## 2015-08-16 MED ORDER — AMPHETAMINE-DEXTROAMPHETAMINE 30 MG PO TABS: 30.0000 mg | ORAL_TABLET | Freq: Three times a day (TID) | ORAL | Status: DC

## 2015-08-16 MED ORDER — "SYRINGE/NEEDLE (DISP) 23G X 1"" 3 ML MISC": Status: DC

## 2015-08-16 MED ORDER — SILDENAFIL CITRATE 20 MG PO TABS: ORAL_TABLET | ORAL | Status: DC

## 2015-08-16 NOTE — Progress Notes (Signed)
   Subjective:    Patient ID: [name], male    DOB: 03-19-1971, 44 y.o.   MRN: [id number]  HPI Here to discuss his disability status. He has been classified as totally disabled by his disability company [employer], but he recently had an evaluation by an Occupational Medicine specialist hired by them by the name of Dr. Kathlene Cote. Dr. Perlie Gold reviewed some information form [name]' medical records from [place], from his rheumatologist, from his pulmonologist, and from his pain management specialist. He knew that [name] is currently working part time. The final conclusion is that they felt that [name] could work full time with no restrictions. 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 At this level of working, [name] is doing very well and all his medical conditions have tolerated this quite well. His general level of fatigue and his back and joint pains, and his anxiety remain manageable. He has attempted to work more hours than this several times, but each time as he starts to exceed working 30 hours a week his body begins to break down. He develops extreme fatigue and his pain levels rise to 8 or 9 out of 10. Any added levels of stress on his job greatly worsen his chronic anxiety as well.  Each time he had to decrease his hours back to averaging 25 hours a week. He is happy working at this level, and his company is also very happy with his production. All parties would like to continue things just as they are.    Review of Systems  Constitutional: Positive for fatigue.  HENT: Negative.   Eyes: Negative.   Respiratory: Negative.   Cardiovascular: Negative.   Gastrointestinal: Negative.   Genitourinary: Negative.   Musculoskeletal: Positive for back pain and arthralgias.  Neurological: Negative.         Objective:   Physical Exam  Constitutional: He is oriented to person, place, and time. He appears well-developed and well-nourished. No distress.  Cardiovascular: Normal rate, regular rhythm, normal heart sounds and intact distal pulses.   Pulmonary/Chest: Effort normal and breath sounds normal.  Neurological: He is alert and oriented to person, place, and time.          Assessment & Plan:  It does seem that all of [name]'s medical problems are well controlled at the present time. In my opinion he should continue to work in his current capacity and for his current hours, being about 25 hours a week. He is partially disabled in the sense that he cannot work more than 25 hours a week before his lupus, his interstitial lung disease, his Raynauds disease, his chronic back pain, his arthralgias, and his anxiety disorder begin to cause intolerable pain or fatigue.

## 2015-08-16 NOTE — Progress Notes (Signed)
Pre visit review using our clinic review tool, if applicable. No additional management support is needed unless otherwise documented below in the visit note. 

## 2015-08-22 ENCOUNTER — Other Ambulatory Visit: Payer: Self-pay | Admitting: Family Medicine

## 2015-08-23 NOTE — Telephone Encounter (Signed)
Call in #90 with 5 rf 

## 2015-09-01 ENCOUNTER — Other Ambulatory Visit: Payer: Self-pay | Admitting: Family Medicine

## 2015-09-03 NOTE — Telephone Encounter (Signed)
Refill for 6 months. 

## 2015-10-31 ENCOUNTER — Telehealth: Payer: Self-pay | Admitting: Family Medicine

## 2015-10-31 ENCOUNTER — Other Ambulatory Visit: Payer: Self-pay | Admitting: Family Medicine

## 2015-10-31 NOTE — Telephone Encounter (Signed)
° ° ° ° °  Pt request refill of the following: ° ° °amphetamine-dextroamphetamine (ADDERALL) 30 MG tablet ° ° °Phamacy: °

## 2015-11-01 MED ORDER — AMPHETAMINE-DEXTROAMPHETAMINE 30 MG PO TABS: 30.0000 mg | ORAL_TABLET | Freq: Three times a day (TID) | ORAL | Status: DC

## 2015-11-01 NOTE — Telephone Encounter (Signed)
Scripts are ready for pick up and I spoke with pt. 

## 2015-11-01 NOTE — Telephone Encounter (Signed)
done

## 2015-11-06 ENCOUNTER — Other Ambulatory Visit: Payer: Self-pay | Admitting: Family Medicine

## 2015-11-06 NOTE — Telephone Encounter (Signed)
Call in #30 with 5 rf 

## 2015-12-16 ENCOUNTER — Other Ambulatory Visit: Payer: Self-pay | Admitting: Family Medicine

## 2015-12-18 NOTE — Telephone Encounter (Signed)
Call in 10 ml with one rf

## 2016-01-01 ENCOUNTER — Telehealth: Payer: Self-pay | Admitting: Family Medicine

## 2016-01-01 NOTE — Telephone Encounter (Signed)
Pt has moved to [facility] and has lost the last 2 rx adderall. Pt has 11 pills left. Pt needs new rxs

## 2016-01-02 MED ORDER — AMPHETAMINE-DEXTROAMPHETAMINE 30 MG PO TABS: 30.0000 mg | ORAL_TABLET | Freq: Three times a day (TID) | ORAL | Status: DC

## 2016-01-02 NOTE — Telephone Encounter (Signed)
Patient notified Rx ready for pick up. 

## 2016-01-02 NOTE — Telephone Encounter (Signed)
Refilled for one month only.

## 2016-01-08 DIAGNOSIS — Z885 Allergy status to narcotic agent status: Secondary | ICD-10-CM | POA: Diagnosis not present

## 2016-01-08 DIAGNOSIS — M329 Systemic lupus erythematosus, unspecified: Secondary | ICD-10-CM | POA: Diagnosis not present

## 2016-01-08 DIAGNOSIS — Z48811 Encounter for surgical aftercare following surgery on the nervous system: Secondary | ICD-10-CM | POA: Diagnosis not present

## 2016-01-08 DIAGNOSIS — Z882 Allergy status to sulfonamides status: Secondary | ICD-10-CM | POA: Diagnosis not present

## 2016-01-08 DIAGNOSIS — Z888 Allergy status to other drugs, medicaments and biological substances status: Secondary | ICD-10-CM | POA: Diagnosis not present

## 2016-01-08 DIAGNOSIS — G894 Chronic pain syndrome: Secondary | ICD-10-CM | POA: Diagnosis not present

## 2016-01-08 DIAGNOSIS — T85698A Other mechanical complication of other specified internal prosthetic devices, implants and grafts, initial encounter: Secondary | ICD-10-CM | POA: Diagnosis not present

## 2016-01-08 DIAGNOSIS — I7300 Raynaud's syndrome without gangrene: Secondary | ICD-10-CM | POA: Diagnosis not present

## 2016-01-28 ENCOUNTER — Encounter: Payer: Medicare Other | Admitting: Family Medicine

## 2016-02-07 ENCOUNTER — Telehealth: Payer: Self-pay | Admitting: Family Medicine

## 2016-02-07 NOTE — Telephone Encounter (Signed)
° ° ° ° °  Pt request refill of the following: ° ° °amphetamine-dextroamphetamine (ADDERALL) 30 MG tablet ° ° °Phamacy: °

## 2016-02-10 ENCOUNTER — Encounter: Payer: Self-pay | Admitting: Family Medicine

## 2016-02-10 ENCOUNTER — Ambulatory Visit (INDEPENDENT_AMBULATORY_CARE_PROVIDER_SITE_OTHER): Payer: Medicare Other | Admitting: Family Medicine

## 2016-02-10 VITALS — BP 124/80 | Temp 98.6°F | Ht 67.0 in | Wt 182.0 lb

## 2016-02-10 DIAGNOSIS — J029 Acute pharyngitis, unspecified: Secondary | ICD-10-CM

## 2016-02-10 MED ORDER — AMPHETAMINE-DEXTROAMPHETAMINE 30 MG PO TABS: 30.0000 mg | ORAL_TABLET | Freq: Three times a day (TID) | ORAL | Status: DC

## 2016-02-10 MED ORDER — CEPHALEXIN 500 MG PO CAPS
500.0000 mg | ORAL_CAPSULE | Freq: Three times a day (TID) | ORAL | Status: AC
Start: 2016-02-10 — End: 2016-02-20

## 2016-02-10 MED ORDER — FLUCONAZOLE 150 MG PO TABS: 150.0000 mg | ORAL_TABLET | Freq: Once | ORAL | Status: DC

## 2016-02-10 NOTE — Progress Notes (Signed)
Pre visit review using our clinic review tool, if applicable. No additional management support is needed unless otherwise documented below in the visit note. 

## 2016-02-10 NOTE — Telephone Encounter (Signed)
Done during his OV today

## 2016-02-10 NOTE — Progress Notes (Signed)
   Subjective:    Patient ID: [name], male    DOB: 1971/07/22, 45 y.o.   MRN: [id number]  HPI Here for what he thinks is strep throat. His son was diagnosed with strep last week and now [name] has the same symptoms. These started 2 days ago and they included fever, HA, and a very ST. No coughing. Using Advil.    Review of Systems  Constitutional: Positive for fever.  HENT: Positive for sore throat and trouble swallowing. Negative for congestion and postnasal drip.   Eyes: Negative.   Respiratory: Negative.        Objective:   Physical Exam  Constitutional: He appears well-developed and well-nourished.  HENT:  Right Ear: External ear normal.  Left Ear: External ear normal.  Nose: Nose normal.  Mouth/Throat: No oropharyngeal exudate.  Posterior OP red   Eyes: Conjunctivae are normal.  Neck: Neck supple. No thyromegaly present.  Shotty AC nodes   Pulmonary/Chest: Effort normal and breath sounds normal.          Assessment & Plan:  Strep throat, treat with Keflex.

## 2016-02-25 DIAGNOSIS — T814XXA Infection following a procedure, initial encounter: Secondary | ICD-10-CM | POA: Diagnosis not present

## 2016-02-25 DIAGNOSIS — T85738A Infection and inflammatory reaction due to other nervous system device, implant or graft, initial encounter: Secondary | ICD-10-CM | POA: Diagnosis present

## 2016-02-25 DIAGNOSIS — Z882 Allergy status to sulfonamides status: Secondary | ICD-10-CM | POA: Diagnosis not present

## 2016-02-25 DIAGNOSIS — B9561 Methicillin susceptible Staphylococcus aureus infection as the cause of diseases classified elsewhere: Secondary | ICD-10-CM | POA: Diagnosis not present

## 2016-02-25 DIAGNOSIS — Z886 Allergy status to analgesic agent status: Secondary | ICD-10-CM | POA: Diagnosis not present

## 2016-02-25 DIAGNOSIS — G894 Chronic pain syndrome: Secondary | ICD-10-CM | POA: Diagnosis present

## 2016-02-25 DIAGNOSIS — T8131XA Disruption of external operation (surgical) wound, not elsewhere classified, initial encounter: Secondary | ICD-10-CM | POA: Diagnosis not present

## 2016-02-25 DIAGNOSIS — M329 Systemic lupus erythematosus, unspecified: Secondary | ICD-10-CM | POA: Diagnosis present

## 2016-02-25 DIAGNOSIS — I7300 Raynaud's syndrome without gangrene: Secondary | ICD-10-CM | POA: Diagnosis present

## 2016-02-25 DIAGNOSIS — Z452 Encounter for adjustment and management of vascular access device: Secondary | ICD-10-CM | POA: Diagnosis not present

## 2016-02-25 DIAGNOSIS — R52 Pain, unspecified: Secondary | ICD-10-CM

## 2016-02-25 DIAGNOSIS — Z888 Allergy status to other drugs, medicaments and biological substances status: Secondary | ICD-10-CM | POA: Diagnosis not present

## 2016-02-25 HISTORY — DX: Pain, unspecified: R52

## 2016-02-29 DIAGNOSIS — B9561 Methicillin susceptible Staphylococcus aureus infection as the cause of diseases classified elsewhere: Secondary | ICD-10-CM | POA: Diagnosis not present

## 2016-02-29 DIAGNOSIS — Z452 Encounter for adjustment and management of vascular access device: Secondary | ICD-10-CM | POA: Diagnosis not present

## 2016-02-29 DIAGNOSIS — M329 Systemic lupus erythematosus, unspecified: Secondary | ICD-10-CM | POA: Diagnosis not present

## 2016-02-29 DIAGNOSIS — T85738A Infection and inflammatory reaction due to other nervous system device, implant or graft, initial encounter: Secondary | ICD-10-CM | POA: Diagnosis not present

## 2016-02-29 DIAGNOSIS — T8131XA Disruption of external operation (surgical) wound, not elsewhere classified, initial encounter: Secondary | ICD-10-CM | POA: Diagnosis not present

## 2016-02-29 DIAGNOSIS — I7300 Raynaud's syndrome without gangrene: Secondary | ICD-10-CM | POA: Diagnosis not present

## 2016-03-03 DIAGNOSIS — B9561 Methicillin susceptible Staphylococcus aureus infection as the cause of diseases classified elsewhere: Secondary | ICD-10-CM | POA: Diagnosis not present

## 2016-03-03 DIAGNOSIS — E878 Other disorders of electrolyte and fluid balance, not elsewhere classified: Secondary | ICD-10-CM | POA: Diagnosis not present

## 2016-03-03 DIAGNOSIS — I7300 Raynaud's syndrome without gangrene: Secondary | ICD-10-CM | POA: Diagnosis not present

## 2016-03-03 DIAGNOSIS — Z452 Encounter for adjustment and management of vascular access device: Secondary | ICD-10-CM | POA: Diagnosis not present

## 2016-03-03 DIAGNOSIS — T85738A Infection and inflammatory reaction due to other nervous system device, implant or graft, initial encounter: Secondary | ICD-10-CM | POA: Diagnosis not present

## 2016-03-03 DIAGNOSIS — T8131XA Disruption of external operation (surgical) wound, not elsewhere classified, initial encounter: Secondary | ICD-10-CM | POA: Diagnosis not present

## 2016-03-03 DIAGNOSIS — M329 Systemic lupus erythematosus, unspecified: Secondary | ICD-10-CM | POA: Diagnosis not present

## 2016-03-05 DIAGNOSIS — I7300 Raynaud's syndrome without gangrene: Secondary | ICD-10-CM | POA: Diagnosis not present

## 2016-03-05 DIAGNOSIS — Z452 Encounter for adjustment and management of vascular access device: Secondary | ICD-10-CM | POA: Diagnosis not present

## 2016-03-05 DIAGNOSIS — M329 Systemic lupus erythematosus, unspecified: Secondary | ICD-10-CM | POA: Diagnosis not present

## 2016-03-05 DIAGNOSIS — T85738A Infection and inflammatory reaction due to other nervous system device, implant or graft, initial encounter: Secondary | ICD-10-CM | POA: Diagnosis not present

## 2016-03-05 DIAGNOSIS — T8131XA Disruption of external operation (surgical) wound, not elsewhere classified, initial encounter: Secondary | ICD-10-CM | POA: Diagnosis not present

## 2016-03-05 DIAGNOSIS — B9561 Methicillin susceptible Staphylococcus aureus infection as the cause of diseases classified elsewhere: Secondary | ICD-10-CM | POA: Diagnosis not present

## 2016-03-05 DIAGNOSIS — E878 Other disorders of electrolyte and fluid balance, not elsewhere classified: Secondary | ICD-10-CM | POA: Diagnosis not present

## 2016-03-09 DIAGNOSIS — B9561 Methicillin susceptible Staphylococcus aureus infection as the cause of diseases classified elsewhere: Secondary | ICD-10-CM | POA: Diagnosis not present

## 2016-03-09 DIAGNOSIS — T8131XA Disruption of external operation (surgical) wound, not elsewhere classified, initial encounter: Secondary | ICD-10-CM | POA: Diagnosis not present

## 2016-03-09 DIAGNOSIS — M329 Systemic lupus erythematosus, unspecified: Secondary | ICD-10-CM | POA: Diagnosis not present

## 2016-03-09 DIAGNOSIS — E878 Other disorders of electrolyte and fluid balance, not elsewhere classified: Secondary | ICD-10-CM | POA: Diagnosis not present

## 2016-03-09 DIAGNOSIS — Z452 Encounter for adjustment and management of vascular access device: Secondary | ICD-10-CM | POA: Diagnosis not present

## 2016-03-09 DIAGNOSIS — T85738A Infection and inflammatory reaction due to other nervous system device, implant or graft, initial encounter: Secondary | ICD-10-CM | POA: Diagnosis not present

## 2016-03-09 DIAGNOSIS — I7300 Raynaud's syndrome without gangrene: Secondary | ICD-10-CM | POA: Diagnosis not present

## 2016-03-10 ENCOUNTER — Encounter: Payer: Medicare Other | Admitting: Family Medicine

## 2016-03-10 ENCOUNTER — Telehealth: Payer: Self-pay | Admitting: Family Medicine

## 2016-03-10 DIAGNOSIS — Z0289 Encounter for other administrative examinations: Secondary | ICD-10-CM

## 2016-03-10 NOTE — Telephone Encounter (Signed)
Pt was on schedule for a physical today with Dr. Clent RidgesFry. I called and spoke with pt, he forgot about appointment and he will reschedule this.

## 2016-03-12 DIAGNOSIS — Z452 Encounter for adjustment and management of vascular access device: Secondary | ICD-10-CM | POA: Diagnosis not present

## 2016-03-12 DIAGNOSIS — M329 Systemic lupus erythematosus, unspecified: Secondary | ICD-10-CM | POA: Diagnosis not present

## 2016-03-12 DIAGNOSIS — E878 Other disorders of electrolyte and fluid balance, not elsewhere classified: Secondary | ICD-10-CM | POA: Diagnosis not present

## 2016-03-12 DIAGNOSIS — I7300 Raynaud's syndrome without gangrene: Secondary | ICD-10-CM | POA: Diagnosis not present

## 2016-03-12 DIAGNOSIS — B9561 Methicillin susceptible Staphylococcus aureus infection as the cause of diseases classified elsewhere: Secondary | ICD-10-CM | POA: Diagnosis not present

## 2016-03-12 DIAGNOSIS — T85738A Infection and inflammatory reaction due to other nervous system device, implant or graft, initial encounter: Secondary | ICD-10-CM | POA: Diagnosis not present

## 2016-03-12 DIAGNOSIS — T8131XA Disruption of external operation (surgical) wound, not elsewhere classified, initial encounter: Secondary | ICD-10-CM | POA: Diagnosis not present

## 2016-03-13 DIAGNOSIS — B9561 Methicillin susceptible Staphylococcus aureus infection as the cause of diseases classified elsewhere: Secondary | ICD-10-CM | POA: Diagnosis not present

## 2016-03-13 DIAGNOSIS — T8131XA Disruption of external operation (surgical) wound, not elsewhere classified, initial encounter: Secondary | ICD-10-CM | POA: Diagnosis not present

## 2016-03-13 DIAGNOSIS — T85738A Infection and inflammatory reaction due to other nervous system device, implant or graft, initial encounter: Secondary | ICD-10-CM | POA: Diagnosis not present

## 2016-03-13 DIAGNOSIS — I7300 Raynaud's syndrome without gangrene: Secondary | ICD-10-CM | POA: Diagnosis not present

## 2016-03-13 DIAGNOSIS — M329 Systemic lupus erythematosus, unspecified: Secondary | ICD-10-CM | POA: Diagnosis not present

## 2016-03-13 DIAGNOSIS — Z452 Encounter for adjustment and management of vascular access device: Secondary | ICD-10-CM | POA: Diagnosis not present

## 2016-03-26 DIAGNOSIS — G894 Chronic pain syndrome: Secondary | ICD-10-CM | POA: Diagnosis not present

## 2016-03-26 DIAGNOSIS — Z9689 Presence of other specified functional implants: Secondary | ICD-10-CM | POA: Diagnosis not present

## 2016-03-26 DIAGNOSIS — M545 Low back pain: Secondary | ICD-10-CM | POA: Diagnosis not present

## 2016-03-26 DIAGNOSIS — M79601 Pain in right arm: Secondary | ICD-10-CM | POA: Diagnosis not present

## 2016-05-11 ENCOUNTER — Other Ambulatory Visit: Payer: Self-pay | Admitting: Family Medicine

## 2016-05-12 NOTE — Telephone Encounter (Signed)
Call in #30 with 5 rf 

## 2016-07-03 ENCOUNTER — Telehealth: Payer: Self-pay | Admitting: Family Medicine

## 2016-07-03 MED ORDER — AMPHETAMINE-DEXTROAMPHETAMINE 30 MG PO TABS: 30.0000 mg | ORAL_TABLET | Freq: Three times a day (TID) | ORAL | 0 refills | Status: DC

## 2016-07-03 NOTE — Telephone Encounter (Signed)
Pt need new Rx for Adderall and pt state will leaving going out of town on Sunday and would like to have before if possible.  I let pt know that we ask for 3 business days to refill.

## 2016-07-03 NOTE — Telephone Encounter (Signed)
done

## 2016-07-03 NOTE — Telephone Encounter (Signed)
Script is ready for pick up here at front office and I left a voice message for pt with this information.  

## 2016-07-14 ENCOUNTER — Telehealth: Payer: Self-pay

## 2016-07-14 NOTE — Telephone Encounter (Signed)
Prior Authorization approval received for amphetamine-dextroamphetamine (ADDERALL) 30 MG tablet  [facility] provided a temporary supply.  This drug is either not included on our list of covered drugs, or its included on the formulary but subject to certain limits. [facility] is required to provide you with a temporary supply of the drug as follows:  In the outpatient setting, we're required to provide a maximum of 30-day supply of medication. If your prescription is written for fewer days, we'll allow multiple fills to provide up to a maximum 30-day supply of medication.

## 2016-08-12 ENCOUNTER — Other Ambulatory Visit: Payer: Self-pay | Admitting: Family Medicine

## 2016-08-13 ENCOUNTER — Telehealth: Payer: Self-pay

## 2016-08-13 NOTE — Telephone Encounter (Signed)
Refill for 6 months. 

## 2016-08-13 NOTE — Telephone Encounter (Signed)
[facility] PA submitted & is pending. Key: [id number]

## 2016-08-13 NOTE — Telephone Encounter (Signed)
PA approved. Form faxed back to pharmacy. 

## 2016-08-18 ENCOUNTER — Telehealth: Payer: Self-pay

## 2016-08-18 DIAGNOSIS — E291 Testicular hypofunction: Secondary | ICD-10-CM

## 2016-08-18 NOTE — Telephone Encounter (Signed)
Received PA request from [facility]. For testosterone injection. PA submitted & is pending. Key: [id number]

## 2016-08-19 NOTE — Telephone Encounter (Signed)
PA was denied. There are two lab results, but they are from 2015 and 2013. I can resubmit, or do you want patient to come in and have new ones drawn?

## 2016-08-19 NOTE — Telephone Encounter (Signed)
It would be best if he came in and had a new level drawn. The order has been placed

## 2016-08-20 NOTE — Telephone Encounter (Signed)
I left a voice message for pt with below information, he just needs to schedule a lab appointment for testosterone level.

## 2016-08-27 ENCOUNTER — Telehealth: Payer: Self-pay | Admitting: Family Medicine

## 2016-08-27 NOTE — Telephone Encounter (Signed)
Okay to send Rx in? 

## 2016-08-27 NOTE — Telephone Encounter (Signed)
Pt daughter has head lice and dad would like rx sklice. [facility] [place]

## 2016-08-31 MED ORDER — IVERMECTIN 0.5 % EX LOTN: TOPICAL_LOTION | CUTANEOUS | 0 refills | Status: DC

## 2016-08-31 NOTE — Telephone Encounter (Signed)
Call in [place]2 bottles to apply one bottle now and then use the second one 7 days later

## 2016-08-31 NOTE — Telephone Encounter (Signed)
Rx sent to pharmacy   

## 2016-09-02 ENCOUNTER — Telehealth: Payer: Self-pay | Admitting: Family Medicine

## 2016-09-02 ENCOUNTER — Other Ambulatory Visit: Payer: Self-pay | Admitting: Family Medicine

## 2016-09-02 DIAGNOSIS — E349 Endocrine disorder, unspecified: Secondary | ICD-10-CM

## 2016-09-02 NOTE — Telephone Encounter (Signed)
Per Dr. Clent RidgesFry order a testosterone level, pt is due. I put in a future lab order and spoke with pt.

## 2016-09-02 NOTE — Telephone Encounter (Signed)
Can you call pt to schedule below lab?

## 2016-09-03 ENCOUNTER — Telehealth: Payer: Self-pay | Admitting: Family Medicine

## 2016-09-03 NOTE — Telephone Encounter (Signed)
Pt is aware via voicemail that Dr. Clent RidgesFry will be out the office this afternoon and will return Friday AM.

## 2016-09-03 NOTE — Telephone Encounter (Signed)
Called pt and he had several questions concerning the lab appointment so I transferred him to team health pt will call back to schedule when he has received his answer.

## 2016-09-03 NOTE — Telephone Encounter (Signed)
Dr. Clent RidgesFry can you review this message and advise? Last testosterone lab draw was 09/2014.

## 2016-09-03 NOTE — Telephone Encounter (Signed)
Patient Name: Ethan JunglingCHRISTOPHER Swatek  DOB: 1971/08/16    Initial Comment Caller states he's on a pain pump. Testertrone is lower and he needs to take injections.   Nurse Assessment  Nurse: Sherilyn CooterHenry, RN, Thurmond ButtsWade Date/Time Lamount Cohen(Eastern Time): 09/03/2016 9:33:31 AM  Confirm and document reason for call. If symptomatic, describe symptoms. You must click the next button to save text entered. ---Caller states he's on a pain pump. Testertrone is lower and he needs to take injections. Caller states that he was seeing Dr. Everardo AllEllison who began giving him Testosterone injections for years. He states that his new insurance is asking for a low test result in order to continue the injections. He just needs to come in for a lab test. He denies any new or different symptoms.  Has the patient traveled out of the country within the last 30 days? ---No  Does the patient have any new or worsening symptoms? ---No     Guidelines    Guideline Title Affirmed Question Affirmed Notes       Final Disposition User        Comments  Caller states that he is trying to find out how long it takes Testosterone to clear his system so he can have a test done that shows if his levels are low enough to continue the injections. He needs someone to speak with Dr. Clent RidgesFry about this and advise him please.  His last injection was last Thursday.

## 2016-09-07 NOTE — Telephone Encounter (Signed)
His only option is to have another level drawn, and the order is already in the computer. I suggest he have this done at the end of a cycle just before he gives himself another injection (to get as low a value as we can)

## 2016-09-08 ENCOUNTER — Telehealth: Payer: Self-pay | Admitting: Family Medicine

## 2016-09-08 ENCOUNTER — Other Ambulatory Visit (INDEPENDENT_AMBULATORY_CARE_PROVIDER_SITE_OTHER): Payer: Medicare Other

## 2016-09-08 DIAGNOSIS — E349 Endocrine disorder, unspecified: Secondary | ICD-10-CM | POA: Diagnosis not present

## 2016-09-08 NOTE — Telephone Encounter (Signed)
Can you call pt to schedule a lab appointment for testosterone level? It needs to be the latest available appointment in the afternoon.

## 2016-09-08 NOTE — Telephone Encounter (Signed)
Pt has been scheduled and in the future would like to have it done in [place].

## 2016-09-08 NOTE — Telephone Encounter (Signed)
I spoke with pt and went over below information. 

## 2016-09-08 NOTE — Telephone Encounter (Signed)
Pt is scheduled and in the future would like to have it done in [place]

## 2016-09-09 LAB — TESTOSTERONE: Testosterone: 223.59 ng/dL — ABNORMAL LOW (ref 300.00–890.00)

## 2016-09-15 ENCOUNTER — Telehealth: Payer: Self-pay

## 2016-09-15 NOTE — Telephone Encounter (Signed)
Received form from [employer], form filled out & faxed back.

## 2016-09-16 NOTE — Telephone Encounter (Signed)
PA approved. Form faxed back to pharmacy. 

## 2016-09-23 ENCOUNTER — Other Ambulatory Visit: Payer: Self-pay | Admitting: Family Medicine

## 2016-09-24 NOTE — Telephone Encounter (Signed)
Call in #90 with 5 rf 

## 2016-10-22 ENCOUNTER — Ambulatory Visit: Payer: Self-pay | Admitting: Family Medicine

## 2016-10-22 DIAGNOSIS — Z0289 Encounter for other administrative examinations: Secondary | ICD-10-CM

## 2016-10-27 ENCOUNTER — Ambulatory Visit (INDEPENDENT_AMBULATORY_CARE_PROVIDER_SITE_OTHER): Payer: Medicaid Other | Admitting: Family Medicine

## 2016-10-27 ENCOUNTER — Encounter: Payer: Self-pay | Admitting: Family Medicine

## 2016-10-27 VITALS — BP 138/88 | HR 112 | Temp 98.7°F | Ht 67.0 in | Wt 179.0 lb

## 2016-10-27 DIAGNOSIS — N529 Male erectile dysfunction, unspecified: Secondary | ICD-10-CM | POA: Diagnosis not present

## 2016-10-27 DIAGNOSIS — G4733 Obstructive sleep apnea (adult) (pediatric): Secondary | ICD-10-CM | POA: Diagnosis not present

## 2016-10-27 DIAGNOSIS — E291 Testicular hypofunction: Secondary | ICD-10-CM

## 2016-10-27 DIAGNOSIS — Z23 Encounter for immunization: Secondary | ICD-10-CM | POA: Diagnosis not present

## 2016-10-27 HISTORY — DX: Obstructive sleep apnea (adult) (pediatric): G47.33

## 2016-10-27 MED ORDER — ZOLPIDEM TARTRATE 10 MG PO TABS: ORAL_TABLET | ORAL | 5 refills | Status: DC

## 2016-10-27 NOTE — Progress Notes (Signed)
   Subjective:    Patient ID: [name], male    DOB: 25-Oct-1971, 45 y.o.   MRN: [id number]  HPI Here for several issues. First he has complaints about his testosterone medication. He had been doing well with good results while taking a certain generic brand. Then he had to stop due to insurance reasons and he recently resumed treatment. However this time he feels no benefit at all and he has low libido and ED problems. Also he feels his sleep apnea has gotten worse. He was shown to have this over 10 years ago on a sleep study and he had a UPPP procedure done. This did not help much and he has gotten worse. He often wakes up during the night feeling as if he cannot breathe. His wife says he is snoring worse than ever and he stops breathing multiple times a night.    Review of Systems  Constitutional: Negative.   Respiratory: Negative.   Cardiovascular: Negative.   Neurological: Negative.        Objective:   Physical Exam  Constitutional: He is oriented to person, place, and time. He appears well-developed and well-nourished.  HENT:  Nose: Nose normal.  Mouth/Throat: Oropharynx is clear and moist.  Neck: No thyromegaly present.  Cardiovascular: Normal rate, regular rhythm, normal heart sounds and intact distal pulses.   Pulmonary/Chest: Effort normal and breath sounds normal.  Lymphadenopathy:    He has no cervical adenopathy.  Neurological: He is alert and oriented to person, place, and time.          Assessment & Plan:  He has known sleep apnea and would most likely benefit from using a CPAP machine. We will refer him to Pulmonary to evaluate further. His testosterone replacement is not going well so we will refer him to Urology to evaluate.  Nelwyn SalisburyFRY,STEPHEN A, MD

## 2016-10-27 NOTE — Progress Notes (Signed)
Pre visit review using our clinic review tool, if applicable. No additional management support is needed unless otherwise documented below in the visit note. 

## 2016-11-26 ENCOUNTER — Telehealth: Payer: Self-pay | Admitting: Family Medicine

## 2016-11-26 NOTE — Telephone Encounter (Signed)
Pt request refill amphetamine-dextroamphetamine (ADDERALL) 30 MG tablet °3 mo supply °

## 2016-11-27 MED ORDER — AMPHETAMINE-DEXTROAMPHETAMINE 30 MG PO TABS: 30.0000 mg | ORAL_TABLET | Freq: Three times a day (TID) | ORAL | 0 refills | Status: DC

## 2016-11-27 NOTE — Telephone Encounter (Signed)
done

## 2016-11-30 NOTE — Telephone Encounter (Signed)
Script is ready for pick up here at front office and I spoke with pt.  

## 2016-12-08 DIAGNOSIS — E291 Testicular hypofunction: Secondary | ICD-10-CM | POA: Diagnosis not present

## 2016-12-08 DIAGNOSIS — N5201 Erectile dysfunction due to arterial insufficiency: Secondary | ICD-10-CM | POA: Diagnosis not present

## 2016-12-22 ENCOUNTER — Encounter: Payer: Self-pay | Admitting: Pulmonary Disease

## 2016-12-22 ENCOUNTER — Ambulatory Visit (INDEPENDENT_AMBULATORY_CARE_PROVIDER_SITE_OTHER): Payer: Medicaid Other | Admitting: Pulmonary Disease

## 2016-12-22 VITALS — BP 102/76 | HR 112 | Ht 66.5 in | Wt 180.0 lb

## 2016-12-22 DIAGNOSIS — G4733 Obstructive sleep apnea (adult) (pediatric): Secondary | ICD-10-CM | POA: Diagnosis not present

## 2016-12-22 DIAGNOSIS — E291 Testicular hypofunction: Secondary | ICD-10-CM | POA: Diagnosis not present

## 2016-12-22 NOTE — Patient Instructions (Signed)
Will arrange for home sleep study Will call to arrange for follow up after sleep study reviewed  

## 2016-12-22 NOTE — Progress Notes (Signed)
   Subjective:    Patient ID: [name], male    DOB: [birth year], 46 y.o.   MRN: [id number]  HPI    Review of Systems  Constitutional: Negative for fever and unexpected weight change.  HENT: Negative for congestion, dental problem, ear pain, nosebleeds, postnasal drip, rhinorrhea, sinus pressure, sneezing, sore throat and trouble swallowing.   Eyes: Negative for redness and itching.  Respiratory: Negative for cough, chest tightness, shortness of breath and wheezing.   Cardiovascular: Negative for palpitations and leg swelling.  Gastrointestinal: Negative for nausea and vomiting.  Genitourinary: Negative for dysuria.  Musculoskeletal: Negative for joint swelling.  Skin: Negative for rash.  Neurological: Negative for headaches.  Hematological: Does not bruise/bleed easily.  Psychiatric/Behavioral: Negative for dysphoric mood. The patient is not nervous/anxious.        Objective:   Physical Exam        Assessment & Plan:
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
## 2016-12-24 DIAGNOSIS — G894 Chronic pain syndrome: Secondary | ICD-10-CM | POA: Diagnosis not present

## 2016-12-24 DIAGNOSIS — M329 Systemic lupus erythematosus, unspecified: Secondary | ICD-10-CM | POA: Diagnosis not present

## 2016-12-24 DIAGNOSIS — F988 Other specified behavioral and emotional disorders with onset usually occurring in childhood and adolescence: Secondary | ICD-10-CM | POA: Diagnosis not present

## 2016-12-24 DIAGNOSIS — F411 Generalized anxiety disorder: Secondary | ICD-10-CM | POA: Diagnosis not present

## 2016-12-24 DIAGNOSIS — G4700 Insomnia, unspecified: Secondary | ICD-10-CM | POA: Diagnosis not present

## 2016-12-24 DIAGNOSIS — E349 Endocrine disorder, unspecified: Secondary | ICD-10-CM | POA: Diagnosis not present

## 2016-12-29 DIAGNOSIS — N5201 Erectile dysfunction due to arterial insufficiency: Secondary | ICD-10-CM | POA: Diagnosis not present

## 2016-12-29 DIAGNOSIS — E291 Testicular hypofunction: Secondary | ICD-10-CM | POA: Diagnosis not present

## 2016-12-30 DIAGNOSIS — G4733 Obstructive sleep apnea (adult) (pediatric): Secondary | ICD-10-CM | POA: Diagnosis not present

## 2016-12-31 ENCOUNTER — Telehealth: Payer: Self-pay | Admitting: Pulmonary Disease

## 2016-12-31 DIAGNOSIS — G4733 Obstructive sleep apnea (adult) (pediatric): Secondary | ICD-10-CM | POA: Diagnosis not present

## 2016-12-31 NOTE — Telephone Encounter (Signed)
HST 12/30/16 >> AHI 28.4, SaO2 low 49%   Will have my nurse inform pt that sleep study shows moderate to severe sleep apnea.  Options are 1) CPAP now, 2) ROV first.  If He is agreeable to CPAP, then please send order for auto CPAP range 5 to 15 cm H2O with heated humidity and mask of choice.  Have download sent 1 month after starting CPAP and set up ROV 2 months after starting CPAP.  ROV can be with me or NP.

## 2017-01-04 NOTE — Telephone Encounter (Signed)
Left voice message for patient to call office for results

## 2017-01-06 ENCOUNTER — Other Ambulatory Visit: Payer: Self-pay | Admitting: *Deleted

## 2017-01-06 DIAGNOSIS — M5441 Lumbago with sciatica, right side: Secondary | ICD-10-CM | POA: Diagnosis not present

## 2017-01-06 DIAGNOSIS — M79601 Pain in right arm: Secondary | ICD-10-CM | POA: Diagnosis not present

## 2017-01-06 DIAGNOSIS — G4733 Obstructive sleep apnea (adult) (pediatric): Secondary | ICD-10-CM

## 2017-01-06 DIAGNOSIS — I7300 Raynaud's syndrome without gangrene: Secondary | ICD-10-CM | POA: Diagnosis not present

## 2017-01-06 DIAGNOSIS — M5442 Lumbago with sciatica, left side: Secondary | ICD-10-CM | POA: Diagnosis not present

## 2017-01-06 DIAGNOSIS — R110 Nausea: Secondary | ICD-10-CM | POA: Diagnosis not present

## 2017-01-06 DIAGNOSIS — Z9689 Presence of other specified functional implants: Secondary | ICD-10-CM | POA: Diagnosis not present

## 2017-01-06 NOTE — Telephone Encounter (Signed)
Left message for patient to contact office for medical results.

## 2017-01-08 NOTE — Telephone Encounter (Signed)
Patient called back - He can be reached at [phone] -pr

## 2017-01-08 NOTE — Telephone Encounter (Signed)
Spoke with pt and made him aware of results. Pt states during his OV with VS, oral appliance was mentioned. Pt would like to know more about this device. He would like VS's opinion on the oral appliance, and if this would manage his OSA. Pt states he would like to do some research on this device, over the weekend before making a decision.  VS please advise. Thanks.

## 2017-01-12 NOTE — Telephone Encounter (Signed)
He should come in for an ROV to review tx options in more detail.  Okay to double book visit with me.

## 2017-01-12 NOTE — Telephone Encounter (Signed)
Noted  

## 2017-01-12 NOTE — Telephone Encounter (Signed)
Spoke with pt. States that he is still researching the oral appliance. He will call back to make this appointment.

## 2017-01-12 NOTE — Telephone Encounter (Signed)
Will send to Dr Craige CottaSood as Lorain ChildesFYI and close this encounter as patient refused to schedule at this time.

## 2017-01-14 ENCOUNTER — Telehealth: Payer: Self-pay | Admitting: Pulmonary Disease

## 2017-01-14 DIAGNOSIS — G4733 Obstructive sleep apnea (adult) (pediatric): Secondary | ICD-10-CM

## 2017-01-14 NOTE — Telephone Encounter (Signed)
Spoke with pt. He is aware that we will be ordering his CPAP. Order has ben placed per VS. He will call back to make the ROV after he is set up with CPAP.

## 2017-01-14 NOTE — Telephone Encounter (Signed)
Called and spoke with pt and he stated that he did his research and he decided to go with the cpap machine.  pt stated that his insurance may change at the end of the month, so he requested that this be expedited if possible.   VS please advise of order to be sent in for cpap.  Settings, etc.  thanks

## 2017-01-14 NOTE — Telephone Encounter (Signed)
Please arrange for auto CPAP range 5 to 15 cm H2O with heated humidity and mask of choice.  Have ROV set up 2 months after getting CPAP.

## 2017-01-19 ENCOUNTER — Other Ambulatory Visit: Payer: Self-pay | Admitting: Family Medicine

## 2017-04-15 ENCOUNTER — Other Ambulatory Visit: Payer: Self-pay | Admitting: Family Medicine

## 2017-04-16 NOTE — Telephone Encounter (Signed)
No refills. I see he [facility] [place] [facility] [place] He [place] in February.

## 2017-05-03 DIAGNOSIS — Z5181 Encounter for therapeutic drug level monitoring: Secondary | ICD-10-CM | POA: Diagnosis not present

## 2017-05-03 DIAGNOSIS — M5441 Lumbago with sciatica, right side: Secondary | ICD-10-CM | POA: Diagnosis not present

## 2017-05-03 DIAGNOSIS — Z79899 Other long term (current) drug therapy: Secondary | ICD-10-CM | POA: Diagnosis not present

## 2017-05-03 DIAGNOSIS — Z9689 Presence of other specified functional implants: Secondary | ICD-10-CM | POA: Diagnosis not present

## 2017-05-03 DIAGNOSIS — Z0000 Encounter for general adult medical examination without abnormal findings: Secondary | ICD-10-CM | POA: Diagnosis not present

## 2017-05-03 DIAGNOSIS — G894 Chronic pain syndrome: Secondary | ICD-10-CM | POA: Diagnosis not present

## 2017-05-03 DIAGNOSIS — M5442 Lumbago with sciatica, left side: Secondary | ICD-10-CM | POA: Diagnosis not present

## 2017-05-07 ENCOUNTER — Telehealth: Payer: Self-pay | Admitting: Pulmonary Disease

## 2017-05-07 DIAGNOSIS — G4733 Obstructive sleep apnea (adult) (pediatric): Secondary | ICD-10-CM

## 2017-05-07 NOTE — Telephone Encounter (Signed)
Trouble sleeping

## 2017-05-07 NOTE — Telephone Encounter (Signed)
Recommend increase auto pap range to 5-20 until Dr Craige CottaSood available to review.

## 2017-05-07 NOTE — Telephone Encounter (Signed)
Pt having difficulty sleeping with CPAP machine, states that he feels even on the higher pressure he is still not getting enough air.  He also notes having snoring at night. Currently on Auto 5-15cm. Pt is requesting pressure change that might help decrease snoring and help him sleep better at night. DME [facility]  Please advise Dr Maple HudsonYoung if able to give reqs. Pt is not wanting to wait until VS is able to answer. Thanks.

## 2017-05-07 NOTE — Telephone Encounter (Signed)
Per CY : increase pressure to Auto 5-20 range.  Called [facility], spoke with Barbara CowerJason and to see if this could be taken care of ASAP.  Order placed for pressure change. This [facility].  Pt aware, Nothing further needed.  Will send to VS as FYI

## 2017-05-16 ENCOUNTER — Other Ambulatory Visit: Payer: Self-pay | Admitting: Family Medicine

## 2017-05-18 ENCOUNTER — Telehealth: Payer: Self-pay | Admitting: Family Medicine

## 2017-05-18 MED ORDER — ZOLPIDEM TARTRATE 10 MG PO TABS: ORAL_TABLET | ORAL | 5 refills | Status: DC

## 2017-05-18 MED ORDER — ZOLPIDEM TARTRATE 10 MG PO TABS: ORAL_TABLET | ORAL | 0 refills | Status: DC

## 2017-05-18 NOTE — Telephone Encounter (Signed)
Pt was last seen in dec 2017. Pt is going out of town on 05-22-17. Pt would like refill on ambien 10 mg send to [facility] [address]

## 2017-05-18 NOTE — Telephone Encounter (Signed)
Ok to refill for 30 days  

## 2017-05-18 NOTE — Telephone Encounter (Signed)
I called in script to [facility] and left a voice message for pt.

## 2017-06-07 DIAGNOSIS — E291 Testicular hypofunction: Secondary | ICD-10-CM | POA: Diagnosis not present

## 2017-06-07 DIAGNOSIS — N5201 Erectile dysfunction due to arterial insufficiency: Secondary | ICD-10-CM | POA: Diagnosis not present

## 2017-06-21 ENCOUNTER — Other Ambulatory Visit: Payer: Self-pay | Admitting: Adult Health

## 2017-06-22 ENCOUNTER — Encounter: Payer: Self-pay | Admitting: Family Medicine

## 2017-06-22 ENCOUNTER — Ambulatory Visit (INDEPENDENT_AMBULATORY_CARE_PROVIDER_SITE_OTHER): Payer: Medicare Other | Admitting: Family Medicine

## 2017-06-22 VITALS — BP 124/85 | HR 89 | Temp 98.7°F | Ht 66.5 in | Wt 185.0 lb

## 2017-06-22 DIAGNOSIS — R1310 Dysphagia, unspecified: Secondary | ICD-10-CM | POA: Diagnosis not present

## 2017-06-22 DIAGNOSIS — K2100 Gastro-esophageal reflux disease with esophagitis, without bleeding: Secondary | ICD-10-CM

## 2017-06-22 DIAGNOSIS — R1319 Other dysphagia: Secondary | ICD-10-CM

## 2017-06-22 MED ORDER — ZOLPIDEM TARTRATE 10 MG PO TABS: ORAL_TABLET | ORAL | 5 refills | Status: DC

## 2017-06-22 MED ORDER — OMEPRAZOLE 40 MG PO CPDR: 40.0000 mg | DELAYED_RELEASE_CAPSULE | Freq: Every day | ORAL | 5 refills | Status: DC

## 2017-06-22 MED ORDER — ALPRAZOLAM 1 MG PO TABS: 1.0000 mg | ORAL_TABLET | Freq: Three times a day (TID) | ORAL | 5 refills | Status: DC | PRN

## 2017-06-22 NOTE — Progress Notes (Signed)
   Subjective:    Patient ID: [name], male    DOB: [birth year], 46 y.o.   MRN: [id number]  HPI Here for a 4 month hx of frequent heartburn and trouble swallowing. He often feels like food gets stuck halfway down his esophagus and this chokes him. He has vomited on several occasions. He had an EGD in 2015 showing some esophagitis. He used to take a PPI every day but over the past few months he has taken this only sporadically.    Review of Systems  Constitutional: Negative.   HENT: Positive for trouble swallowing.   Respiratory: Negative.   Cardiovascular: Negative.   Gastrointestinal: Negative.        Objective:   Physical Exam  Constitutional: He appears well-developed and well-nourished.  Neck: No thyromegaly present.  Cardiovascular: Normal rate, regular rhythm, normal heart sounds and intact distal pulses.   Pulmonary/Chest: Effort normal and breath sounds normal. No respiratory distress. He has no wheezes. He has no rales.  Abdominal: Soft. Bowel sounds are normal. He exhibits no distension and no mass. There is no tenderness. There is no rebound and no guarding.  Lymphadenopathy:    He has no cervical adenopathy.          Assessment & Plan:  GERD with dysphagia. He likely will need endoscopy with dilation. Ref[facility] to [facility] HowardHung [facility] and get back on Omeprazole daily.  Gershon CraneStephen Fry, MD

## 2017-06-22 NOTE — Patient Instructions (Signed)
WE NOW OFFER   [facility] FAST TRACK!!!  SAME DAY Appointments for ACUTE CARE  Such as: Sprains, Injuries, cuts, abrasions, rashes, muscle pain, joint pain, back pain Colds, flu, sore throats, headache, allergies, cough, fever  Ear pain, sinus and eye infections Abdominal pain, nausea, vomiting, diarrhea, upset stomach Animal/insect bites  3 Easy Ways to Schedule: Walk-In Scheduling Call in scheduling Mychart Sign-up: [email or web address]         

## 2017-06-30 DIAGNOSIS — G894 Chronic pain syndrome: Secondary | ICD-10-CM | POA: Diagnosis not present

## 2017-06-30 DIAGNOSIS — Z79899 Other long term (current) drug therapy: Secondary | ICD-10-CM | POA: Diagnosis not present

## 2017-06-30 DIAGNOSIS — M5442 Lumbago with sciatica, left side: Secondary | ICD-10-CM | POA: Diagnosis not present

## 2017-06-30 DIAGNOSIS — M545 Low back pain: Secondary | ICD-10-CM | POA: Diagnosis not present

## 2017-06-30 DIAGNOSIS — Z9689 Presence of other specified functional implants: Secondary | ICD-10-CM | POA: Diagnosis not present

## 2017-06-30 DIAGNOSIS — Z5181 Encounter for therapeutic drug level monitoring: Secondary | ICD-10-CM | POA: Diagnosis not present

## 2017-08-04 ENCOUNTER — Telehealth: Payer: Self-pay | Admitting: Family Medicine

## 2017-08-04 DIAGNOSIS — R1310 Dysphagia, unspecified: Secondary | ICD-10-CM

## 2017-08-04 NOTE — Telephone Encounter (Signed)
The referral was done  

## 2017-08-04 NOTE — Telephone Encounter (Signed)
DR Elnoria HowardHung will not see pt again and per pt they  did not elaborate the reason. Pt needs to see another GI for dysphagia. Please refer to [facility]

## 2017-08-05 ENCOUNTER — Encounter: Payer: Self-pay | Admitting: Nurse Practitioner

## 2017-08-05 NOTE — Telephone Encounter (Signed)
I spoke with pt and went over below information. 

## 2017-08-19 ENCOUNTER — Encounter: Payer: Self-pay | Admitting: Nurse Practitioner

## 2017-08-19 ENCOUNTER — Encounter (INDEPENDENT_AMBULATORY_CARE_PROVIDER_SITE_OTHER): Payer: Self-pay

## 2017-08-19 ENCOUNTER — Ambulatory Visit (INDEPENDENT_AMBULATORY_CARE_PROVIDER_SITE_OTHER): Payer: Medicare Other | Admitting: Nurse Practitioner

## 2017-08-19 VITALS — BP 114/66 | HR 80 | Ht 66.5 in | Wt 185.4 lb

## 2017-08-19 DIAGNOSIS — R1310 Dysphagia, unspecified: Secondary | ICD-10-CM | POA: Diagnosis not present
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
## 2017-08-19 NOTE — Patient Instructions (Signed)
If you are age 47 or older, your body mass index should be between 23-30. Your Body mass index is 29.48 kg/m. If this is out of the aforementioned range listed, please consider follow up with your Primary Care Provider.  If you are age 86 or younger, your body mass index should be between 19-25. Your Body mass index is 29.48 kg/m. If this is out of the aformentioned range listed, please consider follow up with your Primary Care Provider.   You have been scheduled for an endoscopy. Please follow written instructions given to you at your visit today. If you use inhalers (even only as needed), please bring them with you on the day of your procedure. Your physician has requested that you go to www.startemmi.com and enter the access code given to you at your visit today. This web site gives a general overview about your procedure. However, you should still follow specific instructions given to you by our office regarding your preparation for the procedure.  Thank you for choosing me and [facility].   Willette Cluster, NP

## 2017-08-24 ENCOUNTER — Encounter: Payer: Self-pay | Admitting: Internal Medicine

## 2017-08-24 ENCOUNTER — Ambulatory Visit (AMBULATORY_SURGERY_CENTER): Payer: Medicare Other | Admitting: Internal Medicine

## 2017-08-24 VITALS — BP 117/63 | HR 74 | Temp 99.1°F | Resp 16 | Ht 66.5 in | Wt 185.0 lb

## 2017-08-24 DIAGNOSIS — K2210 Ulcer of esophagus without bleeding: Secondary | ICD-10-CM | POA: Diagnosis not present

## 2017-08-24 DIAGNOSIS — K219 Gastro-esophageal reflux disease without esophagitis: Secondary | ICD-10-CM | POA: Diagnosis not present

## 2017-08-24 DIAGNOSIS — R1310 Dysphagia, unspecified: Secondary | ICD-10-CM

## 2017-08-24 MED ORDER — SODIUM CHLORIDE 0.9 % IV SOLN: 500.0000 mL | INTRAVENOUS | Status: DC

## 2017-08-24 MED ORDER — OMEPRAZOLE 40 MG PO CPDR: 40.0000 mg | DELAYED_RELEASE_CAPSULE | Freq: Two times a day (BID) | ORAL | 5 refills | Status: DC

## 2017-08-24 NOTE — Progress Notes (Signed)
Report given to PACU, vss 

## 2017-08-24 NOTE — Op Note (Signed)
[facility] Patient Name: [name] Procedure Date: 08/24/2017 9:22 AM MRN: [id number] Endoscopist: Beverley Fiedler , MD Age: 46 Referring MD:  Date of Birth: 1970/12/18 Gender: Male Account #: [id number] Procedure:                Upper GI endoscopy Indications:              Dysphagia Medicines:                Monitored Anesthesia Care Procedure:                Pre-Anesthesia Assessment:                           - Prior to the procedure, a History and Physical                            was performed, and patient medications and                            allergies were reviewed. The patient's tolerance of                            previous anesthesia was also reviewed. The risks                            and benefits of the procedure and the sedation                            options and risks were discussed with the patient.                            All questions were answered, and informed consent                            was obtained. Prior Anticoagulants: The patient has                            taken no previous anticoagulant or antiplatelet                            agents. ASA Grade Assessment: III - A patient with                            severe systemic disease. After reviewing the risks                            and benefits, the patient was deemed in                            satisfactory condition to undergo the procedure.                           After obtaining informed consent, the endoscope was  passed under direct vision. Throughout the                            procedure, the patient's blood pressure, pulse, and                            oxygen saturations were monitored continuously. The                            Endoscope was introduced through the mouth, and                            advanced to the second part of duodenum. The upper                            GI endoscopy was accomplished without  difficulty.                            The patient tolerated the procedure well. Scope In: Scope Out: Findings:                 One linear esophageal ulcer with no bleeding was                            found 38 cm from the incisors at the GE junction.                            There was esophagitis in the distal esophagus at GE                            junction, likely reflux related. No obvious                            stricture and dilation was not performed today due                            to the esophagitis and presence of distal                            esophageal ulcer. The ulcer and distal esophagus at                            GE junction was biopsied with a cold forceps for                            histology.                           A small, 1-2 cm, hiatal hernia was present.                           The entire examined stomach was normal.  The examined duodenum was normal. Complications:            No immediate complications. Estimated Blood Loss:     Estimated blood loss was minimal. Impression:               - Non-bleeding esophageal ulcer with esophagitis.                            Biopsied.                           - Small hiatal hernia.                           - Normal stomach.                           - Normal examined duodenum. Recommendation:           - Patient has a contact number available for                            emergencies. The signs and symptoms of potential                            delayed complications were discussed with the                            patient. Return to normal activities tomorrow.                            Written discharge instructions were provided to the                            patient.                           - Resume previous diet.                           - Continue present medications. Increase omeprazole                            to 40 mg twice daily before meals for  8-12 weeks.                            Return to clinic to see me or Willette Cluster, NP in                            about 8 weeks. If persistent dysphagia after                            adequate treatment period with PPI for esophagitis,                            then repeat EGD for dilation.                           -  Await pathology results. Beverley Fiedler, MD 08/24/2017 9:49:24 AM This report has been signed electronically.

## 2017-08-24 NOTE — Progress Notes (Signed)
Called to room to assist during endoscopic procedure.  Patient ID and intended procedure confirmed with present staff. Received instructions for my participation in the procedure from the performing physician.  

## 2017-08-24 NOTE — Progress Notes (Signed)
Pt's states no medical or surgical changes since previsit or office visit. Updated. 

## 2017-08-24 NOTE — Patient Instructions (Signed)
  Hand out given: Esophagitis   Continue Omeprazole ( new prescription sent to your pharmacy), Omeprazole 40 mg twice daily before meals for 8-12 weeks. Return to office to see Willette Cluster NP in about 8 weeks.   YOU HAD AN ENDOSCOPIC PROCEDURE TODAY AT THE [facility]:   Refer to the procedure report that was given to you for any specific questions about what was found during the examination.  If the procedure report does not answer your questions, please call your gastroenterologist to clarify.  If you requested that your care partner not be given the details of your procedure findings, then the procedure report has been included in a sealed envelope for you to review at your convenience later.  YOU SHOULD EXPECT: Some feelings of bloating in the abdomen. Passage of more gas than usual.  Walking can help get rid of the air that was put into your GI tract during the procedure and reduce the bloating. If you had a lower endoscopy (such as a colonoscopy or flexible sigmoidoscopy) you may notice spotting of blood in your stool or on the toilet paper. If you underwent a bowel prep for your procedure, you may not have a normal bowel movement for a few days.  Please Note:  You might notice some irritation and congestion in your nose or some drainage.  This is from the oxygen used during your procedure.  There is no need for concern and it should clear up in a day or so.  SYMPTOMS TO REPORT IMMEDIATELY:    Following upper endoscopy (EGD)  Vomiting of blood or coffee ground material  New chest pain or pain under the shoulder blades  Painful or persistently difficult swallowing  New shortness of breath  Fever of 100F or higher  Black, tarry-looking stools  For urgent or emergent issues, a gastroenterologist can be reached at any hour by calling [phone].   DIET:  We do recommend a small meal at first, but then you may proceed to your regular diet.  Drink plenty of fluids but  you should avoid alcoholic beverages for 24 hours.  ACTIVITY:  You should plan to take it easy for the rest of today and you should NOT DRIVE or use heavy machinery until tomorrow (because of the sedation medicines used during the test).    FOLLOW UP: Our staff will call the number listed on your records the next business day following your procedure to check on you and address any questions or concerns that you may have regarding the information given to you following your procedure. If we do not reach you, we will leave a message.  However, if you are feeling well and you are not experiencing any problems, there is no need to return our call.  We will assume that you have returned to your regular daily activities without incident.  If any biopsies were taken you will be contacted by phone or by letter within the next 1-3 weeks.  Please call us at [phone] if you have not heard about the biopsies in 3 weeks.    SIGNATURES/CONFIDENTIALITY: You and/or your care partner have signed paperwork which will be entered into your electronic medical record.  These signatures attest to the fact that that the information above on your After Visit Summary has been reviewed and is understood.  Full responsibility of the confidentiality of this discharge information lies with you and/or your care-partner.

## 2017-08-25 ENCOUNTER — Telehealth: Payer: Self-pay | Admitting: *Deleted

## 2017-08-25 DIAGNOSIS — Z9689 Presence of other specified functional implants: Secondary | ICD-10-CM | POA: Diagnosis not present

## 2017-08-25 DIAGNOSIS — G894 Chronic pain syndrome: Secondary | ICD-10-CM | POA: Diagnosis not present

## 2017-08-25 DIAGNOSIS — M79601 Pain in right arm: Secondary | ICD-10-CM | POA: Diagnosis not present

## 2017-08-25 DIAGNOSIS — M5442 Lumbago with sciatica, left side: Secondary | ICD-10-CM | POA: Diagnosis not present

## 2017-08-25 NOTE — Telephone Encounter (Signed)
Opened in error

## 2017-08-25 NOTE — Telephone Encounter (Signed)
   Follow up Call-  Call back number 08/24/2017  Post procedure Call Back phone  # [phone]  Permission to leave phone message Yes  Some recent data might be hidden     Patient questions:  Do you have a fever, pain , or abdominal swelling? No. Pain Score  0 *  Have you tolerated food without any problems? Yes.    Have you been able to return to your normal activities? Yes.    Do you have any questions about your discharge instructions: Diet   No. Medications  No. Follow up visit  No.  Do you have questions or concerns about your Care? No.  Actions: * If pain score is 4 or above: No action needed, pain <4.

## 2017-08-30 ENCOUNTER — Telehealth: Payer: Self-pay | Admitting: Family Medicine

## 2017-08-30 MED ORDER — AMPHETAMINE-DEXTROAMPHETAMINE 30 MG PO TABS: 30.0000 mg | ORAL_TABLET | Freq: Three times a day (TID) | ORAL | 0 refills | Status: DC

## 2017-08-30 NOTE — Telephone Encounter (Signed)
Done

## 2017-08-30 NOTE — Telephone Encounter (Signed)
Patient called needing to below prescription refilled. He states he had a prescription but it had expired.   amphetamine-dextroamphetamine (ADDERALL) 30 MG tablet

## 2017-08-31 NOTE — Telephone Encounter (Signed)
Script is ready for pick up here at front office and left a message.  

## 2017-09-02 ENCOUNTER — Encounter: Payer: Self-pay | Admitting: Internal Medicine

## 2017-09-15 ENCOUNTER — Telehealth: Payer: Self-pay | Admitting: Family Medicine

## 2017-09-15 NOTE — Telephone Encounter (Signed)
The prior authorization has been approved through 09/15/2018 as long as the patient remains under the same insurance plan. Approval has been faxed back to the patient's pharmacy.

## 2017-09-15 NOTE — Telephone Encounter (Signed)
Received the prior authorization for Amphetamine-Dextroamphetamine 30 mg tablet. The prior authorization has been submitted to the insurance company via covermymeds. We are currently waiting on a response, please allow 5-7 business days for the insurance to make a decision. Most insurance companies reply sooner than this, but this is the standard. The key to check on the prior authorization is [id number].

## 2017-10-20 DIAGNOSIS — M79601 Pain in right arm: Secondary | ICD-10-CM | POA: Diagnosis not present

## 2017-10-20 DIAGNOSIS — G894 Chronic pain syndrome: Secondary | ICD-10-CM | POA: Diagnosis not present

## 2017-10-20 DIAGNOSIS — Z9689 Presence of other specified functional implants: Secondary | ICD-10-CM | POA: Diagnosis not present

## 2017-10-20 DIAGNOSIS — M5442 Lumbago with sciatica, left side: Secondary | ICD-10-CM | POA: Diagnosis not present

## 2017-10-21 ENCOUNTER — Encounter: Payer: Self-pay | Admitting: *Deleted

## 2017-10-29 ENCOUNTER — Ambulatory Visit: Payer: Medicare Other | Admitting: Internal Medicine

## 2017-11-12 ENCOUNTER — Telehealth: Payer: Self-pay | Admitting: Internal Medicine

## 2017-11-12 NOTE — Telephone Encounter (Signed)
Pt aware.

## 2017-11-12 NOTE — Telephone Encounter (Signed)
Okay to reduce to omeprazole 40 mg once daily; he has a history of ulcerative esophagitis He will need at least once daily PPI and also have him let me know if his GERD/esophagitis symptoms worsen when we decrease the dose We can try another PPI if needed

## 2017-11-12 NOTE — Telephone Encounter (Signed)
Pt feels the omeprazole is causing him to have bloating and diarrhea. He is currently taking omeprazole 40mg  BID. Pt wants to know if he can try a lower dose.

## 2017-11-12 NOTE — Telephone Encounter (Signed)
Patient states medication omeprazole is giving him side affects such as bloating and gas. Pt states the medication works but wants to know if he can get the dosage lowered. Pt states he is currently taking 60 mg twice a day.

## 2017-12-15 DIAGNOSIS — G894 Chronic pain syndrome: Secondary | ICD-10-CM | POA: Diagnosis not present

## 2017-12-15 DIAGNOSIS — I7300 Raynaud's syndrome without gangrene: Secondary | ICD-10-CM | POA: Diagnosis not present

## 2017-12-15 DIAGNOSIS — M329 Systemic lupus erythematosus, unspecified: Secondary | ICD-10-CM | POA: Diagnosis not present

## 2017-12-17 ENCOUNTER — Other Ambulatory Visit: Payer: Self-pay | Admitting: Family Medicine

## 2017-12-17 NOTE — Telephone Encounter (Signed)
Last OV 06/22/2017  Rx was last refilled 06/22/2017 disp 30 with 5 refills   Sent to PCP for approval

## 2017-12-17 NOTE — Telephone Encounter (Signed)
Call in #30 with 5 rf 

## 2017-12-27 ENCOUNTER — Encounter: Payer: Self-pay | Admitting: Family Medicine

## 2017-12-27 ENCOUNTER — Ambulatory Visit (INDEPENDENT_AMBULATORY_CARE_PROVIDER_SITE_OTHER): Payer: Medicare Other | Admitting: Family Medicine

## 2017-12-27 VITALS — BP 110/70 | HR 82 | Temp 98.8°F | Wt 192.7 lb

## 2017-12-27 DIAGNOSIS — L03011 Cellulitis of right finger: Secondary | ICD-10-CM

## 2017-12-27 MED ORDER — CEPHALEXIN 500 MG PO CAPS: 500.0000 mg | ORAL_CAPSULE | Freq: Four times a day (QID) | ORAL | 0 refills | Status: DC

## 2017-12-27 NOTE — Patient Instructions (Signed)
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
## 2018-02-10 DIAGNOSIS — G894 Chronic pain syndrome: Secondary | ICD-10-CM | POA: Diagnosis not present

## 2018-02-10 DIAGNOSIS — Z5181 Encounter for therapeutic drug level monitoring: Secondary | ICD-10-CM | POA: Diagnosis not present

## 2018-02-10 DIAGNOSIS — Z79899 Other long term (current) drug therapy: Secondary | ICD-10-CM | POA: Diagnosis not present

## 2018-02-10 DIAGNOSIS — M5442 Lumbago with sciatica, left side: Secondary | ICD-10-CM | POA: Diagnosis not present

## 2018-02-10 DIAGNOSIS — M5441 Lumbago with sciatica, right side: Secondary | ICD-10-CM | POA: Diagnosis not present

## 2018-02-10 DIAGNOSIS — M545 Low back pain: Secondary | ICD-10-CM | POA: Diagnosis not present

## 2018-02-19 ENCOUNTER — Other Ambulatory Visit: Payer: Self-pay | Admitting: Family Medicine

## 2018-02-22 NOTE — Telephone Encounter (Signed)
Last OV 12/27/2017   Last OV 01/20/2017 disp 1 with 11 refills   Sent to PCP for approval

## 2018-04-07 DIAGNOSIS — G8929 Other chronic pain: Secondary | ICD-10-CM | POA: Diagnosis not present

## 2018-04-07 DIAGNOSIS — M5441 Lumbago with sciatica, right side: Secondary | ICD-10-CM | POA: Diagnosis not present

## 2018-04-07 DIAGNOSIS — M5442 Lumbago with sciatica, left side: Secondary | ICD-10-CM | POA: Diagnosis not present

## 2018-04-07 DIAGNOSIS — G894 Chronic pain syndrome: Secondary | ICD-10-CM | POA: Diagnosis not present

## 2018-04-19 ENCOUNTER — Other Ambulatory Visit: Payer: Self-pay | Admitting: Family Medicine

## 2018-04-19 ENCOUNTER — Telehealth: Payer: Self-pay | Admitting: Family Medicine

## 2018-04-19 NOTE — Telephone Encounter (Signed)
Copied from CRM [id number]. Topic: Quick Communication - Rx Refill/Question >> Apr 19, 2018 11:06 AM Gerrianne Scale wrote: Medication:    amphetamine-dextroamphetamine (ADDERALL) 30 MG table  RX has expired pt only take 1 tab a day now so need 30 pills  Has the patient contacted their pharmacy? Yes.   (Agent: If no, request that the patient contact the pharmacy for the refill.) (Agent: If yes, when and what did the pharmacy advise?)  Preferred Pharmacy (with phone number or street name):     [id number] - [place], [place] - [address] AT [facility] [address] [phone] (Phone) [phone] (Fax)      Agent: Please be advised that RX refills may take up to 3 business days. We ask that you follow-up with your pharmacy.

## 2018-04-19 NOTE — Telephone Encounter (Signed)
Last OV 12/27/2017   Last refilled 08/30/2017 disp 90 with no refills  Sent to PCP for approval

## 2018-04-19 NOTE — Telephone Encounter (Signed)
See refill encounter from 5/28

## 2018-04-21 ENCOUNTER — Telehealth: Payer: Self-pay | Admitting: Family Medicine

## 2018-04-21 MED ORDER — AMPHETAMINE-DEXTROAMPHETAMINE 30 MG PO TABS
30.0000 mg | ORAL_TABLET | Freq: Three times a day (TID) | ORAL | 0 refills | Status: DC
Start: 2018-04-21 — End: 2018-04-21

## 2018-04-21 MED ORDER — AMPHETAMINE-DEXTROAMPHETAMINE 30 MG PO TABS: 30.0000 mg | ORAL_TABLET | Freq: Three times a day (TID) | ORAL | 0 refills | Status: DC

## 2018-04-21 NOTE — Telephone Encounter (Signed)
These were sent in  

## 2018-04-21 NOTE — Telephone Encounter (Signed)
Copied from CRM [id number]. Topic: Quick Communication - Rx Refill/Question >> Apr 19, 2018 11:06 AM Gerrianne Scale wrote: Medication:    amphetamine-dextroamphetamine (ADDERALL) 30 MG table  RX has expired pt only take 1 tab a day now so need 30 pills  Has the patient contacted their pharmacy? Yes.   (Agent: If no, request that the patient contact the pharmacy for the refill.) (Agent: If yes, when and what did the pharmacy advise?)  Preferred Pharmacy (with phone number or street name):     [id number] - [place], [place] - [address] AT [facility] OF [address] [phone] (Phone) [phone] (Fax)      Agent: Please be advised that RX refills may take up to 3 business days. We ask that you follow-up with your pharmacy. >> Apr 21, 2018 10:50 AM Rudi Coco, NT wrote: Pt. Calling to check on refill status on med. From 04/19/18

## 2018-04-21 NOTE — Telephone Encounter (Signed)
Last OV 12/27/2017   Last refilled 08/30/2017 disp 90 with no refills    Sent to PCP to advise

## 2018-04-21 NOTE — Telephone Encounter (Signed)
Called and spoke with pt. Pt advised and voiced understanding.  

## 2018-04-27 DIAGNOSIS — I8312 Varicose veins of left lower extremity with inflammation: Secondary | ICD-10-CM | POA: Diagnosis not present

## 2018-04-27 DIAGNOSIS — R600 Localized edema: Secondary | ICD-10-CM | POA: Diagnosis not present

## 2018-04-27 DIAGNOSIS — I8311 Varicose veins of right lower extremity with inflammation: Secondary | ICD-10-CM | POA: Diagnosis not present

## 2018-05-12 ENCOUNTER — Telehealth: Payer: Self-pay | Admitting: Family Medicine

## 2018-05-12 DIAGNOSIS — R21 Rash and other nonspecific skin eruption: Secondary | ICD-10-CM | POA: Diagnosis not present

## 2018-05-12 DIAGNOSIS — L509 Urticaria, unspecified: Secondary | ICD-10-CM | POA: Diagnosis not present

## 2018-05-12 NOTE — Telephone Encounter (Signed)
Sent to PCP to advise 

## 2018-05-12 NOTE — Telephone Encounter (Signed)
Copied from CRM [id number]. Topic: Quick Communication - See Telephone Encounter >> May 12, 2018  1:47 PM Arlyss Gandyichardson, Maxemiliano Riel N, NT wrote: CRM for notification. See Telephone encounter for: 05/12/18. Pt calling and states he believes his girlfriend contracted scabies from trying on a pair of scrub pants that a girl was selling on Facebook. He states he now has a rash under his arms, around his belt line on his abdomen and any area on his body where any crease may be. He states the itching is awful for him and his girlfriend. HE is wanting to see if something can be called in or if he should come in for an appointment?  [facility] - [place], [place] - [phone] (Phone) [phone] (Fax)

## 2018-05-13 MED ORDER — PERMETHRIN 5 % EX CREA: TOPICAL_CREAM | CUTANEOUS | 2 refills | Status: DC

## 2018-05-13 NOTE — Telephone Encounter (Signed)
Medication has been sent.   Called and spoke with pt. Pt stated that he was itching so badly yesterday that he went to an urgent care they did NOT feel that he had scabies but did suggest for him to follow up with a dermatologist doctor. Pt stated that he would still like this medication sent in just in case if he does NOT need it he won't refill.

## 2018-05-13 NOTE — Telephone Encounter (Signed)
Call in Permethrin 5% to apply over the body, leave on overnight, and wash off the next morning. 60 gram tube with 2 rf

## 2018-05-16 ENCOUNTER — Encounter: Payer: Self-pay | Admitting: Family Medicine

## 2018-05-18 NOTE — Telephone Encounter (Signed)
I do not understand what he is talking about. On 04-21-18 I sent in 3 different prescriptions like I always do, each for 30 days apiece. One to fill on 5-30, one on 6-30, and one on 7-30. There was nothing about a 90 day prescription. He take this 3 times daily

## 2018-05-19 NOTE — Telephone Encounter (Signed)
Noted  

## 2018-06-07 DIAGNOSIS — M545 Low back pain: Secondary | ICD-10-CM | POA: Diagnosis not present

## 2018-06-07 DIAGNOSIS — I8311 Varicose veins of right lower extremity with inflammation: Secondary | ICD-10-CM | POA: Diagnosis not present

## 2018-06-07 DIAGNOSIS — R600 Localized edema: Secondary | ICD-10-CM | POA: Diagnosis not present

## 2018-06-07 DIAGNOSIS — M5442 Lumbago with sciatica, left side: Secondary | ICD-10-CM | POA: Diagnosis not present

## 2018-06-07 DIAGNOSIS — I8312 Varicose veins of left lower extremity with inflammation: Secondary | ICD-10-CM | POA: Diagnosis not present

## 2018-06-07 DIAGNOSIS — I7300 Raynaud's syndrome without gangrene: Secondary | ICD-10-CM | POA: Diagnosis not present

## 2018-06-07 DIAGNOSIS — G894 Chronic pain syndrome: Secondary | ICD-10-CM | POA: Diagnosis not present

## 2018-06-24 ENCOUNTER — Other Ambulatory Visit: Payer: Self-pay | Admitting: Family Medicine

## 2018-06-24 NOTE — Telephone Encounter (Signed)
Ambien refill request  Unable to locate an OV where this is addressed.   Last refill:  12/21/17  #30 with refills:  5  PCP:  Dr. Fransisca ConnorsFry  [facility] - [place], [place]

## 2018-06-24 NOTE — Telephone Encounter (Signed)
Copied from CRM [id number]. Topic: Quick Communication - Rx Refill/Question >> Jun 24, 2018  2:24 PM Burchel, Abbi R wrote: Medication: zolpidem (AMBIEN) 10 MG tablet  [facility] [place], [place] - [phone] [place] [address] Phone: [phone] Fax: [phone]  Pharmacy Requested Rx.

## 2018-06-27 NOTE — Telephone Encounter (Signed)
Sent to PCP for approval.  

## 2018-06-28 MED ORDER — ZOLPIDEM TARTRATE 10 MG PO TABS: ORAL_TABLET | ORAL | 5 refills | Status: DC

## 2018-06-28 NOTE — Telephone Encounter (Signed)
Call in #30 with 5 rf 

## 2018-06-28 NOTE — Telephone Encounter (Signed)
Rx has been faxed in.  

## 2018-07-06 DIAGNOSIS — I8312 Varicose veins of left lower extremity with inflammation: Secondary | ICD-10-CM | POA: Diagnosis not present

## 2018-07-06 DIAGNOSIS — R600 Localized edema: Secondary | ICD-10-CM | POA: Diagnosis not present

## 2018-07-06 DIAGNOSIS — I8311 Varicose veins of right lower extremity with inflammation: Secondary | ICD-10-CM | POA: Diagnosis not present

## 2018-07-13 ENCOUNTER — Other Ambulatory Visit: Payer: Self-pay | Admitting: Internal Medicine

## 2018-07-13 DIAGNOSIS — K2210 Ulcer of esophagus without bleeding: Secondary | ICD-10-CM

## 2018-07-13 DIAGNOSIS — R1310 Dysphagia, unspecified: Secondary | ICD-10-CM

## 2018-07-27 ENCOUNTER — Encounter: Payer: Self-pay | Admitting: Family Medicine

## 2018-07-28 MED ORDER — TOBRAMYCIN-DEXAMETHASONE 0.3-0.1 % OP SUSP: OPHTHALMIC | 0 refills | Status: DC

## 2018-07-28 NOTE — Telephone Encounter (Signed)
Call in Tobradex drops to use 2 drops every 4 hours prn, 10 ml with no rf

## 2018-08-02 ENCOUNTER — Other Ambulatory Visit: Payer: Self-pay

## 2018-08-02 MED ORDER — TOBRAMYCIN 0.3 % OP SOLN: OPHTHALMIC | 0 refills | Status: DC

## 2018-08-02 NOTE — Telephone Encounter (Signed)
Cancel the Tobradex drops. Instead call in Tobramycin 0.3% eye drops to apply 2 drops every 4 hours prn, 5 ml   Rx has been sent

## 2018-08-02 NOTE — Telephone Encounter (Signed)
Cancel the Tobradex drops. Instead call in Tobramycin 0.3% eye drops to apply 2 drops every 4 hours prn, 5 ml

## 2018-09-07 DIAGNOSIS — I7300 Raynaud's syndrome without gangrene: Secondary | ICD-10-CM | POA: Diagnosis not present

## 2018-09-07 DIAGNOSIS — M79642 Pain in left hand: Secondary | ICD-10-CM | POA: Diagnosis not present

## 2018-09-30 DIAGNOSIS — G894 Chronic pain syndrome: Secondary | ICD-10-CM | POA: Diagnosis not present

## 2018-09-30 DIAGNOSIS — M5442 Lumbago with sciatica, left side: Secondary | ICD-10-CM | POA: Diagnosis not present

## 2018-09-30 DIAGNOSIS — Z5181 Encounter for therapeutic drug level monitoring: Secondary | ICD-10-CM | POA: Diagnosis not present

## 2018-09-30 DIAGNOSIS — L600 Ingrowing nail: Secondary | ICD-10-CM | POA: Diagnosis not present

## 2018-09-30 DIAGNOSIS — Z978 Presence of other specified devices: Secondary | ICD-10-CM | POA: Diagnosis not present

## 2018-09-30 DIAGNOSIS — Z79899 Other long term (current) drug therapy: Secondary | ICD-10-CM | POA: Diagnosis not present

## 2018-09-30 DIAGNOSIS — M79601 Pain in right arm: Secondary | ICD-10-CM | POA: Diagnosis not present

## 2018-10-17 DIAGNOSIS — I868 Varicose veins of other specified sites: Secondary | ICD-10-CM | POA: Diagnosis not present

## 2018-10-17 DIAGNOSIS — Z86711 Personal history of pulmonary embolism: Secondary | ICD-10-CM | POA: Diagnosis not present

## 2018-10-18 DIAGNOSIS — Z86711 Personal history of pulmonary embolism: Secondary | ICD-10-CM

## 2018-10-18 HISTORY — DX: Personal history of pulmonary embolism: Z86.711

## 2018-10-25 DIAGNOSIS — E291 Testicular hypofunction: Secondary | ICD-10-CM | POA: Diagnosis not present

## 2018-11-02 DIAGNOSIS — I878 Other specified disorders of veins: Secondary | ICD-10-CM | POA: Diagnosis not present

## 2018-11-02 DIAGNOSIS — M329 Systemic lupus erythematosus, unspecified: Secondary | ICD-10-CM | POA: Diagnosis not present

## 2018-11-02 DIAGNOSIS — Z86711 Personal history of pulmonary embolism: Secondary | ICD-10-CM | POA: Diagnosis not present

## 2018-11-10 DIAGNOSIS — L608 Other nail disorders: Secondary | ICD-10-CM | POA: Diagnosis not present

## 2018-11-30 DIAGNOSIS — Z79899 Other long term (current) drug therapy: Secondary | ICD-10-CM | POA: Diagnosis not present

## 2018-11-30 DIAGNOSIS — Z5181 Encounter for therapeutic drug level monitoring: Secondary | ICD-10-CM | POA: Diagnosis not present

## 2018-12-05 ENCOUNTER — Other Ambulatory Visit: Payer: Self-pay | Admitting: Family Medicine

## 2018-12-05 NOTE — Telephone Encounter (Unsigned)
Copied from CRM [id number]. Topic: Quick Communication - Rx Refill/Question >> Dec 05, 2018 11:33 AM Windy Kalata, NT wrote: Medication:amphetamine-dextroamphetamine (ADDERALL) 30 MG tablet  Has the patient contacted their pharmacy? Yes.  Was instructed to contact office.   Preferred Pharmacy (with phone number or street name): [id number] - [place], [place] - [address] AT [facility] [address] [place] [address] Phone: [phone] Fax: [phone]    Agent: Please be advised that RX refills may take up to 3 business days. We ask that you follow-up with your pharmacy.

## 2018-12-08 NOTE — Telephone Encounter (Signed)
Dr. Fry please advise on refill of medication.  Thanks  

## 2018-12-09 ENCOUNTER — Encounter: Payer: Self-pay | Admitting: Family Medicine

## 2018-12-09 MED ORDER — AMPHETAMINE-DEXTROAMPHETAMINE 30 MG PO TABS: 30.0000 mg | ORAL_TABLET | Freq: Three times a day (TID) | ORAL | 0 refills | Status: DC

## 2018-12-09 MED ORDER — AMPHETAMINE-DEXTROAMPHETAMINE 30 MG PO TABS
30.0000 mg | ORAL_TABLET | Freq: Three times a day (TID) | ORAL | 0 refills | Status: DC
Start: 2018-12-09 — End: 2018-12-09

## 2018-12-09 NOTE — Telephone Encounter (Signed)
Done

## 2018-12-09 NOTE — Telephone Encounter (Signed)
Patient is calling to status of his medication refill. Please advise.

## 2018-12-26 ENCOUNTER — Other Ambulatory Visit: Payer: Self-pay | Admitting: Internal Medicine

## 2018-12-26 DIAGNOSIS — R1310 Dysphagia, unspecified: Secondary | ICD-10-CM

## 2018-12-26 DIAGNOSIS — K2210 Ulcer of esophagus without bleeding: Secondary | ICD-10-CM

## 2019-01-02 ENCOUNTER — Other Ambulatory Visit: Payer: Self-pay | Admitting: Internal Medicine

## 2019-01-02 DIAGNOSIS — R1310 Dysphagia, unspecified: Secondary | ICD-10-CM

## 2019-01-02 DIAGNOSIS — K2210 Ulcer of esophagus without bleeding: Secondary | ICD-10-CM

## 2019-01-03 ENCOUNTER — Telehealth: Payer: Self-pay | Admitting: Internal Medicine

## 2019-01-03 DIAGNOSIS — K2210 Ulcer of esophagus without bleeding: Secondary | ICD-10-CM

## 2019-01-03 DIAGNOSIS — R1310 Dysphagia, unspecified: Secondary | ICD-10-CM

## 2019-01-03 MED ORDER — OMEPRAZOLE 40 MG PO CPDR: DELAYED_RELEASE_CAPSULE | ORAL | 1 refills | Status: DC

## 2019-01-03 NOTE — Telephone Encounter (Signed)
Patient indicates that he has been taking his omeprazole twice daily again because he realized he was drinking a tea that was causing his symptoms of bloating etc; it was not the medication. Therefore, he requests bid dosing again. I have advised that he needs a follow up office visit for refills. He has scheduled an appointment and I will send him enough meds until that appointment.

## 2019-01-20 ENCOUNTER — Other Ambulatory Visit: Payer: Self-pay

## 2019-01-20 DIAGNOSIS — R109 Unspecified abdominal pain: Secondary | ICD-10-CM

## 2019-01-20 NOTE — Telephone Encounter (Signed)
I have read last 2 patient messages  Would recommend CBC, CMP, lipase APP visit next week if available ER over the weekend if not improving No NSAIDs Would continue the omeprazole 40 mg BID-AC until he is seen

## 2019-01-23 ENCOUNTER — Other Ambulatory Visit: Payer: Self-pay | Admitting: Family Medicine

## 2019-01-23 ENCOUNTER — Encounter: Payer: Self-pay | Admitting: Family Medicine

## 2019-01-23 NOTE — Telephone Encounter (Signed)
Call in Zolpidem #30 with no refills. He needs an OV soon

## 2019-01-23 NOTE — Telephone Encounter (Signed)
Dr. Clent Ridges please advise on refill of generic ambien.

## 2019-01-24 DIAGNOSIS — M542 Cervicalgia: Secondary | ICD-10-CM | POA: Diagnosis not present

## 2019-01-24 DIAGNOSIS — M5440 Lumbago with sciatica, unspecified side: Secondary | ICD-10-CM | POA: Diagnosis not present

## 2019-01-24 DIAGNOSIS — G894 Chronic pain syndrome: Secondary | ICD-10-CM | POA: Diagnosis not present

## 2019-01-25 NOTE — Telephone Encounter (Signed)
Dr. Fry please advise on refill of med   Thanks  

## 2019-01-31 DIAGNOSIS — I8311 Varicose veins of right lower extremity with inflammation: Secondary | ICD-10-CM | POA: Diagnosis not present

## 2019-02-02 ENCOUNTER — Encounter: Payer: Self-pay | Admitting: Family Medicine

## 2019-02-02 MED ORDER — ZOLPIDEM TARTRATE 10 MG PO TABS: ORAL_TABLET | ORAL | 5 refills | Status: DC

## 2019-02-02 NOTE — Telephone Encounter (Signed)
Call in Ambien #30 with 5 rf

## 2019-02-02 NOTE — Telephone Encounter (Signed)
Dr. Clent Ridges please advise on refill of the ambien.  Thanks

## 2019-02-06 DIAGNOSIS — I8311 Varicose veins of right lower extremity with inflammation: Secondary | ICD-10-CM | POA: Diagnosis not present

## 2019-02-10 DIAGNOSIS — I8312 Varicose veins of left lower extremity with inflammation: Secondary | ICD-10-CM | POA: Diagnosis not present

## 2019-02-14 ENCOUNTER — Telehealth (INDEPENDENT_AMBULATORY_CARE_PROVIDER_SITE_OTHER): Payer: Medicare Other | Admitting: Internal Medicine

## 2019-02-14 ENCOUNTER — Other Ambulatory Visit: Payer: Self-pay

## 2019-02-14 DIAGNOSIS — K2210 Ulcer of esophagus without bleeding: Secondary | ICD-10-CM

## 2019-02-14 DIAGNOSIS — K449 Diaphragmatic hernia without obstruction or gangrene: Secondary | ICD-10-CM

## 2019-02-14 DIAGNOSIS — K2100 Gastro-esophageal reflux disease with esophagitis, without bleeding: Secondary | ICD-10-CM

## 2019-02-14 DIAGNOSIS — R1310 Dysphagia, unspecified: Secondary | ICD-10-CM

## 2019-02-14 MED ORDER — OMEPRAZOLE 40 MG PO CPDR: DELAYED_RELEASE_CAPSULE | ORAL | 1 refills | Status: DC

## 2019-02-14 NOTE — Patient Instructions (Addendum)
We will contact you to schedule an appointment with Dr Doristine Locks to discuss possible T.I.F. procedure for your reflux.   You will be due for a recall colonoscopy in 07/2020. We will send you a reminder in the mail when it gets closer to that time.  We have sent the following medications to your pharmacy for you to pick up at your convenience: Omeprazole 40 mg twice daily  It was a pleasure to speak with you today!  Dr. Rhea Belton
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
## 2019-02-15 DIAGNOSIS — I8311 Varicose veins of right lower extremity with inflammation: Secondary | ICD-10-CM | POA: Diagnosis not present

## 2019-02-17 DIAGNOSIS — I8311 Varicose veins of right lower extremity with inflammation: Secondary | ICD-10-CM | POA: Diagnosis not present

## 2019-02-17 DIAGNOSIS — I8312 Varicose veins of left lower extremity with inflammation: Secondary | ICD-10-CM | POA: Diagnosis not present

## 2019-03-16 DIAGNOSIS — Z3189 Encounter for other procreative management: Secondary | ICD-10-CM | POA: Diagnosis not present

## 2019-03-20 ENCOUNTER — Other Ambulatory Visit: Payer: Self-pay | Admitting: Family Medicine

## 2019-03-21 ENCOUNTER — Encounter: Payer: Self-pay | Admitting: Family Medicine

## 2019-03-22 NOTE — Telephone Encounter (Signed)
Refill has been sent to the pharmacy.  

## 2019-03-24 DIAGNOSIS — M542 Cervicalgia: Secondary | ICD-10-CM | POA: Diagnosis not present

## 2019-03-24 DIAGNOSIS — G894 Chronic pain syndrome: Secondary | ICD-10-CM | POA: Diagnosis not present

## 2019-03-24 DIAGNOSIS — I7300 Raynaud's syndrome without gangrene: Secondary | ICD-10-CM | POA: Diagnosis not present

## 2019-03-24 DIAGNOSIS — M5440 Lumbago with sciatica, unspecified side: Secondary | ICD-10-CM | POA: Diagnosis not present

## 2019-03-24 DIAGNOSIS — Z978 Presence of other specified devices: Secondary | ICD-10-CM | POA: Diagnosis not present

## 2019-03-28 DIAGNOSIS — I8311 Varicose veins of right lower extremity with inflammation: Secondary | ICD-10-CM | POA: Diagnosis not present

## 2019-04-05 ENCOUNTER — Other Ambulatory Visit: Payer: Self-pay | Admitting: Family Medicine

## 2019-04-05 DIAGNOSIS — I8312 Varicose veins of left lower extremity with inflammation: Secondary | ICD-10-CM | POA: Diagnosis not present

## 2019-04-11 ENCOUNTER — Encounter: Payer: Self-pay | Admitting: Family Medicine

## 2019-04-11 NOTE — Telephone Encounter (Signed)
Cory please advise if you are willing to do rx for the covid testing for this pt.  Thanks

## 2019-04-12 ENCOUNTER — Telehealth (INDEPENDENT_AMBULATORY_CARE_PROVIDER_SITE_OTHER): Payer: Medicare Other | Admitting: Gastroenterology

## 2019-04-12 ENCOUNTER — Other Ambulatory Visit: Payer: Self-pay

## 2019-04-12 ENCOUNTER — Encounter: Payer: Self-pay | Admitting: Gastroenterology

## 2019-04-12 VITALS — Ht 67.0 in | Wt 190.0 lb

## 2019-04-12 DIAGNOSIS — R1310 Dysphagia, unspecified: Secondary | ICD-10-CM | POA: Diagnosis not present

## 2019-04-12 DIAGNOSIS — K449 Diaphragmatic hernia without obstruction or gangrene: Secondary | ICD-10-CM | POA: Diagnosis not present

## 2019-04-12 DIAGNOSIS — K2100 Gastro-esophageal reflux disease with esophagitis, without bleeding: Secondary | ICD-10-CM

## 2019-04-12 DIAGNOSIS — R1319 Other dysphagia: Secondary | ICD-10-CM
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
## 2019-04-12 NOTE — Patient Instructions (Signed)
To help prevent the possible spread of infection to our patients, communities, and staff; we will be implementing the following measures:  As of now we are not allowing any visitors/family members to accompany you to any upcoming appointments with [facility]. If you have any concerns about this please contact our office to discuss prior to the appointment.   You have been scheduled for an endoscopy. Please follow written instructions given to you at your visit today. If you use inhalers (even only as needed), please bring them with you on the day of your procedure. Your physician has requested that you go to www.startemmi.com and enter the access code given to you at your visit today. This web site gives a general overview about your procedure. However, you should still follow specific instructions given to you by our office regarding your preparation for the procedure.  For more information on the TIF procedure please visit the website at www.GerdHelp.com  It was a pleasure to see you today!  Vito Cirigliano, D.O.  

## 2019-04-17 NOTE — Telephone Encounter (Signed)
Tell him [name] does not require a doctor order, he can call them for an interview

## 2019-04-19 DIAGNOSIS — M7981 Nontraumatic hematoma of soft tissue: Secondary | ICD-10-CM | POA: Diagnosis not present

## 2019-04-19 DIAGNOSIS — I8311 Varicose veins of right lower extremity with inflammation: Secondary | ICD-10-CM | POA: Diagnosis not present

## 2019-04-25 DIAGNOSIS — R948 Abnormal results of function studies of other organs and systems: Secondary | ICD-10-CM | POA: Diagnosis not present

## 2019-04-25 DIAGNOSIS — E291 Testicular hypofunction: Secondary | ICD-10-CM | POA: Diagnosis not present

## 2019-04-28 ENCOUNTER — Telehealth: Payer: Self-pay | Admitting: *Deleted

## 2019-04-28 NOTE — Telephone Encounter (Addendum)
Attempted to complete pre procedure screening. No answer. Name identifier. LMOM.  2nd attempt to reach patient for pre procedure information. No answer. Left detailed message on machine regarding care partner and mask being worn into the building.

## 2019-05-01 ENCOUNTER — Other Ambulatory Visit: Payer: Self-pay

## 2019-05-01 ENCOUNTER — Ambulatory Visit (AMBULATORY_SURGERY_CENTER): Payer: Medicare Other | Admitting: Gastroenterology

## 2019-05-01 ENCOUNTER — Encounter: Payer: Self-pay | Admitting: Gastroenterology

## 2019-05-01 VITALS — BP 132/80 | HR 89 | Temp 98.5°F | Resp 15 | Ht 67.0 in | Wt 190.0 lb

## 2019-05-01 DIAGNOSIS — K2970 Gastritis, unspecified, without bleeding: Secondary | ICD-10-CM | POA: Diagnosis not present

## 2019-05-01 DIAGNOSIS — K449 Diaphragmatic hernia without obstruction or gangrene: Secondary | ICD-10-CM

## 2019-05-01 DIAGNOSIS — R1310 Dysphagia, unspecified: Secondary | ICD-10-CM | POA: Diagnosis not present

## 2019-05-01 DIAGNOSIS — K2990 Gastroduodenitis, unspecified, without bleeding: Secondary | ICD-10-CM

## 2019-05-01 DIAGNOSIS — K2100 Gastro-esophageal reflux disease with esophagitis, without bleeding: Secondary | ICD-10-CM

## 2019-05-01 DIAGNOSIS — K219 Gastro-esophageal reflux disease without esophagitis: Secondary | ICD-10-CM | POA: Diagnosis not present

## 2019-05-01 DIAGNOSIS — G4733 Obstructive sleep apnea (adult) (pediatric): Secondary | ICD-10-CM | POA: Diagnosis not present

## 2019-05-01 MED ORDER — SODIUM CHLORIDE 0.9 % IV SOLN: 500.0000 mL | Freq: Once | INTRAVENOUS | Status: DC

## 2019-05-01 NOTE — Progress Notes (Signed)
Report to PACU, RN, vss, BBS= Clear.  

## 2019-05-01 NOTE — Progress Notes (Signed)
Pt. Reports no change in his medical or surgical history since his pre-visit 04/12/2019.

## 2019-05-01 NOTE — Progress Notes (Signed)
Pt verbalize he no longer has to take Eliquis.

## 2019-05-01 NOTE — Patient Instructions (Signed)
YOU HAD AN ENDOSCOPIC PROCEDURE TODAY AT [facility]:   Refer to the procedure report that was given to you for any specific questions about what was found during the examination.  If the procedure report does not answer your questions, please call your gastroenterologist to clarify.  If you requested that your care partner not be given the details of your procedure findings, then the procedure report has been included in a sealed envelope for you to review at your convenience later.  YOU SHOULD EXPECT: Some feelings of bloating in the abdomen. Passage of more gas than usual.  Walking can help get rid of the air that was put into your GI tract during the procedure and reduce the bloating. If you had a lower endoscopy (such as a colonoscopy or flexible sigmoidoscopy) you may notice spotting of blood in your stool or on the toilet paper. If you underwent a bowel prep for your procedure, you may not have a normal bowel movement for a few days.  Please Note:  You might notice some irritation and congestion in your nose or some drainage.  This is from the oxygen used during your procedure.  There is no need for concern and it should clear up in a day or so.  SYMPTOMS TO REPORT IMMEDIATELY:    Following upper endoscopy (EGD)  Vomiting of blood or coffee ground material  New chest pain or pain under the shoulder blades  Painful or persistently difficult swallowing  New shortness of breath  Fever of 100F or higher  Black, tarry-looking stools  For urgent or emergent issues, a gastroenterologist can be reached at any hour by calling [phone].   DIET:  We do recommend a small meal at first, but then you may proceed to your regular diet.  Drink plenty of fluids but you should avoid alcoholic beverages for 24 hours.  ACTIVITY:  You should plan to take it easy for the rest of today and you should NOT DRIVE or use heavy machinery until tomorrow (because of the sedation medicines used  during the test).    FOLLOW UP: Our staff will call the number listed on your records 48-72 hours following your procedure to check on you and address any questions or concerns that you may have regarding the information given to you following your procedure. If we do not reach you, we will leave a message.  We will attempt to reach you two times.  During this call, we will ask if you have developed any symptoms of COVID 19. If you develop any symptoms (ie: fever, flu-like symptoms, shortness of breath, cough etc.) before then, please call [phone].  If you test positive for Covid 19 in the 2 weeks post procedure, please call and report this information to [place].    If any biopsies were taken you will be contacted by phone or by letter within the next 1-3 weeks.  Please call us at [phone] if you have not heard about the biopsies in 3 weeks.   Await for biopsy results Follow with Dr. Bryan Lemma in GI clinic to schedule plan to proceed with Transoral Incisionless Fundoplication  SIGNATURES/CONFIDENTIALITY: You and/or your care partner have signed paperwork which will be entered into your electronic medical record.  These signatures attest to the fact that that the information above on your After Visit Summary has been reviewed and is understood.  Full responsibility of the confidentiality of this discharge information lies with you and/or your care-partner.

## 2019-05-01 NOTE — Op Note (Signed)
[facility] Patient Name: [name] Procedure Date: 05/01/2019 10:30 AM MRN: [id number] Endoscopist: Doristine Locks , MD Age: 48 Referring MD:  Date of Birth: Feb 18, 1971 Gender: Male Account #: [id number] Procedure:                Upper GI endoscopy Indications:              Reflux esophagitis, Preoperative assessment, Hiatal                            hernia                           48 yo male with a long-standing history of GERD                            complicated by Erosive Esophagitis and small hiatal                            hernia, presents for pre-operative evaluation for                            possible Transoral Incisionless Fundoplication                            (TIF). Medicines:                Monitored Anesthesia Care Procedure:                Pre-Anesthesia Assessment:                           - Prior to the procedure, a History and Physical                            was performed, and patient medications and                            allergies were reviewed. The patient's tolerance of                            previous anesthesia was also reviewed. The risks                            and benefits of the procedure and the sedation                            options and risks were discussed with the patient.                            All questions were answered, and informed consent                            was obtained. Prior Anticoagulants: The patient has                            taken Eliquis (  apixaban), last dose was 2 days                            prior to procedure. ASA Grade Assessment: II - A                            patient with mild systemic disease. After reviewing                            the risks and benefits, the patient was deemed in                            satisfactory condition to undergo the procedure.                           After obtaining informed consent, the endoscope was     passed under direct vision. Throughout the                            procedure, the patient's blood pressure, pulse, and                            oxygen saturations were monitored continuously. The                            Endoscope was introduced through the mouth, and                            advanced to the second part of duodenum. The upper                            GI endoscopy was accomplished without difficulty.                            The patient tolerated the procedure well. Scope In: Scope Out: Findings:                 Mild, LA Grade A (one or more mucosal breaks less                            than 5 mm, not extending between tops of 2 mucosal                            folds) esophagitis with no bleeding was found 38 cm                            from the incisors.                           Esophagogastric landmarks were identified: the                            Z-line was found at 38 cm, the gastroesophageal  junction was found at 38 cm and the site of hiatal                            narrowing was found at 40 cm from the incisors.                           A small, <2 cm sliding-type hiatal hernia was                            present. This measured <1 cm in transverse width on                            retroflexed views.                           The gastroesophageal flap valve was visualized                            endoscopically and classified as Hill Grade II                            (fold present, opens with respiration).                           The upper third of the esophagus and middle third                            of the esophagus were normal.                           Diffuse mild inflammation characterized by erythema                            was found in the gastric fundus and in the gastric                            body. Biopsies were taken with a cold forceps for                            Helicobacter  pylori testing. Estimated blood loss                            was minimal.                           The incisura, gastric antrum and pylorus were                            normal.                           The duodenal bulb, first portion of the duodenum  and second portion of the duodenum were normal. Complications:            No immediate complications. Estimated Blood Loss:     Estimated blood loss was minimal. Impression:               - LA Grade A reflux esophagitis.                           - Esophagogastric landmarks identified.                           - 2 cm hiatal hernia.                           - Gastroesophageal flap valve classified as Hill                            Grade II (fold present, opens with respiration).                           - Normal upper third of esophagus and middle third                            of esophagus.                           - Gastritis. Biopsied.                           - Normal incisura, antrum and pylorus.                           - Normal duodenal bulb, first portion of the                            duodenum and second portion of the duodenum. Recommendation:           - Patient has a contact number available for                            emergencies. The signs and symptoms of potential                            delayed complications were discussed with the                            patient. Return to normal activities tomorrow.                            Written discharge instructions were provided to the                            patient.                           - Resume previous diet today.                           -  Continue present medications.                           - Await pathology results.                           - Follow-up with Dr. Barron Alvine in the GI clinic at                            appointment to be scheduled with plan to proceed                            with Transoral  Incisionless Fundoplication (TIF). Doristine Locks, MD 05/01/2019 11:03:57 AM

## 2019-05-03 ENCOUNTER — Telehealth: Payer: Self-pay | Admitting: *Deleted

## 2019-05-03 ENCOUNTER — Encounter: Payer: Self-pay | Admitting: Gastroenterology

## 2019-05-03 ENCOUNTER — Telehealth: Payer: Self-pay

## 2019-05-03 ENCOUNTER — Encounter: Payer: Self-pay | Admitting: Family Medicine

## 2019-05-03 DIAGNOSIS — I8311 Varicose veins of right lower extremity with inflammation: Secondary | ICD-10-CM | POA: Diagnosis not present

## 2019-05-03 DIAGNOSIS — M7981 Nontraumatic hematoma of soft tissue: Secondary | ICD-10-CM | POA: Diagnosis not present

## 2019-05-03 NOTE — Telephone Encounter (Signed)
  Follow up Call-  Call back number 05/01/2019 08/24/2017  Post procedure Call Back phone  # [phone]  Permission to leave phone message Yes Yes  Some recent data might be hidden     Patient questions:  Do you have a fever, pain , or abdominal swelling? No. Pain Score  0 *  Have you tolerated food without any problems? Yes.    Have you been able to return to your normal activities? Yes.    Do you have any questions about your discharge instructions: Diet   No. Medications  No. Follow up visit  No.  Do you have questions or concerns about your Care? No.     Dr Bryan Lemma- Patient said he is comfortable proceeding with TIF and was wondering if the office visit before is necessary?  He is ready to proceed after July 4th. Please have your nurse or yourself call to discuss need or no need for another office visit.  Actions: * If pain score is 4 or above: No action needed, pain <4.   1. Have you developed a fever since your procedure? NO  2.   Have you had an respiratory symptoms (SOB or cough) since your procedure? NO  3.   Have you tested positive for COVID 19 since your procedure NO  4.   Have you had any family members/close contacts diagnosed with the COVID 19 since your procedure?  NO   If yes to any of these questions please route to Joylene John, RN and Alphonsa Gin, RN.

## 2019-05-03 NOTE — Telephone Encounter (Signed)
  Follow up Call-  Call back number 05/01/2019 08/24/2017  Post procedure Call Back phone  # [phone]  Permission to leave phone message Yes Yes  Some recent data might be hidden     Left message on voicemail Will try again midday

## 2019-05-03 NOTE — Telephone Encounter (Signed)
Dr. Sarajane Jews please advise on refill and to a different pharmacy.

## 2019-05-03 NOTE — Telephone Encounter (Signed)
I spoke with [name] on the phone today.  Agree, we discussed TIF at length at his initial appointment with me, and again after the procedure, and over the phone today.  Agree that we can hold off on another follow-up appointment.  I did discuss that I would like a Esophageal Manometry done to ensure appropriate motility given his history of SLE and Sjogren's, and he understands and agrees.  Provided the EM is normal, plan to proceed with TIF after he returns in July.  Heather, Can you please send a referral for Esophageal Manometry if not already done.  No need for pH or impedance testing.  Okay to keep going on his PPI as currently prescribed.  Thank you.

## 2019-05-05 MED ORDER — AMPHETAMINE-DEXTROAMPHETAMINE 30 MG PO TABS: 30.0000 mg | ORAL_TABLET | Freq: Three times a day (TID) | ORAL | 0 refills | Status: DC

## 2019-05-05 NOTE — Telephone Encounter (Signed)
Done

## 2019-05-10 NOTE — Telephone Encounter (Signed)
LMOM to schedule Eso Mano

## 2019-05-17 ENCOUNTER — Telehealth: Payer: Self-pay

## 2019-05-17 DIAGNOSIS — I8312 Varicose veins of left lower extremity with inflammation: Secondary | ICD-10-CM | POA: Diagnosis not present

## 2019-05-17 NOTE — Telephone Encounter (Signed)
Patient is returning your call.  

## 2019-05-17 NOTE — Telephone Encounter (Signed)
Returned patient's call and got voice mail. Left message to call back

## 2019-05-17 NOTE — Telephone Encounter (Signed)
Spoke to patient, Dr. Bryan Lemma had sugguested cancelling the office visit, since they had already discussed everthing.

## 2019-05-19 ENCOUNTER — Ambulatory Visit: Payer: Medicare Other | Admitting: Gastroenterology

## 2019-05-20 ENCOUNTER — Encounter: Payer: Self-pay | Admitting: Family Medicine

## 2019-05-22 NOTE — Telephone Encounter (Signed)
Dr. Fry please advise. Thanks  

## 2019-05-22 NOTE — Telephone Encounter (Signed)
He will need a Doxy for this, we have not seen him in over a year

## 2019-05-23 DIAGNOSIS — Z79899 Other long term (current) drug therapy: Secondary | ICD-10-CM | POA: Diagnosis not present

## 2019-05-23 DIAGNOSIS — G8929 Other chronic pain: Secondary | ICD-10-CM | POA: Diagnosis not present

## 2019-05-23 DIAGNOSIS — Z5181 Encounter for therapeutic drug level monitoring: Secondary | ICD-10-CM | POA: Diagnosis not present

## 2019-05-23 DIAGNOSIS — Z978 Presence of other specified devices: Secondary | ICD-10-CM | POA: Diagnosis not present

## 2019-05-23 DIAGNOSIS — I7300 Raynaud's syndrome without gangrene: Secondary | ICD-10-CM | POA: Diagnosis not present

## 2019-05-24 ENCOUNTER — Ambulatory Visit (INDEPENDENT_AMBULATORY_CARE_PROVIDER_SITE_OTHER): Payer: Medicare Other | Admitting: Family Medicine

## 2019-05-24 ENCOUNTER — Encounter: Payer: Self-pay | Admitting: Family Medicine

## 2019-05-24 ENCOUNTER — Other Ambulatory Visit: Payer: Self-pay

## 2019-05-24 VITALS — Temp 97.3°F

## 2019-05-24 DIAGNOSIS — F411 Generalized anxiety disorder: Secondary | ICD-10-CM | POA: Diagnosis not present

## 2019-05-24 DIAGNOSIS — R6889 Other general symptoms and signs: Secondary | ICD-10-CM | POA: Diagnosis not present

## 2019-05-24 MED ORDER — ALPRAZOLAM 1 MG PO TABS: 1.0000 mg | ORAL_TABLET | Freq: Three times a day (TID) | ORAL | 5 refills | Status: DC | PRN

## 2019-05-24 NOTE — Progress Notes (Signed)
   Subjective:    Patient ID: [name], male    DOB: November 16, 1971, 48 y.o.   MRN: [id number]  HPI Virtual Visit via Telephone Note  I connected with the patient on 05/24/19 at 10:00 AM EDT by telephone and verified that I am speaking with the correct person using two identifiers.We attempted to connect virtually but we had technical difficulties with the audio and video.     I discussed the limitations, risks, security and privacy concerns of performing an evaluation and management service by telephone and the availability of in person appointments. I also discussed with the patient that there may be a patient responsible charge related to this service. The patient expressed understanding and agreed to proceed.  Location patient: home Location provider: work or home office Participants present for the call: patient, provider Patient did not have a visit in the prior 7 days to address this/these issue(s).   History of Present Illness: Here for several issues. First he needs refill son Xanax. His anxiety is doing very well. He is happy with his job and things are going well with his girlfriend. Second, he asks about testing for antibodies to the Covid-19 virus. In early May he had a few days of coughing with some SOB and a fever. He also lost his sense of taste and smell for a few days. This resolved and then a few days later his girlfriend had similar symptoms. Both of them recovered in about a week. He thinks these could have been infections with the virus.    Observations/Objective: Patient sounds cheerful and well on the phone. I do not appreciate any SOB. Speech and thought processing are grossly intact. Patient reported vitals:  Assessment and Plan: His anxiety is well controlled, and we will refill the Xanax. He Zena Amos been exposed to the Covid-19 virus, so we will arrange for him to be tested for IgG antibodies to this.  Alysia Penna, MD   Follow Up Instructions:      [phone] [phone] 21-30 I did not refer this patient for an OV in the next 24 hours for this/these issue(s).  I discussed the assessment and treatment plan with the patient. The patient was provided an opportunity to ask questions and all were answered. The patient agreed with the plan and demonstrated an understanding of the instructions.   The patient was advised to call back or seek an in-person evaluation if the symptoms worsen or if the condition fails to improve as anticipated.  I provided 14 minutes of non-face-to-face time during this encounter.   Alysia Penna, MD    Review of Systems     Objective:   Physical Exam        Assessment & Plan:

## 2019-07-05 ENCOUNTER — Telehealth: Payer: Self-pay

## 2019-07-05 DIAGNOSIS — R1319 Other dysphagia: Secondary | ICD-10-CM

## 2019-07-05 DIAGNOSIS — R1310 Dysphagia, unspecified: Secondary | ICD-10-CM

## 2019-07-05 DIAGNOSIS — K2100 Gastro-esophageal reflux disease with esophagitis, without bleeding: Secondary | ICD-10-CM

## 2019-07-05 DIAGNOSIS — K2970 Gastritis, unspecified, without bleeding: Secondary | ICD-10-CM

## 2019-07-05 NOTE — Telephone Encounter (Signed)
Left a message for the patient to call back to the office to schedule an eso mano (okay to stay on PPI per MD);

## 2019-07-10 ENCOUNTER — Other Ambulatory Visit: Payer: Self-pay | Admitting: Internal Medicine

## 2019-07-10 DIAGNOSIS — R1310 Dysphagia, unspecified: Secondary | ICD-10-CM

## 2019-07-10 DIAGNOSIS — K2210 Ulcer of esophagus without bleeding: Secondary | ICD-10-CM

## 2019-07-10 NOTE — Telephone Encounter (Signed)
Called and spoke with patient-patient is requesting to be scheduled for his eso mano as soon as possible as his insurance is going to change at end of September (financial reasons); patient has been scheduled for his eso mano on 07/19/2019 at 10:30 am; patient has been scheduled for his COVID screening on 07/15/2019 at 12:10;  Instructions/information has been mailed to patient on 07/10/2019; patient advised to call back to the office should questions/concerns arise; patient verbalized understanding of information/instructions;

## 2019-07-15 ENCOUNTER — Other Ambulatory Visit (HOSPITAL_COMMUNITY)
Admission: RE | Admit: 2019-07-15 | Discharge: 2019-07-15 | Disposition: A | Discharge status: Home / Self Care | Payer: Medicare Other | Source: Ambulatory Visit | Attending: Gastroenterology | Admitting: Gastroenterology

## 2019-07-15 DIAGNOSIS — Z01812 Encounter for preprocedural laboratory examination: Secondary | ICD-10-CM | POA: Diagnosis not present

## 2019-07-15 DIAGNOSIS — Z20828 Contact with and (suspected) exposure to other viral communicable diseases: Secondary | ICD-10-CM | POA: Diagnosis not present

## 2019-07-16 LAB — SARS CORONAVIRUS 2 (TAT 6-24 HRS): SARS Coronavirus 2: NEGATIVE

## 2019-07-19 ENCOUNTER — Ambulatory Visit (HOSPITAL_COMMUNITY)
Admission: RE | Admit: 2019-07-19 | Discharge: 2019-07-19 | Disposition: A | Discharge status: Home / Self Care | Payer: Medicare Other | Attending: Gastroenterology | Admitting: Gastroenterology

## 2019-07-19 ENCOUNTER — Encounter (HOSPITAL_COMMUNITY)
Admission: RE | Disposition: A | Discharge status: Home / Self Care | Payer: Self-pay | Source: Home / Self Care | Attending: Gastroenterology

## 2019-07-19 DIAGNOSIS — K449 Diaphragmatic hernia without obstruction or gangrene: Secondary | ICD-10-CM | POA: Insufficient documentation

## 2019-07-19 DIAGNOSIS — Z881 Allergy status to other antibiotic agents status: Secondary | ICD-10-CM | POA: Diagnosis not present

## 2019-07-19 DIAGNOSIS — M3500 Sicca syndrome, unspecified: Secondary | ICD-10-CM | POA: Diagnosis not present

## 2019-07-19 DIAGNOSIS — M329 Systemic lupus erythematosus, unspecified: Secondary | ICD-10-CM | POA: Insufficient documentation

## 2019-07-19 DIAGNOSIS — R12 Heartburn: Secondary | ICD-10-CM | POA: Diagnosis not present

## 2019-07-19 DIAGNOSIS — Z882 Allergy status to sulfonamides status: Secondary | ICD-10-CM | POA: Diagnosis not present

## 2019-07-19 DIAGNOSIS — K219 Gastro-esophageal reflux disease without esophagitis: Secondary | ICD-10-CM | POA: Diagnosis not present

## 2019-07-19 DIAGNOSIS — K210 Gastro-esophageal reflux disease with esophagitis: Secondary | ICD-10-CM | POA: Diagnosis not present

## 2019-07-19 DIAGNOSIS — I7300 Raynaud's syndrome without gangrene: Secondary | ICD-10-CM | POA: Diagnosis not present

## 2019-07-19 DIAGNOSIS — Z79899 Other long term (current) drug therapy: Secondary | ICD-10-CM | POA: Diagnosis not present

## 2019-07-19 HISTORY — PX: ESOPHAGEAL MANOMETRY: SHX5429

## 2019-07-19 SURGERY — MANOMETRY, ESOPHAGUS
Anesthesia: Choice

## 2019-07-19 MED ORDER — LIDOCAINE VISCOUS HCL 2 % MT SOLN
OROMUCOSAL | Status: AC
  Filled 2019-07-19: qty 15

## 2019-07-19 SURGICAL SUPPLY — 2 items
FACESHIELD LNG OPTICON STERILE (SAFETY) IMPLANT
GLOVE BIO SURGEON STRL SZ8 (GLOVE) ×4 IMPLANT

## 2019-07-19 NOTE — Progress Notes (Signed)
Esophageal Manometry done per protocol. Pt tolerated well without distress or complication.  
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
## 2019-07-20 ENCOUNTER — Encounter (HOSPITAL_COMMUNITY): Payer: Self-pay | Admitting: Gastroenterology

## 2019-07-20 DIAGNOSIS — G894 Chronic pain syndrome: Secondary | ICD-10-CM | POA: Diagnosis not present

## 2019-07-20 DIAGNOSIS — K449 Diaphragmatic hernia without obstruction or gangrene: Secondary | ICD-10-CM

## 2019-07-20 DIAGNOSIS — Z79899 Other long term (current) drug therapy: Secondary | ICD-10-CM | POA: Diagnosis not present

## 2019-07-20 DIAGNOSIS — Z5181 Encounter for therapeutic drug level monitoring: Secondary | ICD-10-CM | POA: Diagnosis not present

## 2019-07-20 DIAGNOSIS — Z978 Presence of other specified devices: Secondary | ICD-10-CM | POA: Diagnosis not present

## 2019-07-24 DIAGNOSIS — L539 Erythematous condition, unspecified: Secondary | ICD-10-CM | POA: Diagnosis not present

## 2019-07-24 DIAGNOSIS — Z888 Allergy status to other drugs, medicaments and biological substances status: Secondary | ICD-10-CM | POA: Diagnosis not present

## 2019-07-24 DIAGNOSIS — Z882 Allergy status to sulfonamides status: Secondary | ICD-10-CM | POA: Diagnosis not present

## 2019-07-24 DIAGNOSIS — Z79899 Other long term (current) drug therapy: Secondary | ICD-10-CM | POA: Diagnosis not present

## 2019-07-24 DIAGNOSIS — Z881 Allergy status to other antibiotic agents status: Secondary | ICD-10-CM | POA: Diagnosis not present

## 2019-07-24 DIAGNOSIS — L03113 Cellulitis of right upper limb: Secondary | ICD-10-CM | POA: Diagnosis not present

## 2019-07-26 ENCOUNTER — Other Ambulatory Visit: Payer: Self-pay

## 2019-07-26 ENCOUNTER — Ambulatory Visit: Payer: Self-pay | Admitting: *Deleted

## 2019-07-26 ENCOUNTER — Encounter: Payer: Self-pay | Admitting: Family Medicine

## 2019-07-26 DIAGNOSIS — R1319 Other dysphagia: Secondary | ICD-10-CM

## 2019-07-26 DIAGNOSIS — K2210 Ulcer of esophagus without bleeding: Secondary | ICD-10-CM

## 2019-07-26 DIAGNOSIS — K449 Diaphragmatic hernia without obstruction or gangrene: Secondary | ICD-10-CM

## 2019-07-26 DIAGNOSIS — R1310 Dysphagia, unspecified: Secondary | ICD-10-CM

## 2019-07-26 DIAGNOSIS — K2100 Gastro-esophageal reflux disease with esophagitis, without bleeding: Secondary | ICD-10-CM

## 2019-07-26 DIAGNOSIS — K2970 Gastritis, unspecified, without bleeding: Secondary | ICD-10-CM

## 2019-07-26 DIAGNOSIS — K2990 Gastroduodenitis, unspecified, without bleeding: Secondary | ICD-10-CM

## 2019-07-26 NOTE — Telephone Encounter (Signed)
Spoke with patient over the phone. Appointment has been scheduled.

## 2019-07-26 NOTE — Telephone Encounter (Signed)
Spoke with the patient he has been scheduled for Friday 07/28/2019.

## 2019-07-26 NOTE — Telephone Encounter (Signed)
I understand. I have not prescribed him any testosterone for 3 years so he is getting it from someone else. He also sent as message that he is concerned about a possible UTI or STDs. Set up an in person OV with me to address all these issues

## 2019-07-26 NOTE — Telephone Encounter (Signed)
Please advise 

## 2019-07-26 NOTE — Telephone Encounter (Signed)
Patient's significant other ( called with concerns regarding the patient-medications he is taking including Adderall and Testosterone injections. Reporting he has become very impatient/ill-attitude threatening/forceful with her over the last two weeks and getting by on less than 3-4 hours sleep at night, insisting she be up with him for sexual intercourse. She is reporting this as a change in his behavior that began approximately 2 weeks ago. The patient has a current MyChart message from today to Dr. Sarajane Jews. Please see that encounter. Advised her if she felt her life was in danger to call 911. Stated she understood.  Reason for Disposition . Health Information question, no triage required and triager able to answer question  Answer Assessment - Initial Assessment Questions 1. REASON FOR CALL or QUESTION: "What is your reason for calling today?" or "How can I best help you?" or "What question do you have that I can help answer?"     Partner acting out of control with the testosterone medication he takes.  Protocols used: INFORMATION ONLY CALL - NO TRIAGE-A-AH

## 2019-07-27 NOTE — Progress Notes (Signed)
Patient has Medicare A and B Precert is not required for TIF procedure. Patient has been notified of this information.

## 2019-07-28 ENCOUNTER — Encounter: Payer: Self-pay | Admitting: Family Medicine

## 2019-07-28 ENCOUNTER — Ambulatory Visit (INDEPENDENT_AMBULATORY_CARE_PROVIDER_SITE_OTHER): Payer: Medicare Other | Admitting: Family Medicine

## 2019-07-28 ENCOUNTER — Other Ambulatory Visit: Payer: Self-pay

## 2019-07-28 VITALS — BP 140/80 | Temp 98.9°F | Wt 189.9 lb

## 2019-07-28 DIAGNOSIS — Z209 Contact with and (suspected) exposure to unspecified communicable disease: Secondary | ICD-10-CM | POA: Diagnosis not present

## 2019-07-28 DIAGNOSIS — R1319 Other dysphagia: Secondary | ICD-10-CM

## 2019-07-28 DIAGNOSIS — K2100 Gastro-esophageal reflux disease with esophagitis, without bleeding: Secondary | ICD-10-CM

## 2019-07-28 DIAGNOSIS — R1310 Dysphagia, unspecified: Secondary | ICD-10-CM

## 2019-07-28 DIAGNOSIS — R300 Dysuria: Secondary | ICD-10-CM

## 2019-07-28 DIAGNOSIS — L03113 Cellulitis of right upper limb: Secondary | ICD-10-CM

## 2019-07-28 LAB — POCT URINALYSIS DIPSTICK
Bilirubin, UA: NEGATIVE
Blood, UA: NEGATIVE
Glucose, UA: NEGATIVE
Ketones, UA: NEGATIVE
Leukocytes, UA: NEGATIVE
Nitrite, UA: NEGATIVE
Protein, UA: POSITIVE — AB
Spec Grav, UA: 1.015 (ref 1.010–1.025)
Urobilinogen, UA: 0.2 E.U./dL
pH, UA: 8 (ref 5.0–8.0)

## 2019-07-28 MED ORDER — AMPHETAMINE-DEXTROAMPHETAMINE 30 MG PO TABS: 30.0000 mg | ORAL_TABLET | Freq: Three times a day (TID) | ORAL | 0 refills | Status: DC

## 2019-07-28 MED ORDER — CIPROFLOXACIN HCL 500 MG PO TABS: 500.0000 mg | ORAL_TABLET | Freq: Two times a day (BID) | ORAL | 0 refills | Status: DC

## 2019-07-28 NOTE — Progress Notes (Signed)
TIF orders placed 
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
## 2019-08-01 LAB — URINE CULTURE
MICRO NUMBER:: 849620
Result:: NO GROWTH
SPECIMEN QUALITY:: ADEQUATE

## 2019-08-01 LAB — C. TRACHOMATIS/N. GONORRHOEAE RNA
C. trachomatis RNA, TMA: NOT DETECTED
N. gonorrhoeae RNA, TMA: NOT DETECTED

## 2019-08-01 LAB — RPR: RPR Ser Ql: NONREACTIVE

## 2019-08-01 LAB — HIV ANTIBODY (ROUTINE TESTING W REFLEX): HIV 1&2 Ab, 4th Generation: NONREACTIVE

## 2019-08-02 ENCOUNTER — Encounter: Payer: Self-pay | Admitting: Family Medicine

## 2019-08-03 NOTE — Telephone Encounter (Signed)
That is correct. We often cannot isolate the bacteria from a prostatitis like we can a bladder or kidney infection,but it sounds like he is responding to treatment

## 2019-08-03 NOTE — Telephone Encounter (Signed)
See my Result Note  

## 2019-08-03 NOTE — Telephone Encounter (Signed)
Please advise. I do not see that these have been resulted.  

## 2019-08-14 ENCOUNTER — Telehealth: Payer: Self-pay | Admitting: Family Medicine

## 2019-08-14 NOTE — Telephone Encounter (Signed)
rx refill Medication ciprofloxacin (CIPRO) 500 MG tablet  [place], [place] - [phone] [address] [phone] (Phone) [phone] (Fax)

## 2019-08-15 ENCOUNTER — Encounter: Payer: Self-pay | Admitting: Family Medicine

## 2019-08-16 ENCOUNTER — Encounter: Payer: Self-pay | Admitting: Family Medicine

## 2019-08-16 NOTE — Telephone Encounter (Signed)
Call in Cipro 500 mg bid for 30 days to make sure we get it all

## 2019-08-17 MED ORDER — CIPROFLOXACIN HCL 500 MG PO TABS: 500.0000 mg | ORAL_TABLET | Freq: Two times a day (BID) | ORAL | 0 refills | Status: DC

## 2019-08-18 ENCOUNTER — Encounter: Payer: Self-pay | Admitting: Family Medicine

## 2019-08-18 NOTE — Telephone Encounter (Signed)
Done

## 2019-08-21 NOTE — Telephone Encounter (Signed)
I already refilled these on 08-17-19

## 2019-08-31 NOTE — Progress Notes (Signed)
Pre-procedural call attempted for pt's 09-04-19 Esophagogastroduodenoscopy with Dr. Bryan Lemma. Voice message left.

## 2019-09-01 ENCOUNTER — Other Ambulatory Visit (HOSPITAL_COMMUNITY)
Admission: RE | Admit: 2019-09-01 | Discharge: 2019-09-01 | Disposition: A | Discharge status: Home / Self Care | Payer: Medicare Other | Source: Ambulatory Visit | Attending: Gastroenterology | Admitting: Gastroenterology

## 2019-09-01 NOTE — Progress Notes (Signed)
Pt contacted regarding missed covid appointment. VM left with callback number.   Terrel Manalo N Winford Hehn, RN  

## 2019-09-01 NOTE — Progress Notes (Signed)
Pt was a no show for covid testing today.   Jacqlyn Larsen, RN

## 2019-09-01 NOTE — Progress Notes (Signed)
Left message with patient voicemail regarding Covid test. Reminded patient he need to have it done today. Left message regarding procedure on Monday with Dr. Bryan Lemma.

## 2019-09-02 ENCOUNTER — Encounter (HOSPITAL_COMMUNITY): Payer: Self-pay | Admitting: Certified Registered Nurse Anesthetist

## 2019-09-02 NOTE — Progress Notes (Signed)
Called and talked with Dr. Vivia Ewing nurse at [facility]. Made her aware that patient had not shown up for Covid test and that we have not been able to get in touch with him but have left messages. His nurse stated they would try to get in touch with him to come be Covid tested.

## 2019-09-02 NOTE — Progress Notes (Signed)
Left voicemail for patient regarding missed covid testing appointment yesterday. Left call back number with instructions to call back to reschedule.

## 2019-09-03 NOTE — Progress Notes (Signed)
Called and left message on patient voice mail that his procedure scheduled for 10/12 with Dr. Bryan Lemma will need to be rescheduled due to his no show for his Covid test. Asked the patient to call Dr. De Hollingshead office at [phone] tomorrow to reschedule .

## 2019-09-04 ENCOUNTER — Encounter (HOSPITAL_COMMUNITY): Admission: RE | Payer: Self-pay | Source: Home / Self Care

## 2019-09-04 ENCOUNTER — Other Ambulatory Visit: Payer: Self-pay | Admitting: Family Medicine

## 2019-09-04 ENCOUNTER — Ambulatory Visit (HOSPITAL_COMMUNITY): Admission: RE | Admit: 2019-09-04 | Payer: Medicare Other | Source: Home / Self Care | Admitting: Gastroenterology

## 2019-09-04 SURGERY — ESOPHAGOGASTRODUODENOSCOPY (EGD) WITH PROPOFOL
Anesthesia: General

## 2019-09-07 NOTE — Telephone Encounter (Signed)
Okay for refill? Please advise 

## 2019-09-08 NOTE — Telephone Encounter (Signed)
Okay for refill?  

## 2019-09-11 ENCOUNTER — Telehealth: Payer: Self-pay | Admitting: Pulmonary Disease

## 2019-09-11 NOTE — Telephone Encounter (Signed)
Pt has not been seen since 11/2016, needs an OV prior to supplies being ordered. Spoke with pt, appt with VS scheduled for 10/23 at 9:45.  Nothing further needed at this time- will close encounter.

## 2019-09-15 ENCOUNTER — Other Ambulatory Visit: Payer: Self-pay

## 2019-09-15 ENCOUNTER — Ambulatory Visit (INDEPENDENT_AMBULATORY_CARE_PROVIDER_SITE_OTHER): Payer: Medicare Other | Admitting: Pulmonary Disease

## 2019-09-15 ENCOUNTER — Encounter: Payer: Self-pay | Admitting: Pulmonary Disease

## 2019-09-15 ENCOUNTER — Telehealth: Payer: Self-pay | Admitting: Pulmonary Disease

## 2019-09-15 VITALS — BP 128/68 | HR 113 | Temp 97.6°F | Ht 66.5 in | Wt 196.0 lb

## 2019-09-15 DIAGNOSIS — Z23 Encounter for immunization: Secondary | ICD-10-CM

## 2019-09-15 DIAGNOSIS — J849 Interstitial pulmonary disease, unspecified: Secondary | ICD-10-CM

## 2019-09-15 DIAGNOSIS — G4733 Obstructive sleep apnea (adult) (pediatric): Secondary | ICD-10-CM | POA: Diagnosis not present

## 2019-09-15 LAB — CBC WITH DIFFERENTIAL/PLATELET
Basophils Absolute: 0 10*3/uL (ref 0.0–0.1)
Basophils Relative: 0.2 % (ref 0.0–3.0)
Eosinophils Absolute: 0.2 10*3/uL (ref 0.0–0.7)
Eosinophils Relative: 3.9 % (ref 0.0–5.0)
HCT: 47.1 % (ref 39.0–52.0)
Hemoglobin: 15.7 g/dL (ref 13.0–17.0)
Lymphocytes Relative: 21.3 % (ref 12.0–46.0)
Lymphs Abs: 1.3 10*3/uL (ref 0.7–4.0)
MCHC: 33.3 g/dL (ref 30.0–36.0)
MCV: 98.8 fl (ref 78.0–100.0)
Monocytes Absolute: 0.8 10*3/uL (ref 0.1–1.0)
Monocytes Relative: 12.8 % — ABNORMAL HIGH (ref 3.0–12.0)
Neutro Abs: 3.7 10*3/uL (ref 1.4–7.7)
Neutrophils Relative %: 61.8 % (ref 43.0–77.0)
Platelets: 190 10*3/uL (ref 150.0–400.0)
RBC: 4.77 Mil/uL (ref 4.22–5.81)
RDW: 14.6 % (ref 11.5–15.5)
WBC: 6.1 10*3/uL (ref 4.0–10.5)

## 2019-09-15 LAB — COMPREHENSIVE METABOLIC PANEL
ALT: 13 U/L (ref 0–53)
AST: 19 U/L (ref 0–37)
Albumin: 3.5 g/dL (ref 3.5–5.2)
Alkaline Phosphatase: 72 U/L (ref 39–117)
BUN: 10 mg/dL (ref 6–23)
CO2: 33 mEq/L — ABNORMAL HIGH (ref 19–32)
Calcium: 9 mg/dL (ref 8.4–10.5)
Chloride: 100 mEq/L (ref 96–112)
Creatinine, Ser: 1.19 mg/dL (ref 0.40–1.50)
GFR: 64.95 mL/min (ref >60.00)
Glucose, Bld: 84 mg/dL (ref 70–99)
Potassium: 4.1 mEq/L (ref 3.5–5.1)
Sodium: 136 mEq/L (ref 135–145)
Total Bilirubin: 0.4 mg/dL (ref 0.2–1.2)
Total Protein: 7.6 g/dL (ref 6.0–8.3)

## 2019-09-15 LAB — C-REACTIVE PROTEIN: CRP: <1 mg/dL (ref 0.5–20.0)

## 2019-09-15 LAB — SEDIMENTATION RATE: Sed Rate: 28 mm/hr — ABNORMAL HIGH (ref 0–15)
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
## 2019-09-15 NOTE — Patient Instructions (Signed)
Lab tests today  Will arrange for pulmonary function test and high resolution CT chest  Will arrange for new CPAP mask and supplies  Follow up in 3 to 4 weeks

## 2019-09-15 NOTE — Telephone Encounter (Signed)
Spoke with patient.  We decided together it was for his CT that was scheduled. He looked at his mychart and saw all the information related to the CT and location.  Patient states this was probably it, and if he needed anything else he would call us  Nothing further needed at this time.

## 2019-09-18 LAB — ALDOLASE: Aldolase: 4.5 U/L (ref <=8.1)

## 2019-09-18 LAB — ANCA SCREEN W REFLEX TITER: ANCA Screen: NEGATIVE

## 2019-09-18 LAB — RHEUMATOID FACTOR: Rheumatoid fact SerPl-aCnc: <14 IU/mL (ref <14)

## 2019-09-19 ENCOUNTER — Telehealth: Payer: Self-pay

## 2019-09-19 ENCOUNTER — Ambulatory Visit: Payer: Medicare Other | Admitting: Gastroenterology

## 2019-09-19 NOTE — Telephone Encounter (Signed)
Left message for patient to call back to the office to schedule TIF procedure at [facility]

## 2019-09-20 ENCOUNTER — Other Ambulatory Visit: Payer: Self-pay | Admitting: Gastroenterology

## 2019-09-20 DIAGNOSIS — Z5181 Encounter for therapeutic drug level monitoring: Secondary | ICD-10-CM | POA: Diagnosis not present

## 2019-09-20 DIAGNOSIS — R1310 Dysphagia, unspecified: Secondary | ICD-10-CM

## 2019-09-20 DIAGNOSIS — K2100 Gastro-esophageal reflux disease with esophagitis, without bleeding: Secondary | ICD-10-CM

## 2019-09-20 DIAGNOSIS — K449 Diaphragmatic hernia without obstruction or gangrene: Secondary | ICD-10-CM

## 2019-09-20 DIAGNOSIS — Z79899 Other long term (current) drug therapy: Secondary | ICD-10-CM | POA: Diagnosis not present

## 2019-09-20 NOTE — Telephone Encounter (Signed)
Called and spoke with patient - patient is agreeable with plan of care to have TIF procedure on 10/26/2019 at [facility]; instructions and COVID screening appt will be made and sent to patient via MyChart and mail;

## 2019-09-21 ENCOUNTER — Encounter: Payer: Self-pay | Admitting: Family Medicine

## 2019-09-21 LAB — FANA STAINING PATTERNS: Speckled Pattern: >1:1280 {titer} — AB

## 2019-09-21 LAB — ANA+ENA+DNA/DS+SCL 70+SJOSSA/B
ANA Titer 1: POSITIVE — AB
ENA RNP Ab: 0.2 AI (ref 0.0–0.9)
ENA SM Ab Ser-aCnc: >8 AI — ABNORMAL HIGH (ref 0.0–0.9)
ENA SSA (RO) Ab: >8 AI — ABNORMAL HIGH (ref 0.0–0.9)
ENA SSB (LA) Ab: <0.2 AI (ref 0.0–0.9)
Scleroderma (Scl-70) (ENA) Antibody, IgG: <0.2 AI (ref 0.0–0.9)
dsDNA Ab: 2 IU/mL (ref 0–9)

## 2019-09-21 NOTE — Telephone Encounter (Signed)
Patient has been scheduled for TIF on 10/26/2019 at 11:10 am; COVID screening on 10/23/2019 at 11:20 am; instructions have been sent to patient;

## 2019-09-22 ENCOUNTER — Ambulatory Visit (INDEPENDENT_AMBULATORY_CARE_PROVIDER_SITE_OTHER): Payer: Medicare Other

## 2019-09-22 ENCOUNTER — Other Ambulatory Visit: Payer: Self-pay

## 2019-09-22 ENCOUNTER — Encounter: Payer: Self-pay | Admitting: Family Medicine

## 2019-09-22 DIAGNOSIS — J849 Interstitial pulmonary disease, unspecified: Secondary | ICD-10-CM | POA: Diagnosis not present

## 2019-09-22 DIAGNOSIS — Z79899 Other long term (current) drug therapy: Secondary | ICD-10-CM | POA: Diagnosis not present

## 2019-09-22 DIAGNOSIS — R0602 Shortness of breath: Secondary | ICD-10-CM | POA: Diagnosis not present

## 2019-09-22 MED ORDER — CEFTRIAXONE SODIUM 1 G IJ SOLR: 1.0000 g | Freq: Once | INTRAMUSCULAR | 0 refills | Status: AC

## 2019-09-22 NOTE — Telephone Encounter (Signed)
Rx sent to pharmacy   

## 2019-09-22 NOTE — Telephone Encounter (Signed)
Pt notified of update.  

## 2019-09-22 NOTE — Telephone Encounter (Signed)
I sent the rx in

## 2019-09-22 NOTE — Telephone Encounter (Signed)
Please advise Pt wants to know if he can get a Rx for rocephin injection sent to pharmacy for flare up. Pt stated that he had it sent to the pharmacy before to self injection. Pt was advise to go to the local UC. Pt also advised that message will still be routed to Dr.Fry for advise.

## 2019-09-25 ENCOUNTER — Other Ambulatory Visit: Payer: Self-pay

## 2019-09-25 ENCOUNTER — Telehealth: Payer: Self-pay | Admitting: *Deleted

## 2019-09-25 ENCOUNTER — Ambulatory Visit (INDEPENDENT_AMBULATORY_CARE_PROVIDER_SITE_OTHER): Payer: Medicare Other | Admitting: *Deleted

## 2019-09-25 DIAGNOSIS — N419 Inflammatory disease of prostate, unspecified: Secondary | ICD-10-CM

## 2019-09-25 MED ORDER — CEFTRIAXONE SODIUM 1 G IJ SOLR
1.0000 g | Freq: Once | INTRAMUSCULAR | Status: AC
  Administered 2019-09-25: 1 g via INTRAMUSCULAR

## 2019-09-25 MED ORDER — CIPROFLOXACIN HCL 500 MG PO TABS: 500.0000 mg | ORAL_TABLET | Freq: Two times a day (BID) | ORAL | 0 refills | Status: DC

## 2019-09-25 NOTE — Telephone Encounter (Signed)
Rx sent to pt preferred pharmacy. Pt notified of update.

## 2019-09-25 NOTE — Telephone Encounter (Signed)
Call in Cipro 500 mg bid for 30 days

## 2019-09-25 NOTE — Telephone Encounter (Signed)
Patient wanting a refill on cipro for prostatitis. If appropriate please send to [facility]

## 2019-09-25 NOTE — Progress Notes (Signed)
Patient in office for Rocephin 1G. Medication administered with no reactions. Patient waited for 15 minutes with no reaction

## 2019-09-26 DIAGNOSIS — Z888 Allergy status to other drugs, medicaments and biological substances status: Secondary | ICD-10-CM | POA: Diagnosis not present

## 2019-09-26 DIAGNOSIS — M329 Systemic lupus erythematosus, unspecified: Secondary | ICD-10-CM | POA: Diagnosis not present

## 2019-09-26 DIAGNOSIS — I7300 Raynaud's syndrome without gangrene: Secondary | ICD-10-CM | POA: Diagnosis not present

## 2019-09-26 DIAGNOSIS — R300 Dysuria: Secondary | ICD-10-CM | POA: Diagnosis not present

## 2019-09-26 DIAGNOSIS — Z79899 Other long term (current) drug therapy: Secondary | ICD-10-CM | POA: Diagnosis not present

## 2019-09-26 DIAGNOSIS — R52 Pain, unspecified: Secondary | ICD-10-CM | POA: Diagnosis not present

## 2019-09-26 DIAGNOSIS — Z885 Allergy status to narcotic agent status: Secondary | ICD-10-CM | POA: Diagnosis not present

## 2019-09-26 DIAGNOSIS — Z7951 Long term (current) use of inhaled steroids: Secondary | ICD-10-CM | POA: Diagnosis not present

## 2019-09-26 DIAGNOSIS — M5030 Other cervical disc degeneration, unspecified cervical region: Secondary | ICD-10-CM | POA: Diagnosis not present

## 2019-09-26 DIAGNOSIS — Z5181 Encounter for therapeutic drug level monitoring: Secondary | ICD-10-CM | POA: Diagnosis not present

## 2019-09-26 DIAGNOSIS — M3219 Other organ or system involvement in systemic lupus erythematosus: Secondary | ICD-10-CM | POA: Diagnosis not present

## 2019-09-26 DIAGNOSIS — G894 Chronic pain syndrome: Secondary | ICD-10-CM | POA: Diagnosis not present

## 2019-09-26 DIAGNOSIS — Z882 Allergy status to sulfonamides status: Secondary | ICD-10-CM | POA: Diagnosis not present

## 2019-09-27 ENCOUNTER — Telehealth: Payer: Self-pay | Admitting: Pulmonary Disease

## 2019-09-27 NOTE — Telephone Encounter (Signed)
Dr. Halford Chessman, I called the patient back and advised of the results noted below. Patient voiced understanding. However, the patient wanted to know what was the "rate" of progression since the June 2007 CT scan? He said if it was a small or mild progression then fine to either leave it be or referred to RA here in [place]. But if not (and a more severe change), then to be referred back to Dr. Clyde Lundborg at [facility].

## 2019-09-27 NOTE — Telephone Encounter (Signed)
CMP Latest Ref Rng & Units 09/15/2019 11/09/2013 08/08/2012  Glucose 70 - 99 mg/dL 84 94 -  BUN 6 - 23 mg/dL 10 17 -  Creatinine 0.40 - 1.50 mg/dL 1.19 1.20 -  Sodium 135 - 145 mEq/L 136 139 -  Potassium 3.5 - 5.1 mEq/L 4.1 4.0 -  Chloride 96 - 112 mEq/L 100 100 -  CO2 19 - 32 mEq/L 33(H) 31 -  Calcium 8.4 - 10.5 mg/dL 9.0 9.4 -  Total Protein 6.0 - 8.3 g/dL 7.6 8.6(H) 7.2  Total Bilirubin 0.2 - 1.2 mg/dL 0.4 0.9 0.7  Alkaline Phos 39 - 117 U/L 72 64 55  AST 0 - 37 U/L 19 20 21   ALT 0 - 53 U/L 13 13 14     CBC    Component Value Date/Time   WBC 6.1 09/15/2019 1035   RBC 4.77 09/15/2019 1035   HGB 15.7 09/15/2019 1035   HCT 47.1 09/15/2019 1035   PLT 190.0 09/15/2019 1035   MCV 98.8 09/15/2019 1035   MCH 33.1 11/10/2013 0636   MCHC 33.3 09/15/2019 1035   RDW 14.6 09/15/2019 1035   LYMPHSABS 1.3 09/15/2019 1035   MONOABS 0.8 09/15/2019 1035   EOSABS 0.2 09/15/2019 1035   BASOSABS 0.0 09/15/2019 1035    Serology 09/15/19 >> ANA positive > 1:1280, SM Ab > 8, SSA Ab > 8; ds DNA Ab negative, SCL 70 negative, SSB Ab negative, CRP less than 1, ANCA negative, RF < 14, Aldolast 4.5, ANCA negative  HRCT chest 09/22/19 >> atherosclerosis, extensive septal thickening, subpleural reticulation, thickening of the peribronchovascular interstitium, cylindrical BTX in some areas of honeycombing in the lungs b/l, definitive craniocaudal gradient (UIP pattern)  Please let him know that lab tests showed elevation in antibodies from history of lupus and Sjogren's disease.  CT chest shows progression of scarring in lungs compared to June 2007 CT scan.  He needs referral back to rheumatology (previously seen by Dr. Kathrine Haddock Criscione at [facility]).  Please arrange for referral to rheumatology either at [facility] or he can have referral to rheumatology in [place] if he prefers.  Will discuss details about next steps for pulmonary management at his ROV later this month.

## 2019-09-28 NOTE — Telephone Encounter (Signed)
While there has been progression, I won't be able to fully quantify until he has his PFT.  He can get referral to rheumatology here in [place] to start with.

## 2019-09-28 NOTE — Telephone Encounter (Signed)
LVM for [name] with [employer] to get status of the referral made and received by [employer] on 09/15/19. The patient stated during the call he had not heard anything from [employer] about his supplies.  LVMTCB x 1 for patient (need to advise him of response received from Dr. Halford Chessman).

## 2019-09-29 NOTE — Telephone Encounter (Signed)
Called and spoke with pt letting him know that we had contacted [employer] and was still waiting to hear from them in regards to supplies.  Also stated to pt the info from VS that he would not be able to fully qualify until he has a PFT and pt verbalized understanding.   Pt did want to go ahead and get PFT scheduled and I did tell him that we were currently booked out until January. I told pt that the PFT could be done at the hospital if he wanted to get it done asap but stated to him that it would be more costly to get the PFT at the hospital and after pt was made aware of that, he said he would go ahead and get on the schedule at our office for January.  I asked pt if he wanted to go ahead and be referred to rheumatology and he said he would wait to discuss that with VS at upcoming [place] with him.  Dr. Halford Chessman, please advise if PFT in January is okay or if we need to try to get this moved up sooner for pt?

## 2019-09-29 NOTE — Telephone Encounter (Signed)
Okay to do PFT in January.

## 2019-09-29 NOTE — Telephone Encounter (Signed)
Pt aware of appts.

## 2019-09-29 NOTE — Telephone Encounter (Signed)
Pt returning phone call ° °

## 2019-09-29 NOTE — Telephone Encounter (Signed)
Noted.   Will route to [facility] pool. Can we schedule this patient PFT with OV with sood if available in January per VS. If no schedule for VS please schedule with App. Thanks.

## 2019-10-08 ENCOUNTER — Other Ambulatory Visit: Payer: Self-pay | Admitting: Family Medicine

## 2019-10-13 ENCOUNTER — Other Ambulatory Visit: Payer: Self-pay | Admitting: Family Medicine

## 2019-10-13 ENCOUNTER — Ambulatory Visit: Payer: Medicare Other | Admitting: Pulmonary Disease

## 2019-10-17 ENCOUNTER — Other Ambulatory Visit: Payer: Self-pay | Admitting: Family Medicine

## 2019-10-17 NOTE — Telephone Encounter (Signed)
Pt called in to follow up on request. Pt says that he is completely out of his medication and need this medication as soon as possible. Pt would like further assistance.

## 2019-10-17 NOTE — Telephone Encounter (Signed)
Okay for refill?   Last Ov 07/2019  Last refil 08/2019 Qty. [place]

## 2019-10-18 ENCOUNTER — Telehealth: Payer: Self-pay

## 2019-10-18 MED ORDER — ZOLPIDEM TARTRATE 10 MG PO TABS: ORAL_TABLET | ORAL | 5 refills | Status: DC

## 2019-10-18 NOTE — Telephone Encounter (Signed)
Left message for patient to call back to the office to be informed his procedure on 10/26/2019 is being postponed due to [place] restrictions;

## 2019-10-21 ENCOUNTER — Other Ambulatory Visit (HOSPITAL_COMMUNITY)
Admission: RE | Admit: 2019-10-21 | Discharge: 2019-10-21 | Disposition: A | Discharge status: Home / Self Care | Payer: Medicare Other | Source: Ambulatory Visit | Attending: Gastroenterology | Admitting: Gastroenterology

## 2019-10-23 ENCOUNTER — Ambulatory Visit (INDEPENDENT_AMBULATORY_CARE_PROVIDER_SITE_OTHER): Payer: Medicare Other | Admitting: Pulmonary Disease

## 2019-10-23 ENCOUNTER — Other Ambulatory Visit (HOSPITAL_COMMUNITY)
Admission: RE | Admit: 2019-10-23 | Discharge: 2019-10-23 | Disposition: A | Discharge status: Home / Self Care | Payer: Medicare Other | Source: Ambulatory Visit | Attending: Gastroenterology | Admitting: Gastroenterology

## 2019-10-23 ENCOUNTER — Other Ambulatory Visit: Payer: Self-pay

## 2019-10-23 ENCOUNTER — Encounter: Payer: Self-pay | Admitting: Pulmonary Disease

## 2019-10-23 VITALS — BP 138/90 | HR 86 | Temp 98.2°F | Ht 66.5 in | Wt 195.6 lb

## 2019-10-23 DIAGNOSIS — M3502 Sicca syndrome with lung involvement: Secondary | ICD-10-CM

## 2019-10-23 DIAGNOSIS — Z20828 Contact with and (suspected) exposure to other viral communicable diseases: Secondary | ICD-10-CM | POA: Diagnosis not present

## 2019-10-23 DIAGNOSIS — J849 Interstitial pulmonary disease, unspecified: Secondary | ICD-10-CM | POA: Diagnosis not present

## 2019-10-23 DIAGNOSIS — Z01812 Encounter for preprocedural laboratory examination: Secondary | ICD-10-CM | POA: Insufficient documentation

## 2019-10-23 DIAGNOSIS — G4733 Obstructive sleep apnea (adult) (pediatric): Secondary | ICD-10-CM | POA: Diagnosis not present

## 2019-10-23 MED ORDER — PREDNISONE 10 MG PO TABS: ORAL_TABLET | ORAL | 0 refills | Status: DC

## 2019-10-23 NOTE — Telephone Encounter (Signed)
Patient returned call to the office- patient given information and is agreeable with plan of care; patient will be placed on the back log of procedures that need to be completed at [facility]; patient will be contacted once restrictions have been lifted for procedures to be scheduled; Patient verbalized understanding of information/instructions;   Patient advised to call back to the office at [phone] should questions/concerns arise;

## 2019-10-23 NOTE — Patient Instructions (Signed)
Will have your start prednisone with tapering dose down to 5 mg daily, and stay on 5 mg prednisone daily until your appointment in January  Will arrange for overnight oxygen test with CPAP  Will arrange for referral to rheumatology at [facility]  Follow up in January 2021 after you have your pulmonary function test
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
## 2019-10-23 NOTE — Telephone Encounter (Signed)
Left message for patient to call back to the office;  

## 2019-10-24 DIAGNOSIS — R52 Pain, unspecified: Secondary | ICD-10-CM | POA: Diagnosis not present

## 2019-10-24 DIAGNOSIS — G894 Chronic pain syndrome: Secondary | ICD-10-CM | POA: Diagnosis not present

## 2019-10-24 DIAGNOSIS — M5030 Other cervical disc degeneration, unspecified cervical region: Secondary | ICD-10-CM | POA: Diagnosis not present

## 2019-10-24 DIAGNOSIS — E291 Testicular hypofunction: Secondary | ICD-10-CM | POA: Diagnosis not present

## 2019-10-24 DIAGNOSIS — Z79899 Other long term (current) drug therapy: Secondary | ICD-10-CM | POA: Diagnosis not present

## 2019-10-24 DIAGNOSIS — Z5181 Encounter for therapeutic drug level monitoring: Secondary | ICD-10-CM | POA: Diagnosis not present

## 2019-10-24 DIAGNOSIS — I7300 Raynaud's syndrome without gangrene: Secondary | ICD-10-CM | POA: Diagnosis not present

## 2019-10-24 LAB — NOVEL CORONAVIRUS, NAA (HOSP ORDER, SEND-OUT TO REF LAB; TAT 18-24 HRS): SARS-CoV-2, NAA: NOT DETECTED

## 2019-10-26 ENCOUNTER — Encounter (HOSPITAL_COMMUNITY): Admission: RE | Payer: Self-pay | Source: Home / Self Care

## 2019-10-26 ENCOUNTER — Ambulatory Visit (HOSPITAL_COMMUNITY): Admission: RE | Admit: 2019-10-26 | Payer: Medicare Other | Source: Home / Self Care | Admitting: Gastroenterology

## 2019-10-26 SURGERY — ESOPHAGOGASTRODUODENOSCOPY (EGD) WITH PROPOFOL
Anesthesia: General

## 2019-10-31 DIAGNOSIS — E291 Testicular hypofunction: Secondary | ICD-10-CM | POA: Diagnosis not present

## 2019-11-01 ENCOUNTER — Other Ambulatory Visit: Payer: Self-pay | Admitting: Family Medicine

## 2019-11-02 ENCOUNTER — Other Ambulatory Visit: Payer: Self-pay | Admitting: Family Medicine

## 2019-11-02 NOTE — Telephone Encounter (Signed)
Requested medication (s) are due for refill today: yes  Requested medication (s) are on the active medication list: yes  Last refill:  07/28/2019  Future visit scheduled: no  Notes to clinic:  refill cannot be delegated    Requested Prescriptions  Pending Prescriptions Disp Refills   amphetamine-dextroamphetamine (ADDERALL) 30 MG tablet 90 tablet 0    Sig: Take 1 tablet by mouth 3 (three) times daily.      Not Delegated - Psychiatry:  Stimulants/ADHD Failed - 11/02/2019  1:32 PM      Failed - This refill cannot be delegated      Failed - Urine Drug Screen completed in last 360 days.      Failed - Valid encounter within last 3 months    Recent Outpatient Visits           3 months ago [facility] [place], MD   5 months ago Generalized anxiety disorder   Therapist, music at [facility], Ishmael Holter, MD   1 year ago Cellulitis of right [place] at [facility], Alinda Sierras, MD   2 years ago Esophageal dysphagia   Therapist, music at [facility] [employer], Ishmael Holter, MD   3 years ago Impotence of organic origin   Therapist, music at [facility], Ishmael Holter, MD       Future Appointments             In 3 weeks Martyn Ehrich, NP [facility]

## 2019-11-02 NOTE — Telephone Encounter (Signed)
Medication Refill - Medication: amphetamine-dextroamphetamine (ADDERALL) 30 MG tablet   Preferred Pharmacy:  [id number] - [place], [place] - [address] AT [address] Phone:  [phone]  Fax:  [phone]       Pt was advised that RX refills may take up to 3 business days. We ask that you follow-up with your pharmacy.

## 2019-11-03 ENCOUNTER — Encounter: Payer: Self-pay | Admitting: Family Medicine

## 2019-11-06 MED ORDER — AMPHETAMINE-DEXTROAMPHETAMINE 30 MG PO TABS: 30.0000 mg | ORAL_TABLET | Freq: Three times a day (TID) | ORAL | 0 refills | Status: DC

## 2019-11-06 NOTE — Telephone Encounter (Signed)
Pt calling to check status. Please advise  °

## 2019-11-06 NOTE — Telephone Encounter (Signed)
Done

## 2019-11-08 MED ORDER — AMPHETAMINE-DEXTROAMPHETAMINE 30 MG PO TABS: 30.0000 mg | ORAL_TABLET | Freq: Three times a day (TID) | ORAL | 0 refills | Status: DC

## 2019-11-08 NOTE — Telephone Encounter (Signed)
These were sent to [facility]

## 2019-11-25 ENCOUNTER — Other Ambulatory Visit (HOSPITAL_COMMUNITY): Payer: Medicare Other

## 2019-11-28 ENCOUNTER — Telehealth: Payer: Self-pay | Admitting: Pulmonary Disease

## 2019-11-28 NOTE — Telephone Encounter (Signed)
Patient is scheduled for a PFT for 11/29/2019. While patient was being called by Jerolyn Shin to assure no inhaler use prior to PFT. The patient informed her that he never received his prednisone that was prescribed at the last visit in 09/2019. Looking in the chart the pred rx was printed. Per AVS instructions the patient was to stay on the prednisone till PFT on 11/29/2019.   Dr. Craige Cotta please advise if you would like pt to still have PFT tomorrow or send in prednisone and reschedule his PFT.

## 2019-11-29 ENCOUNTER — Encounter: Payer: Self-pay | Admitting: Acute Care

## 2019-11-29 ENCOUNTER — Ambulatory Visit: Payer: Medicare Other | Admitting: Primary Care

## 2019-11-29 ENCOUNTER — Other Ambulatory Visit: Payer: Self-pay

## 2019-11-29 ENCOUNTER — Ambulatory Visit (INDEPENDENT_AMBULATORY_CARE_PROVIDER_SITE_OTHER): Payer: Medicare Other | Admitting: Acute Care

## 2019-11-29 ENCOUNTER — Ambulatory Visit (INDEPENDENT_AMBULATORY_CARE_PROVIDER_SITE_OTHER): Payer: Medicare Other | Admitting: Pulmonary Disease

## 2019-11-29 DIAGNOSIS — F1910 Other psychoactive substance abuse, uncomplicated: Secondary | ICD-10-CM

## 2019-11-29 DIAGNOSIS — J849 Interstitial pulmonary disease, unspecified: Secondary | ICD-10-CM | POA: Diagnosis not present

## 2019-11-29 LAB — PULMONARY FUNCTION TEST
DL/VA % pred: 135 %
DL/VA: 6.16 ml/min/mmHg/L
DLCO unc % pred: 79 %
DLCO unc: 20.77 ml/min/mmHg
FEF 25-75 Post: 2.33 L/sec
FEF 25-75 Pre: 1.75 L/sec
FEF2575-%Change-Post: 32 %
FEF2575-%Pred-Post: 73 %
FEF2575-%Pred-Pre: 55 %
FEV1-%Change-Post: 5 %
FEV1-%Pred-Post: 59 %
FEV1-%Pred-Pre: 56 %
FEV1-Post: 2.06 L
FEV1-Pre: 1.95 L
FEV1FVC-%Change-Post: 5 %
FEV1FVC-%Pred-Pre: 105 %
FEV6-%Change-Post: 0 %
FEV6-%Pred-Post: 55 %
FEV6-%Pred-Pre: 55 %
FEV6-Post: 2.36 L
FEV6-Pre: 2.37 L
FEV6FVC-%Change-Post: 0 %
FEV6FVC-%Pred-Post: 103 %
FEV6FVC-%Pred-Pre: 103 %
FVC-%Change-Post: 0 %
FVC-%Pred-Post: 53 %
FVC-%Pred-Pre: 53 %
FVC-Post: 2.36 L
FVC-Pre: 2.37 L
Post FEV1/FVC ratio: 87 %
Post FEV6/FVC ratio: 100 %
Pre FEV1/FVC ratio: 82 %
Pre FEV6/FVC Ratio: 100 %
RV % pred: 57 %
RV: 1.04 L
TLC % pred: 57 %
TLC: 3.55 L

## 2019-11-29 MED ORDER — PREDNISONE 10 MG PO TABS: ORAL_TABLET | ORAL | 0 refills | Status: AC

## 2019-11-29 NOTE — Progress Notes (Addendum)
History of Present Illness [name] is a 49 y.o. male with OSA on CPAP, Sjogren's syndrome, Raynaud's phenomenon, PE 2005, SLE, Insomnia, Back pain, Hyperhydrosis, HH, GERD, Depression, CKD, Avascular necrosis knees, Asthma, Anxiety, ADHD. He is followed by Dr. Halford Chessman.  Synopsis [name] is a 49 y.o. male with ILD in setting of SLE and Sjogren's syndrome and obstructive sleep apnea.  Had prior UPPP.  He had CT chest in October.  This Showed UIP pattern .  His lab work showed elevated ANA, SSA, and SM Ab.    He has noticed getting winded more easily.  Has a dry cough.  Not having fever, chest pain, or leg swelling.    11/29/2019 Pt. Presents for follow up after PFT's . He has had worsening dyspnea over the last several months and has been concerned that he has had progression of his ILD. PFT's are relatively stable. He is in no distress on exam.   Test Results: PFT 07/08/07 >> FEV1 2.02 (52%), FEV1% 81, TLC 3.25 (53%), DLCO 73% PFT 06/13/10 >> FEV1 2.12 (54%), FEV1% 83, TLC 3.67 (58%), DLCO 70% PFT 11/29/2019>>FEV1 1.95 ( 56%), FEV1% 87, TLC 3.55( 57%) DLCO 79% Serology:  05/2204 >> RF 31, ANCA negative, ANA 1:2560 speckled, anti DS DNA 68, anti Jo negative, anti SM positive, anti Ro/La positive, aldolase 3.1 09/15/19 >> ANCA negative, RF < 14, ANA positive, SM Ab > 8, SSA > 8, SCL 70 negative, SSB negative, dsDNA negative, aldolase 4.5, ESR 28  Chest imaging:  CT chest 01/05/03 >> RLL PE CT chest 04/29/06 >> bibasilar linear scarring and slight subpleural honeycombing CT chest 07/08/07 >> mild reticulation and architectural distortion within subpleural and basilar distribution with minimal fibrosis, small HH CT chest 09/22/19 >> extensive septal thickening, subpleural reticulation, cylindrical BTX with honeycombing  Sleep tests:  HST 12/30/16 >> AHI 28.4, SaO2 low 49% Auto CPAP 09/19/19 to 10/18/19 >> used on 29 of 30 nights with average 5 hrs 26 min.  Average AHI 1.5  with median CPAP 10 and 95 th percentile CPAP 15 cm H2O  CBC Latest Ref Rng & Units 09/15/2019 11/10/2013 11/09/2013  WBC 4.0 - 10.5 K/uL 6.1 7.9 9.7  Hemoglobin 13.0 - 17.0 g/dL 15.7 16.9 18.7(H)  Hematocrit 39.0 - 52.0 % 47.1 50.1 56.4(H)  Platelets 150.0 - 400.0 K/uL 190.0 132(L) 158    BMP Latest Ref Rng & Units 09/15/2019 11/09/2013 05/05/2012  Glucose 70 - 99 mg/dL 84 94 87  BUN 6 - 23 mg/dL '10 17 16  ' Creatinine 0.40 - 1.50 mg/dL 1.19 1.20 1.3  Sodium 135 - 145 mEq/L 136 139 134(L)  Potassium 3.5 - 5.1 mEq/L 4.1 4.0 4.0  Chloride 96 - 112 mEq/L 100 100 96  CO2 19 - 32 mEq/L 33(H) 31 30  Calcium 8.4 - 10.5 mg/dL 9.0 9.4 8.0(L)    BNP No results found for: BNP  ProBNP No results found for: PROBNP  PFT    Component Value Date/Time   FEV1PRE 1.95 11/29/2019 1207   FEV1POST 2.06 11/29/2019 1207   FVCPRE 2.37 11/29/2019 1207   FVCPOST 2.36 11/29/2019 1207   TLC 3.55 11/29/2019 1207   DLCOUNC 20.77 11/29/2019 1207   PREFEV1FVCRT 82 11/29/2019 1207   PSTFEV1FVCRT 87 11/29/2019 1207    No results found.   Past medical hx Past Medical History:  Diagnosis Date  . ADHD (attention deficit hyperactivity disorder)   . Anxiety   . Asthma    early adulthood/ exercise induced  .  Avascular necrosis (HCC)    of knees from long term steroid use  . Bone pain    chronic  . Chronic kidney disease    just 1 kidney congenital  . Depression   . Esophageal ulcer   . GERD (gastroesophageal reflux disease)   . Hiatal hernia   . Hyperhydrosis disorder   . Insomnia   . Low back pain   . Lupus (systemic lupus erythematosus) (HCC)    Dr. Close at [facility]  . PE (pulmonary embolism)    hx  . Pericarditis    resolved  . Pulmonary fibrosis ([place])   . Raynaud's disease   . Sjogren's syndrome ([place])   . Sleep apnea      Social History   Tobacco Use  . Smoking status: Never Smoker  . Smokeless tobacco: Never Used  Substance Use Topics  . Alcohol use: Yes     Alcohol/week: 0.0 standard drinks    Comment: rare  . Drug use: No    Mr.Gahan reports that he has never smoked. He has never used smokeless tobacco. He reports current alcohol use. He reports that he does not use drugs.  Tobacco Cessation: Never smoker  Past surgical hx, Family hx, Social hx all reviewed.  Current Outpatient Medications on File Prior to Visit  Medication Sig  . ALPRAZolam (XANAX) 1 MG tablet Take 1 tablet (1 mg total) by mouth 3 (three) times daily as needed for anxiety. (Patient taking differently: Take 1 mg by mouth 2 (two) times daily as needed for anxiety. 1/2 tablet)  . amphetamine-dextroamphetamine (ADDERALL) 30 MG tablet Take 1 tablet by mouth 3 (three) times daily.  . ciprofloxacin (CIPRO) 500 MG tablet Take 1 tablet (500 mg total) by mouth 2 (two) times daily.  . fluconazole (DIFLUCAN) 150 MG tablet Take 150 mg by mouth daily.  Marland Kitchen HYDROmorphone HCl (DILAUDID IJ) Inject as directed. By means of continuous infusion pump 0.3 mg/ml  . omeprazole (PRILOSEC) 40 MG capsule TAKE 1 CAPSULE(40 MG) BY MOUTH TWICE DAILY BEFORE A MEAL  . predniSONE (DELTASONE) 10 MG tablet Take 3 tablets (30 mg total) by mouth daily with breakfast for 7 days, THEN 2 tablets (20 mg total) daily with breakfast for 7 days, THEN 1 tablet (10 mg total) daily with breakfast for 7 days, THEN 0.5 tablets (5 mg total) daily with breakfast for 22 days.  . SYRINGE-NEEDLE, DISP, 3 ML (B-D SYRINGE/NEEDLE 3CC/23GX1") 23G X 1" 3 ML MISC Use for an intramuscular injection as directed  . testosterone cypionate (DEPOTESTOSTERONE CYPIONATE) 200 MG/ML injection INJECT 1 ML IN THE MUSCLE EVERY 2 WEEKS (Patient taking differently: Inject 200 mg into the muscle once a week. )  . zolpidem (AMBIEN) 10 MG tablet TAKE ONE TABLET BY MOUTH EVERY NIGHT AT BEDTIME AS NEEDED FOR SLEEP   Current Facility-Administered Medications on File Prior to Visit  Medication  . 0.9 %  sodium chloride infusion  . 0.9 %  sodium chloride  infusion     Allergies  Allergen Reactions  . Armoracia Rusticana Ext (Horseradish) Anaphylaxis, Hives and Shortness Of Breath  . Other Hives, Itching and Nausea And Vomiting    -Cocktail Sauce -(d)-limonene Flavor  . Morphine And Related Nausea And Vomiting  . Trimethoprim Other (See Comments)    Kidney Toxicity Kidney Toxicity   . Rocephin [Ceftriaxone Sodium In Dextrose]   . Sulfamethoxazole     REACTION: rash  . Sulfamethoxazole-Trimethoprim     REACTION: unspecified    Review Of Systems:  Constitutional:   No  weight loss, night sweats,  Fevers, chills, fatigue, or  lassitude.  HEENT:   No headaches,  Difficulty swallowing,  Tooth/dental problems, or  Sore throat,                No sneezing, itching, ear ache, nasal congestion, post nasal drip,   CV:  No chest pain,  Orthopnea, PND, swelling in lower extremities, anasarca, dizziness, palpitations, syncope.   GI  No heartburn, indigestion, abdominal pain, nausea, vomiting, diarrhea, change in bowel habits, loss of appetite, bloody stools.   Resp: +  shortness of breath with exertion or at rest.  No excess mucus, no productive cough,  No non-productive cough,  No coughing up of blood.  No change in color of mucus.  No wheezing.  No chest wall deformity  Skin: no rash or lesions.  GU: no dysuria, change in color of urine, no urgency or frequency.  No flank pain, no hematuria   MS:  No joint pain or swelling.  No decreased range of motion.  No back pain.  Psych:  No change in mood or affect. No depression or anxiety.  No memory loss.   Vital Signs BP 120/66 (BP Location: Left Arm, Cuff Size: Normal)   Pulse 93   Ht '5\' 6"'  (1.676 m)   Wt 192 lb (87.1 kg)   SpO2 97%   BMI 30.99 kg/m    Physical Exam:  General- No distress,  A&Ox3, pleasant ENT: No sinus tenderness, TM clear, pale nasal mucosa, no oral exudate,no post nasal drip, no LAN Cardiac: S1, S2, regular rate and rhythm, no murmur Chest: No wheeze/ +  crackles per bases/ no dullness; no accessory muscle use, no nasal flaring, no sternal retractions Abd.: Soft Non-tender, ND, BS +, Body mass index is 30.99 kg/m. Ext: No clubbing cyanosis, edema Neuro:  normal strength, MAE x 4, A&O x 3 Skin: No rashes, warm and dry, no lesions Psych: normal mood and behavior, slightly anxious   Assessment/Plan Obstructive sleep apnea. - he is compliant with CPAP and reports benefit - he is reporting more trouble breathing at night; he might have hypoventilation/hypoxia in setting of ILD with OSA - will continue auto CPAP for now - will arrange for overnight oximetry with CPAP; if he has oxygen desaturation, he would then need an in lab titration study - Review of medications >> several sedating medications noted. ( Xanax, Dilaudid,Ambien)>> Need to see if we can decrease these doses if possible  ILD with hx of SLE and Sjogren's syndrome. - he has progression of his symptoms - serology positive for SM AB, SSA, and ANA - HRCT chest with UIP pattern and shows progression of disease - PFT's are stable - will start him on prednisone for now - will arrange for referral back to [facility] rheumatology (last seen there in May 2016 with Dr. Lattie Haw Criscione-Schreiber)  Deconditioning - Consider deconditioning as possible reason for worsening shortness of breath as PFT's appear stable - Follow up with cards as often times shortness of breath can also be a cardiac issue - Referral to Pulmonary Rehab - Review sedating medications as they may be contributing to lethargy  Patient Instructions  Will have your start prednisone with tapering dose down to 5 mg daily, and stay on 5 mg prednisone daily until your appointment in January  Will arrange for overnight oxygen test with CPAP  Follow up with [facility] rheumatology as scheduled 02/07/2020  Consider follow up with cardiology  Try  to minimize  Sedating medications that may be contributing to lethargy  Follow  up with Dr. Halford Chessman in 1 month   Addendum: 12/06/2019 While completing/ updating  charting I noted an ED Admission 12/01/2019 at [facility]. Pt.  was seen  for  Hallucinations after Meth use on 11/28/2019. Additionally he has a pain pump per his RLQ (Dilaudid) that was prescribed  for bone pain. He also has prescriptions for  Xanax and Ambien.  We do not have any notation of substance abuse in the [facility] records. I will add this to patient's problem list today. I will also notify the patient's PCP to make sure he is aware as he is refilling several of these prescriptions.  Methamphetamines, Cocaine  Comment: last drug use 07/20/2019    I provided 35 minutes of care    Magdalen Spatz, NP 11/29/2019  2:34 PM

## 2019-11-29 NOTE — Telephone Encounter (Signed)
Okay to proceed with doing PFT.  Based on results will decide if he needs to resume prednisone.

## 2019-11-29 NOTE — Telephone Encounter (Signed)
I called the patient and made him aware. Patient will be coming in for the PFT scheduled today at 1:00. Nothing further needed at this time.

## 2019-11-29 NOTE — Patient Instructions (Addendum)
Your PFT's are relatively stable. Your DLCO is actually better than it has been in the past. We will start a prednisone taper.  We will send in a prednisone taper  Take 3 tablets (30 mg total) by mouth daily with breakfast for 7 days, THEN 2 tablets (20 mg total) daily with breakfast for 7 days, THEN 1 tablet (10 mg total) daily with breakfast for 7 days, THEN 0.5 tablets (5 mg total) daily with breakfast for 22 days. Follow up with [facility] rheumatology (last seen there in May 2016 with Dr. Misty Stanley Criscione-Schreiber) Scheduled for 02/07/2020 Follow up with Dr. Rory Percy NP, or Waynetta Sandy NP in 1 month to assess how you are doing on your prednisone. Continue on CPAP at bedtime. You appear to be benefiting from the treatment  Goal is to wear for at least 6 hours each night for maximal clinical benefit. Continue to work on weight loss, as the link between excess weight  and sleep apnea is well established.   Remember to establish a good bedtime routine, and work on sleep hygiene.  Limit daytime naps , avoid stimulants such as caffeine and nicotine close to bedtime, exercise daily to promote sleep quality, avoid heavy , spicy, fried , or rich foods before bed. Ensure adequate exposure to natural light during the day,establish a relaxing bedtime routine with a pleasant sleep environment ( Bedroom between 60 and 67 degrees, turn off bright lights , TV or device screens screens , consider black out curtains or white noise machines) Do not drive if sleepy. Remember to clean mask, tubing, filter, and reservoir once weekly with soapy water.  Follow up with Dr. Craige Cotta   In 1  or before as needed.   Please contact office for sooner follow up if symptoms do not improve or worsen or seek emergency care

## 2019-11-29 NOTE — Progress Notes (Signed)
PFT done today. 

## 2019-12-01 ENCOUNTER — Ambulatory Visit: Payer: Medicare Other | Admitting: Primary Care

## 2019-12-06 ENCOUNTER — Encounter: Payer: Self-pay | Admitting: Acute Care

## 2019-12-06 DIAGNOSIS — F1910 Other psychoactive substance abuse, uncomplicated: Secondary | ICD-10-CM | POA: Insufficient documentation

## 2019-12-06 HISTORY — DX: Other psychoactive substance abuse, uncomplicated: F19.10

## 2019-12-06 NOTE — Progress Notes (Signed)
Reviewed and agree with assessment/plan.   Damiel Barthold, MD [place] [facility] 11/18/2016, 12:24 PM Pager:  [phone]  

## 2019-12-06 NOTE — Assessment & Plan Note (Signed)
Hallucinations after meth ingestion 11/28/2019 Plan Will add substance abuse to problem list today. Will Notify PCP

## 2019-12-20 ENCOUNTER — Telehealth: Payer: Self-pay | Admitting: *Deleted

## 2019-12-20 MED ORDER — AMPHETAMINE-DEXTROAMPHETAMINE 30 MG PO TABS: 30.0000 mg | ORAL_TABLET | Freq: Three times a day (TID) | ORAL | 0 refills | Status: DC

## 2019-12-20 NOTE — Telephone Encounter (Signed)
Patient called he would like a refill on Adderall. No OV since 07/28/2019

## 2019-12-20 NOTE — Telephone Encounter (Signed)
Patient is calling back in regards to his adderall  [id number] - [place], [place] - [address] AT [facility] [address] Phone:  [phone]  Fax:  [phone]

## 2019-12-20 NOTE — Telephone Encounter (Signed)
Done

## 2019-12-20 NOTE — Telephone Encounter (Signed)
Patient is aware 

## 2020-01-01 ENCOUNTER — Ambulatory Visit: Payer: Medicare Other | Admitting: Acute Care

## 2020-01-03 ENCOUNTER — Encounter: Payer: Self-pay | Admitting: Acute Care

## 2020-01-03 ENCOUNTER — Other Ambulatory Visit: Payer: Self-pay

## 2020-01-03 ENCOUNTER — Ambulatory Visit (INDEPENDENT_AMBULATORY_CARE_PROVIDER_SITE_OTHER): Payer: Medicare Other | Admitting: Acute Care

## 2020-01-03 VITALS — BP 120/68 | HR 92 | Temp 97.4°F | Ht 67.0 in | Wt 192.0 lb

## 2020-01-03 DIAGNOSIS — Z9989 Dependence on other enabling machines and devices: Secondary | ICD-10-CM

## 2020-01-03 DIAGNOSIS — R0600 Dyspnea, unspecified: Secondary | ICD-10-CM

## 2020-01-03 DIAGNOSIS — G4733 Obstructive sleep apnea (adult) (pediatric): Secondary | ICD-10-CM

## 2020-01-03 DIAGNOSIS — J984 Other disorders of lung: Secondary | ICD-10-CM | POA: Diagnosis not present

## 2020-01-03 DIAGNOSIS — R0609 Other forms of dyspnea: Secondary | ICD-10-CM

## 2020-01-03 MED ORDER — BREO ELLIPTA 100-25 MCG/INH IN AEPB: 1.0000 | INHALATION_SPRAY | Freq: Every day | RESPIRATORY_TRACT | 0 refills | Status: DC

## 2020-01-03 NOTE — Progress Notes (Signed)
Virtual Visit via Video Note  I connected with [name] on 01/03/20 at  2:30 PM EST by a video enabled telemedicine application and verified that I am speaking with the correct person using two identifiers.  Location: Patient: In his car, location unknown. Provider: [place], [place], [place], [address]  [name] is a 49 y.o. male with OSA on CPAP,  Sjogren's syndrome, Raynaud's phenomenon, PE 2005, SLE, Insomnia, Back pain, Hyperhydrosis, HH, GERD, Depression, CKD, Avascular necrosis knees, Asthma, Anxiety, ADHD. He is followed by Dr. Halford Chessman.    I discussed the limitations of evaluation and management by telemedicine and the availability of in person appointments. The patient expressed understanding and agreed to proceed.  History of Present Illness: Pt. Presents for follow up. He presented for an OV, but we were running behind, and the patient asked to have the visit converted to a televisit. He states he  has no complaints regarding his CPAP machine other than the fact he feels the pressures are set too high.  Upon review of his download, median pressure is 9.6.He is currently set at 5-20 cm H2O.   Otherwise AHI is well-controlled, and patient denies any daytime sleepiness or headaches. Patient's primary complaint today is continued exertional dyspnea.  He feels this is worse when he exercises.  He states the symptoms come on quickly and are hard to resolve.  He is requesting a therapeutic drug trial.  We have reviewed medications and we will do a therapeutic drug trial with Breo.  The patient does have a history of asthma in the past and feels he possibly has exercise-induced asthma at present.  Additionally he is requesting an albuterol inhaler, as he has not had one recently and he feels it would benefit his shortness of breath with exertion.  He denies any fever, chest pain, orthopnea, or hemoptysis.  He states he is practicing careful Covid precautions when  he is out in public as he knows he is at increased risk with his pulmonary disease.     Observations/Objective: Down Load  Air sense 10 AutoSet Minimum pressure 5 cm H2O Maximum pressure 20 cm H2O Usage 26 of 30 days or 87% Greater than 4 hours 15 days Less than 4 hours 11 days Average usage days used 5 hours and 8 minutes AHI 2.6 Median pressure used 9.6    Assessment and Plan: Restriction per PFT's>> No significant BD response Dyspnea with exertion Plan We will do a therapeutic drug trial with Breo to see if this helps your shortness of breath Inhale one puff once daily Rinse mouth after use. Follow up tele visit 01/17/2020 at 2 pm to evaluate effectiveness. We will send in a prescription for an albuterol inhaler. Use as needed for breakthrough shortness of breath or wheezing, no more than 4 times daily.  OSA on CPAP  AHI on Down Load is 2.8 Median pressure is 9.6 Plan Continue to use your CPAP at night with bed.  We will Decrease pressures to 5-15 cm pressure We will need a down Load at your 2/24/appointment at 2 pm Continue on CPAP at bedtime. You appear to be benefiting from the treatment  Goal is to wear for at least 6 hours each night for maximal clinical benefit. Continue to work on weight loss, as the link between excess weight  and sleep apnea is well established.   Remember to establish a good bedtime routine, and work on sleep hygiene.  Limit daytime naps ,  avoid stimulants such as caffeine and nicotine close to bedtime, exercise daily to promote sleep quality, avoid heavy , spicy, fried , or rich foods before bed. Ensure adequate exposure to natural light during the day,establish a relaxing bedtime routine with a pleasant sleep environment ( Bedroom between 60 and 67 degrees, turn off bright lights , TV or device screens screens , consider black out curtains or white noise machines) Do not drive if sleepy. Remember to clean mask, tubing, filter, and reservoir  once weekly with soapy water.  Follow up with Dr. Craige Cotta   In 3 or before as needed.     Follow Up Instructions: Telephone visit in 2 weeks 01/17/2020 at 2 pm.    I discussed the assessment and treatment plan with the patient. The patient was provided an opportunity to ask questions and all were answered. The patient agreed with the plan and demonstrated an understanding of the instructions.   The patient was advised to call back or seek an in-person evaluation if the symptoms worsen or if the condition fails to improve as anticipated.  I provided 30 minutes of non-face-to-face time during this encounter.   Bevelyn Ngo, NP 01/03/2020 4:16 PM

## 2020-01-03 NOTE — Patient Instructions (Addendum)
It was good to talk with you today. We will do a therapeutic drug trial with Breo Inhale one puff once daily Rinse mouth after use. Follow up tele visit 01/17/2020 at 2 pm to evaluate effectiveness. We will send in a prescription for an albuterol inhaler. Use as needed for breakthrough shortness of breath or wheezing, no more than 4 times daily.   Continue to use your CPAP at night with bed.  We will Decrease pressures to 5-15 cm pressure We will need a down Load at your 2/24/appointment at 2 pm Continue on CPAP at bedtime. You appear to be benefiting from the treatment  Goal is to wear for at least 6 hours each night for maximal clinical benefit. Continue to work on weight loss, as the link between excess weight  and sleep apnea is well established.   Remember to establish a good bedtime routine, and work on sleep hygiene.  Limit daytime naps , avoid stimulants such as caffeine and nicotine close to bedtime, exercise daily to promote sleep quality, avoid heavy , spicy, fried , or rich foods before bed. Ensure adequate exposure to natural light during the day,establish a relaxing bedtime routine with a pleasant sleep environment ( Bedroom between 60 and 67 degrees, turn off bright lights , TV or device screens screens , consider black out curtains or white noise machines) Do not drive if sleepy. Remember to clean mask, tubing, filter, and reservoir once weekly with soapy water.  Follow up with Dr. Craige Cotta   In 3 or before as needed.

## 2020-01-04 NOTE — Progress Notes (Signed)
Reviewed and agree with assessment/plan.   Ray Gervasi, MD [place] [facility] 11/18/2016, 12:24 PM Pager:  [phone]  

## 2020-01-17 ENCOUNTER — Encounter: Payer: Self-pay | Admitting: Acute Care

## 2020-01-17 ENCOUNTER — Other Ambulatory Visit: Payer: Self-pay

## 2020-01-17 ENCOUNTER — Ambulatory Visit (INDEPENDENT_AMBULATORY_CARE_PROVIDER_SITE_OTHER): Payer: Medicare Other | Admitting: Acute Care

## 2020-01-17 DIAGNOSIS — M3502 Sicca syndrome with lung involvement: Secondary | ICD-10-CM

## 2020-01-17 DIAGNOSIS — M329 Systemic lupus erythematosus, unspecified: Secondary | ICD-10-CM

## 2020-01-17 DIAGNOSIS — G4733 Obstructive sleep apnea (adult) (pediatric): Secondary | ICD-10-CM | POA: Diagnosis not present

## 2020-01-17 DIAGNOSIS — Z9989 Dependence on other enabling machines and devices: Secondary | ICD-10-CM

## 2020-01-17 DIAGNOSIS — R0600 Dyspnea, unspecified: Secondary | ICD-10-CM

## 2020-01-17 NOTE — Progress Notes (Signed)
Reviewed and agree with assessment/plan.   [name], MD [place] [facility] 11/18/2016, 12:24 PM Pager:  [phone]  

## 2020-01-17 NOTE — Progress Notes (Signed)
Virtual Visit via Video Note  I connected with [name] on 01/17/20 at  2:00 PM EST by a video enabled telemedicine application and verified that I am speaking with the correct person using two identifiers.  Location: Patient: At home Provider: [address], [place], [place], [address]    I discussed the limitations of evaluation and management by telemedicine and the availability of in person appointments. The patient expressed understanding and agreed to proceed.  [name] is a 49 y.o. male with OSA on CPAP, Sjogren's syndrome, Raynaud's phenomenon, PE 2005, SLE, Insomnia, Back pain, Hyperhydrosis, HH, GERD, Depression, CKD, Avascular necrosis knees, Asthma, Anxiety, ADHD. He is followed by Dr. Halford Chessman.  History of Present Illness: Pt. Presents for follow up. He was seen 01/03/2020 for exertional dyspnea.He has a history of asthma in the past.  We did a therapeutic trial of Breo. He did not want to resume systemic prednisone therapy unless it was absolutely necessary.  Additionally we prescribed an albuterol inhaler for breakthrough shortness of breath which he experiences primarily when exercising.He is meeting virtually today to evaluate his response to the [facility].   Pt. States he has been doing well. He feels the Memory [facility] does help him, however he cannot afford it. He uses [facility] for his medications.    Observations/Objective: 11/29/2019 PFT's Results for [name], [name] (MRN [id number]) as of 01/17/2020 11:16  Ref. Range 11/29/2019 12:07  FVC-Pre Latest Units: L 2.37  FVC-%Pred-Pre Latest Units: % 53  FEV1-Pre Latest Units: L 1.95  FEV1-%Pred-Pre Latest Units: % 56  Pre FEV1/FVC ratio Latest Units: % 82  FEV1FVC-%Pred-Pre Latest Units: % 105  FEF 25-75 Pre Latest Units: L/sec 1.75  FEF2575-%Pred-Pre Latest Units: % 55  FEV6-Pre Latest Units: L 2.37  FEV6-%Pred-Pre Latest Units: % 55  Pre FEV6/FVC Ratio Latest Units: % 100  FEV6FVC-%Pred-Pre Latest Units:  % 103  FVC-Post Latest Units: L 2.36  FVC-%Pred-Post Latest Units: % 53  FVC-%Change-Post Latest Units: % 0  FEV1-Post Latest Units: L 2.06  FEV1-%Pred-Post Latest Units: % 59  FEV1-%Change-Post Latest Units: % 5  Post FEV1/FVC ratio Latest Units: % 87  FEV1FVC-%Change-Post Latest Units: % 5  FEF 25-75 Post Latest Units: L/sec 2.33  FEF2575-%Pred-Post Latest Units: % 73  FEF2575-%Change-Post Latest Units: % 32  FEV6-Post Latest Units: L 2.36  FEV6-%Pred-Post Latest Units: % 55  FEV6-%Change-Post Latest Units: % 0  Post FEV6/FVC ratio Latest Units: % 100  FEV6FVC-%Pred-Post Latest Units: % 103  FEV6FVC-%Change-Post Latest Units: % 0  TLC Latest Units: L 3.55  TLC % pred Latest Units: % 57  RV Latest Units: L 1.04  RV % pred Latest Units: % 57  DLCO unc Latest Units: ml/min/mmHg 20.77  DLCO unc % pred Latest Units: % 79  DL/VA Latest Units: ml/min/mmHg/L 6.16  DL/VA % pred Latest Units: % 135    Spirometry 11/2010  is suggestive of restrictive defect. Moderate restriction confirmed on lung volume testing. Mild diffusion defect. No significant bronchodilator response.  PFT 07/08/07 >> FEV1 2.02 (52%), FEV1% 81, TLC 3.25 (53%), DLCO 73% PFT 06/13/10 >> FEV1 2.12 (54%), FEV1% 83, TLC 3.67 (58%), DLCO 70%  Serology:  05/2204 >> RF 31, ANCA negative, ANA 1:2560 speckled, anti DS DNA 68, anti Jo negative, anti SM positive, anti Ro/La positive, aldolase 3.1 09/15/19 >> ANCA negative, RF < 14, ANA positive, SM Ab > 8, SSA > 8, SCL 70 negative, SSB negative, dsDNA negative, aldolase 4.5, ESR 28  Chest imaging:  CT chest 01/05/03 >> RLL PE CT chest 04/29/06 >> bibasilar linear scarring and slight subpleural honeycombing CT chest 07/08/07 >> mild reticulation and architectural distortion within subpleural and basilar distribution with minimal fibrosis, small HH CT chest 09/22/19 >> extensive septal thickening, subpleural reticulation, cylindrical BTX with honeycombing  HST 12/30/16 >> AHI  28.4, SaO2 low 49% Auto CPAP 09/19/19 to 10/18/19 >> used on 29 of 30 nights with average 5 hrs 26 min.  Average AHI 1.5 with median CPAP 10 and 95 th percentile CPAP 15 cm H2O.  Assessment and Plan: Dyspnea on exertion Therapeutic Drug Trial Breo 01/03/2020 Did have benefit, but he cannot afford the medication.  Plan We will send you the paperwork for financial assistance for Breo. Please check pricing on Symbicort , Advair and Dulera through [place]  to see if they are better priced for use.  If you are able to get Breo at an affordable price, please take 1 puff once daily. Rinse mouth after use. We will send in a prescription for Ventolin ( Albuterol) This is a rescue inhaler. Use as needed for extreme shortness of breath or wheezing up to twice daily. If you exceed needing 2 doses daily, please call the office to be seen.    ILD with Hx of SLE and Sjogren's syndrome Recent progression of symptoms serology positive for SM AB, SSA, and ANA - HRCT chest with UIP pattern and shows progression of disease - Slow prednisone taper  Over 2 months starting 10/23/2019 - Declined further prednisone at last OV  - Therapeutic Trial  with [place] Referral back to [facility] rheumatology (last seen there in May 2016 with Dr. Lattie Haw Criscione-Schreiber) Appointment is set up for 01/2020.    OSA Compliant with therapy Plan Continue on CPAP at bedtime. You appear to be benefiting from the treatment  Goal is to wear for at least 6 hours each night for maximal clinical benefit. Continue to work on weight loss, as the link between excess weight  and sleep apnea is well established.   Remember to establish a good bedtime routine, and work on sleep hygiene.  Limit daytime naps , avoid stimulants such as caffeine and nicotine close to bedtime, exercise daily to promote sleep quality, avoid heavy , spicy, fried , or rich foods before bed. Ensure adequate exposure to natural light during the day,establish a  relaxing bedtime routine with a pleasant sleep environment ( Bedroom between 60 and 67 degrees, turn off bright lights , TV or device screens screens , consider black out curtains or white noise machines) Do not drive if sleepy. Remember to clean mask, tubing, filter, and reservoir once weekly with soapy water.  Follow up with Dr. Halford Chessman or Judson Roch NP   In 6 months  or before as needed.    Follow Up Instructions: Follow up with Judson Roch NP or Dr. Halford Chessman in 6 months   I discussed the assessment and treatment plan with the patient. The patient was provided an opportunity to ask questions and all were answered. The patient agreed with the plan and demonstrated an understanding of the instructions.   The patient was advised to call back or seek an in-person evaluation if the symptoms worsen or if the condition fails to improve as anticipated.  I provided 25  minutes of non-face-to-face time during this encounter.   Magdalen Spatz, NP 01/17/2020 2:33 PM

## 2020-01-17 NOTE — Patient Instructions (Signed)
It is good to talk with you today. I am glad the Breo improved your shortness of breath. Im sorry to hear it is too expensive.  We will send you the paperwork for financial assistance for Breo. Please check pricing on Symbicort , Advair and Dulera to see if they are better priced for use.  If you are able to get Breo at an affordable price, please take 1 puff once daily. Rinse mouth after use. We will send in a prescription for Ventolin ( Albuterol) This is a rescue inhaler. Use as needed for extreme shortness of breath or wheezing up to twice daily. If you exceed needing 2 doses daily, please call the office to be seen.  Follow up with  Dr. Misty Stanley Criscione-Schreiber at [facility] as is scheduled 01/2020. You CPAP compliance looks great.  Continue on CPAP at bedtime. You appear to be benefiting from the treatment  Goal is to wear for at least 6 hours each night for maximal clinical benefit. Continue to work on weight loss, as the link between excess weight  and sleep apnea is well established.   Remember to establish a good bedtime routine, and work on sleep hygiene.  Limit daytime naps , avoid stimulants such as caffeine and nicotine close to bedtime, exercise daily to promote sleep quality, avoid heavy , spicy, fried , or rich foods before bed. Ensure adequate exposure to natural light during the day,establish a relaxing bedtime routine with a pleasant sleep environment ( Bedroom between 60 and 67 degrees, turn off bright lights , TV or device screens screens , consider black out curtains or white noise machines) Do not drive if sleepy. Remember to clean mask, tubing, filter, and reservoir once weekly with soapy water.  Follow up with Dr. Craige Cotta or Maralyn Sago NP   In 6 months  or before as needed.

## 2020-01-24 ENCOUNTER — Telehealth: Payer: Self-pay | Admitting: Gastroenterology

## 2020-01-30 ENCOUNTER — Other Ambulatory Visit: Payer: Self-pay

## 2020-01-30 DIAGNOSIS — K2100 Gastro-esophageal reflux disease with esophagitis, without bleeding: Secondary | ICD-10-CM

## 2020-01-30 DIAGNOSIS — K449 Diaphragmatic hernia without obstruction or gangrene: Secondary | ICD-10-CM

## 2020-01-30 DIAGNOSIS — Z978 Presence of other specified devices: Secondary | ICD-10-CM

## 2020-01-30 DIAGNOSIS — R1310 Dysphagia, unspecified: Secondary | ICD-10-CM

## 2020-01-30 HISTORY — DX: Presence of other specified devices: Z97.8

## 2020-01-30 NOTE — Telephone Encounter (Signed)
Called and spoke with patient-patient has been scheduled for TIF procedure at [facility] on 02/19/2020 at 10:30 am; patient has also been scheduled for COVID screening on 02/15/2020 at 1:45 pm; amb ref has already been placed in Epic; hospital orders have been placed in Epic, along with COVID order; instructions have been sent to patient via MyChart and also mailed to the patient; Patient advised to call back to the office at [phone] should questions/concerns arise;  Patient verbalized understanding of information/instructions;

## 2020-02-07 DIAGNOSIS — M8700 Idiopathic aseptic necrosis of unspecified bone: Secondary | ICD-10-CM

## 2020-02-07 DIAGNOSIS — Q600 Renal agenesis, unilateral: Secondary | ICD-10-CM

## 2020-02-07 HISTORY — DX: Renal agenesis, unilateral: Q60.0

## 2020-02-07 HISTORY — DX: Idiopathic aseptic necrosis of unspecified bone: M87.00

## 2020-02-15 ENCOUNTER — Other Ambulatory Visit (HOSPITAL_COMMUNITY): Payer: Medicare Other

## 2020-02-16 ENCOUNTER — Other Ambulatory Visit (HOSPITAL_COMMUNITY)
Admission: RE | Admit: 2020-02-16 | Discharge: 2020-02-16 | Disposition: A | Discharge status: Home / Self Care | Payer: Medicare Other | Source: Ambulatory Visit | Attending: Gastroenterology | Admitting: Gastroenterology

## 2020-02-16 DIAGNOSIS — Z20822 Contact with and (suspected) exposure to covid-19: Secondary | ICD-10-CM | POA: Insufficient documentation

## 2020-02-16 DIAGNOSIS — Z01812 Encounter for preprocedural laboratory examination: Secondary | ICD-10-CM | POA: Insufficient documentation

## 2020-02-16 LAB — SARS CORONAVIRUS 2 (TAT 6-24 HRS): SARS Coronavirus 2: NEGATIVE

## 2020-02-18 ENCOUNTER — Telehealth: Payer: Self-pay | Admitting: Gastroenterology

## 2020-02-18 NOTE — Telephone Encounter (Signed)
Chart reviewed prior to scheduled TIF tomorrow and notable for recent ER evaluation at [facility] for hallucinations related to meth use. UDS + for amphetamines and cocaine. He follows with Pain Management at [facility] with intrathecal pain system. Those notes reviewed as well.   I spoke with the patient at length today. Concerns regarding recent drug use and elective antireflux surgery, with elevated concerns for disruption of wrap integrity if any active drug use or withdrawal if recent use. He says he hasnt used recently and follows with a Veterinary surgeon. I cannot find these records in EMR today, to include no record of repeat UDS since the ER eval in 11/2019. Given recent events and elevated concerns, will cancel tomorrow's procedure in favor of establishing documented drug use sobriety x6 months, then can plan on TIF. Additionally, given duration of time since last EGD (delays related to Covid), will likely plan on repeat EGD prior to TIF to ensure no significant change in Hill grade of valve.   I explained this in detail, and he understands and agrees to the plan. He was appreciative of the weekend call prior to the procedure.   Sherrye Payor, Please coordinate for discussion with his Pain Management clinic so that we can ensure he has had negative UDS studies and regular counseling x6 months prior to scheduling again for TIF. Plan for f/u appt in office in 3 months and EGD in 3-4 months (in [facility]) as well. Thanks.

## 2020-02-19 ENCOUNTER — Encounter (HOSPITAL_COMMUNITY): Payer: Self-pay | Admitting: Certified Registered"

## 2020-02-19 ENCOUNTER — Encounter (HOSPITAL_COMMUNITY): Admission: RE | Payer: Self-pay | Source: Home / Self Care

## 2020-02-19 ENCOUNTER — Ambulatory Visit (HOSPITAL_COMMUNITY): Admission: RE | Admit: 2020-02-19 | Payer: Medicare Other | Source: Home / Self Care | Admitting: Gastroenterology

## 2020-02-19 SURGERY — ESOPHAGOGASTRODUODENOSCOPY (EGD) WITH PROPOFOL
Anesthesia: General

## 2020-02-22 NOTE — Telephone Encounter (Signed)
Spoke with Judeth Cornfield at the pain clinic who was informed of patients recent drug use and antireflux surgery that has been cancelled. We are trying to coordinate a discussion with Dr Barrie Dunker to ensure that patient has had negative drug screening studies and regular counseling for 6 months prior to scheduling again for TIF. I have phoned the Pain Clinic twice with this information but have not heard back at this time as far a putting a plan in place. Will continue to reach out to the office.

## 2020-02-28 NOTE — Telephone Encounter (Signed)
Dr Barron Alvine, Dr Roderic Ovens from the Pain Clinic would like to speak with you regarding the patient's plan to ensure he follows up with regular counseling and drug screening studies for 6 months. He is available to have this discussion with you on 03/01/20 at 12:30 PM. Please let me know if this time is not available for you and I will contact the office with a different date and time.  Wyline Beady

## 2020-02-29 ENCOUNTER — Telehealth: Payer: Self-pay | Admitting: Gastroenterology

## 2020-02-29 NOTE — Telephone Encounter (Signed)
Dr. Barron Alvine  Dr. Roderic Ovens called for you regarding this pt please call him back at [phone] at your earliest convenience.

## 2020-03-04 NOTE — Telephone Encounter (Signed)
I spoke with Dr. Roderic Buchanan today regarding [name] and recent UDS+, ER eval, and his known prior hx of methamphetamine use. Currently on intrathecal pump for pain control, and otherwise not prescribed additional pain medications/controlled substances. Known meth use in the past, but currently enrolled in recovery program with regular appointments with a substance abuse counselor and has monthly f/u with Dr. Roderic Buchanan. However, he also has known significant reflux disease and ILD, and would benefit medically from an antireflux surgery for dual purposes of  reflux control and potentially decrease some of the pulmonary exacerbations.   After careful consideration and discussion, will plan to approach as follows:  - Schedule OV with me to discuss plan moving forward for reflux control - Will plan for future TIF date given enrollment in substance abuse program and regular f/u with Pain Management Clinic - Will obtain UDS day prior to TIF - Post op pain management to be coordinated through Dr. Roderic Buchanan

## 2020-03-06 NOTE — Telephone Encounter (Signed)
LMOM for patient to call back.

## 2020-03-08 NOTE — Telephone Encounter (Signed)
Unable to reach patient by phone. A appointment letter have been mailed out.

## 2020-03-15 ENCOUNTER — Telehealth: Payer: Self-pay | Admitting: Family Medicine

## 2020-03-15 NOTE — Telephone Encounter (Signed)
Medication:Adderall  Pharmacy: [facility] [address] [place]

## 2020-03-18 NOTE — Telephone Encounter (Signed)
I see from his chart that it was documented in January that he has been using illicit drugs including methamphetamines and cocaine. Tell him that I will no longer prescribe him ANY controlled medications, including Adderall and Ambien. I am willing to help with his other healthcare needs, but if he wishes to continue on these medications he will need to find another primary care provider

## 2020-03-18 NOTE — Telephone Encounter (Signed)
Okay for refill? Please advise 

## 2020-03-19 NOTE — Telephone Encounter (Signed)
Patient notified of update  and verbalized understanding. 

## 2020-03-19 NOTE — Telephone Encounter (Signed)
Left detailed message informing  of update. 

## 2020-03-19 NOTE — Telephone Encounter (Signed)
Please disregard last message. Left message for pt to return call.

## 2020-04-01 ENCOUNTER — Telehealth: Payer: Self-pay | Admitting: Family Medicine

## 2020-04-01 NOTE — Telephone Encounter (Signed)
Please advise 

## 2020-04-01 NOTE — Telephone Encounter (Signed)
[name] (ok per [employer]) is wondering if the Testerone could make the pt angry and on edge? She would like to know the side effects b/c he has been acting angry.   She states he has been taking meth along with his medication and would like for his PCP to know because she feels that might be counteracting with what he takes.   Ethan stated that the pt does not know that she is calling because she is concerned that he would be mad at her if he knows.   Ethan can be reached at [phone]

## 2020-04-02 NOTE — Telephone Encounter (Signed)
I have not prescribed him testosterone since 2017. He gets this from his urologist. Dr. Barbette Or

## 2020-04-02 NOTE — Telephone Encounter (Signed)
Attempted to return call. No answer. LVM for return call

## 2020-05-21 ENCOUNTER — Telehealth: Payer: Self-pay | Admitting: Acute Care

## 2020-05-21 MED ORDER — BREO ELLIPTA 100-25 MCG/INH IN AEPB: 1.0000 | INHALATION_SPRAY | Freq: Every day | RESPIRATORY_TRACT | 5 refills | Status: DC

## 2020-05-21 NOTE — Telephone Encounter (Signed)
Pt requesting Breo refill.  This has been sent to preferred pharmacy as requested.  Nothing further needed at this time- will close encounter.

## 2020-06-19 ENCOUNTER — Telehealth: Payer: Self-pay | Admitting: Gastroenterology

## 2020-06-19 NOTE — Telephone Encounter (Signed)
Pt is requesting to be scheduled for the TIF procedure with Dr Barron Alvine, and also needs a refill on his omeprazole.

## 2020-06-24 ENCOUNTER — Other Ambulatory Visit: Payer: Self-pay

## 2020-06-24 DIAGNOSIS — R1310 Dysphagia, unspecified: Secondary | ICD-10-CM

## 2020-06-24 DIAGNOSIS — K2210 Ulcer of esophagus without bleeding: Secondary | ICD-10-CM

## 2020-06-24 MED ORDER — OMEPRAZOLE 40 MG PO CPDR: 40.0000 mg | DELAYED_RELEASE_CAPSULE | Freq: Two times a day (BID) | ORAL | 1 refills | Status: DC

## 2020-06-25 NOTE — Telephone Encounter (Signed)
Message left on patients voicemail to call the office and schedule an appointment to discuss the TIF procedure as per Dr Fayne Norrie recommendations from last situation.    - Schedule OV with me to discuss plan moving forward for reflux control - Will plan for future TIF date given enrollment in substance abuse program and regular f/u with Pain Management Clinic - Will obtain UDS day prior to TIF - Post op pain management to be coordinated through Dr. Roderic Ovens

## 2020-06-28 NOTE — Telephone Encounter (Signed)
Pt is requesting a call back from a nurse to discuss the procedure TIF

## 2020-07-01 NOTE — Telephone Encounter (Signed)
LMOM for patient to call back.

## 2020-08-11 ENCOUNTER — Other Ambulatory Visit: Payer: Self-pay | Admitting: Family Medicine

## 2020-08-20 ENCOUNTER — Ambulatory Visit: Payer: Medicare Other | Admitting: Gastroenterology

## 2020-10-30 ENCOUNTER — Other Ambulatory Visit: Payer: Self-pay | Admitting: Gastroenterology

## 2020-10-30 DIAGNOSIS — R1310 Dysphagia, unspecified: Secondary | ICD-10-CM

## 2020-10-30 DIAGNOSIS — K2210 Ulcer of esophagus without bleeding: Secondary | ICD-10-CM

## 2021-01-30 ENCOUNTER — Other Ambulatory Visit: Payer: Self-pay | Admitting: Gastroenterology

## 2021-01-30 DIAGNOSIS — R1310 Dysphagia, unspecified: Secondary | ICD-10-CM

## 2021-01-30 DIAGNOSIS — K2210 Ulcer of esophagus without bleeding: Secondary | ICD-10-CM

## 2021-02-07 ENCOUNTER — Other Ambulatory Visit: Payer: Self-pay

## 2021-02-07 DIAGNOSIS — R1310 Dysphagia, unspecified: Secondary | ICD-10-CM

## 2021-02-07 DIAGNOSIS — K2210 Ulcer of esophagus without bleeding: Secondary | ICD-10-CM

## 2021-02-07 MED ORDER — OMEPRAZOLE 40 MG PO CPDR: DELAYED_RELEASE_CAPSULE | ORAL | 0 refills | Status: DC

## 2021-02-18 ENCOUNTER — Telehealth: Payer: Self-pay | Admitting: Pulmonary Disease

## 2021-02-18 MED ORDER — BREO ELLIPTA 100-25 MCG/INH IN AEPB: 1.0000 | INHALATION_SPRAY | Freq: Every day | RESPIRATORY_TRACT | 3 refills | Status: DC

## 2021-02-18 NOTE — Telephone Encounter (Signed)
Called and spoke with patient. He stated that he needed a refill on his Breo 100. Advised him that he was overdue for an OV. While on the phone, he stated that his cpap recently stopped working and he needed a new one. I was able to get him scheduled for 03/10/21 at 945 with VS.   Virgel Bouquet RX has been sent to the pharmacy for him. Nothing further needed at time of call.

## 2021-02-19 NOTE — Telephone Encounter (Signed)
rx refilled by Alberteen Sam, CMA

## 2021-02-25 ENCOUNTER — Telehealth (INDEPENDENT_AMBULATORY_CARE_PROVIDER_SITE_OTHER): Payer: Self-pay | Admitting: Family Medicine

## 2021-02-25 ENCOUNTER — Encounter: Payer: Self-pay | Admitting: Family Medicine

## 2021-02-25 VITALS — Temp 99.9°F | Wt 192.0 lb

## 2021-02-25 DIAGNOSIS — J40 Bronchitis, not specified as acute or chronic: Secondary | ICD-10-CM

## 2021-02-25 MED ORDER — AZITHROMYCIN 250 MG PO TABS: ORAL_TABLET | ORAL | 0 refills | Status: DC
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
## 2021-03-10 ENCOUNTER — Ambulatory Visit: Payer: Self-pay | Admitting: Pulmonary Disease

## 2021-03-22 ENCOUNTER — Other Ambulatory Visit: Payer: Self-pay | Admitting: Gastroenterology

## 2021-03-22 DIAGNOSIS — R1310 Dysphagia, unspecified: Secondary | ICD-10-CM

## 2021-03-22 DIAGNOSIS — K2210 Ulcer of esophagus without bleeding: Secondary | ICD-10-CM

## 2021-03-24 ENCOUNTER — Ambulatory Visit (INDEPENDENT_AMBULATORY_CARE_PROVIDER_SITE_OTHER): Payer: Self-pay | Admitting: Family Medicine

## 2021-03-24 ENCOUNTER — Encounter: Payer: Self-pay | Admitting: Family Medicine

## 2021-03-24 ENCOUNTER — Other Ambulatory Visit: Payer: Self-pay

## 2021-03-24 ENCOUNTER — Ambulatory Visit (INDEPENDENT_AMBULATORY_CARE_PROVIDER_SITE_OTHER): Payer: Self-pay

## 2021-03-24 VITALS — BP 96/64 | HR 52 | Temp 98.4°F | Wt 181.0 lb

## 2021-03-24 DIAGNOSIS — Z8679 Personal history of other diseases of the circulatory system: Secondary | ICD-10-CM

## 2021-03-24 DIAGNOSIS — R059 Cough, unspecified: Secondary | ICD-10-CM

## 2021-03-24 DIAGNOSIS — R0602 Shortness of breath: Secondary | ICD-10-CM

## 2021-03-24 DIAGNOSIS — J40 Bronchitis, not specified as acute or chronic: Secondary | ICD-10-CM

## 2021-03-24 MED ORDER — AMOXICILLIN-POT CLAVULANATE 875-125 MG PO TABS: 1.0000 | ORAL_TABLET | Freq: Two times a day (BID) | ORAL | 0 refills | Status: DC

## 2021-03-24 NOTE — Progress Notes (Signed)
   Subjective:    Patient ID: [name], male    DOB: 1971/06/14, 50 y.o.   MRN: [id number]  HPI Here to follow up a possible bronchitis. We had a virtual visit with him on 02-25-21 for chest congestion and a cough. Because of his interstitial lung disease we wanted to treat him aggressively, so he was gven a Zpack. He felt better for about a week, then the symptoms returned. Now for the past few weeks he has had chest tightness and an occasional dry cough. No fever. He also describes some occasional upper chest pains and SOB when he exerts himself. He gives an example of carrying a 20 lb box of sunglasses a short distance, and this caused him to have some chest discomfort and SOB that went away after he rested for 5 minutes. He has never had any cardiac evaluation, but coronary artery calcifications were seen on a chest CT he had done on 09-22-19. No ankle swelling.    Review of Systems  Constitutional: Negative.   HENT: Negative.   Eyes: Negative.   Respiratory: Positive for cough, chest tightness and shortness of breath. Negative for wheezing.   Cardiovascular: Positive for chest pain. Negative for palpitations and leg swelling.  Gastrointestinal: Negative.        Objective:   Physical Exam Constitutional:      Appearance: Normal appearance. He is not ill-appearing.  HENT:     Right Ear: Tympanic membrane, ear canal and external ear normal.     Left Ear: Tympanic membrane, ear canal and external ear normal.     Nose: Nose normal.     Mouth/Throat:     Pharynx: Oropharynx is clear.  Eyes:     Conjunctiva/sclera: Conjunctivae normal.  Cardiovascular:     Rate and Rhythm: Normal rate and regular rhythm.     Pulses: Normal pulses.     Heart sounds: Normal heart sounds.  Pulmonary:     Effort: Pulmonary effort is normal.     Breath sounds: No wheezing or rales.     Comments: He has bilateral dry crackles in the lower portions of both lungs  Lymphadenopathy:     Cervical:  No cervical adenopathy.  Neurological:     Mental Status: He is alert.           Assessment & Plan:  He likely has a partially treated bronchitis, so we will start him on 10 days of Augmentin. We will get a CXR today to rule out pneumonia. I am also concerned about the coronary artery calcifications, so we will refer him to Cardiology to evaluate this further.  Gershon Crane, MD

## 2021-03-25 ENCOUNTER — Telehealth: Payer: Self-pay | Admitting: Family Medicine

## 2021-03-25 NOTE — Progress Notes (Deleted)
_0  ID: [name], male    DOB: 1971-01-24, 50 y.o.   MRN: [id number]  No chief complaint on file.   Referring provider: Laurey Morale, MD  HPI: 50 year old male, never smoked. PMH significant for ILD, OSA on CPAP, Sjogren's syndrome, GERD, raynaud's disease, lupus, hx pulmonary embolism, ADHD, depression, anxiety. Patient of Dr. Halford Chessman, last seen 01/17/20.   03/26/2021 Patient presents today for overdue follow-up.   During last visit given Breo     Pulmonary testing:  PFT 07/08/07 >> FEV1 2.02 (52%), FEV1% 81, TLC 3.25 (53%), DLCO 73% PFT 06/13/10 >> FEV1 2.12 (54%), FEV1% 83, TLC 3.67 (58%), DLCO 70% Spirometry 11/2010  is suggestive of restrictive defect. Moderate restriction confirmed on lung volume testing. Mild diffusion defect. No significant bronchodilator response.  Serology: 05/2204 >> RF 31, ANCA negative, ANA 1:2560 speckled, anti DS DNA 68, anti Jo negative, anti SM positive, anti Ro/La positive, aldolase 3.1 09/15/19 >> ANCA negative, RF < 14, ANA positive, SM Ab > 8, SSA > 8, SCL 70 negative, SSB negative, dsDNA negative, aldolase 4.5, ESR 28  Imaging: CT chest 01/05/03 >> RLL PE CT chest 04/29/06 >> bibasilar linear scarring and slight subpleural honeycombing CT chest 07/08/07 >> mild reticulation and architectural distortion within subpleural and basilar distribution with minimal fibrosis, small HH CT chest 09/22/19 >> extensive septal thickening, subpleural reticulation, cylindrical BTX with honeycombing  Sleeping studies: HST 12/30/16 >> AHI 28.4, SaO2 low 49% Auto CPAP10/27/20 to 10/18/19 >>used on 29 of 30 nights with average 5 hrs 26 min. Average AHI 1.5 with median CPAP 10 and 95 th percentile CPAP 15 cm H2O.    Allergies  Allergen Reactions  . Armoracia Rusticana Ext (Horseradish) Anaphylaxis, Hives and Shortness Of Breath  . Other Hives    -Cocktail Sauce -(d)-limonene Flavor  . Morphine And Related Itching  . Trimethoprim Other (See  Comments)    Kidney Toxicity    . Rocephin [Ceftriaxone Sodium In Dextrose]     When taking over a longer period developed severe body aches, shaking, and headaches   . Sulfamethoxazole Rash    Immunization History  Administered Date(s) Administered  . Influenza Split 01/13/2012, 10/04/2012  . Influenza Whole 08/23/2005, 08/24/2008  . Influenza,inj,Quad PF,6+ Mos 11/10/2013, 02/07/2015, 08/16/2015, 10/27/2016, 09/15/2019    Past Medical History:  Diagnosis Date  . ADHD (attention deficit hyperactivity disorder)   . Anxiety   . Asthma    early adulthood/ exercise induced  . Avascular necrosis (HCC)    of knees from long term steroid use  . Bone pain    chronic  . Chronic kidney disease    just 1 kidney congenital  . Depression   . Esophageal ulcer   . GERD (gastroesophageal reflux disease)   . Hiatal hernia   . Hyperhydrosis disorder   . Insomnia   . Low back pain   . Lupus (systemic lupus erythematosus) (HCC)    Dr. Close at [facility] Rheumatology  . PE (pulmonary embolism)    hx  . Pericarditis    resolved  . Pulmonary fibrosis ([place])   . Raynaud's disease   . Sjogren's syndrome ([place])   . Sleep apnea     Tobacco History: Social History   Tobacco Use  Smoking Status Never Smoker  Smokeless Tobacco Never Used   Counseling given: Not Answered   Outpatient Medications Prior to Visit  Medication Sig Dispense Refill  . acetaminophen (TYLENOL) 500 MG tablet Take 1,000 mg by mouth every  6 (six) hours as needed for moderate pain or headache.    . ALPRAZolam (XANAX) 1 MG tablet Take 1 tablet (1 mg total) by mouth 3 (three) times daily as needed for anxiety. (Patient not taking: Reported on 03/24/2021) 90 tablet 5  . amoxicillin-clavulanate (AUGMENTIN) 875-125 MG tablet Take 1 tablet by mouth 2 (two) times daily. 20 tablet 0  . amphetamine-dextroamphetamine (ADDERALL) 30 MG tablet Take 1 tablet by mouth 3 (three) times daily. (Patient taking differently: Take 30 mg by  mouth daily as needed (focusing). ) 90 tablet 0  . COLLAGEN PO Take 1 Dose by mouth daily. Liquid collagen    . fluticasone furoate-vilanterol (BREO ELLIPTA) 100-25 MCG/INH AEPB Inhale 1 puff into the lungs daily. 60 each 3  . HYDROmorphone HCl (DILAUDID IJ) Inject as directed. By means of continuous infusion pump 0.3 mg/ml    . ibuprofen (ADVIL) 200 MG tablet Take 400 mg by mouth every 6 (six) hours as needed for headache or moderate pain.    Marland Kitchen omeprazole (PRILOSEC) 40 MG capsule TAKE ONE CAPSULE BY MOUTH TWICE A DAY BEFORE A MEAL. Please call [phone] to schedule an office visit for more refills. 60 capsule 0  . SYRINGE-NEEDLE, DISP, 3 ML (B-D SYRINGE/NEEDLE 3CC/23GX1") 23G X 1" 3 ML MISC Use for an intramuscular injection as directed 10 each 11  . testosterone cypionate (DEPOTESTOSTERONE CYPIONATE) 200 MG/ML injection INJECT 1 ML IN THE MUSCLE EVERY 2 WEEKS (Patient taking differently: Inject 120 mg into the muscle every Thursday.) 28 mL 1  . zolpidem (AMBIEN) 10 MG tablet TAKE ONE TABLET BY MOUTH EVERY NIGHT AT BEDTIME AS NEEDED FOR SLEEP (Patient taking differently: Take 10 mg by mouth at bedtime.) 30 tablet 5   Facility-Administered Medications Prior to Visit  Medication Dose Route Frequency Provider Last Rate Last Admin  . 0.9 %  sodium chloride infusion  500 mL Intravenous Continuous Pyrtle, Lajuan Lines, MD      . 0.9 %  sodium chloride infusion  500 mL Intravenous Once Cirigliano, Vito V, DO          Review of Systems  Review of Systems   Physical Exam  There were no vitals taken for this visit. Physical Exam   Lab Results:  CBC    Component Value Date/Time   WBC 6.1 09/15/2019 1035   RBC 4.77 09/15/2019 1035   HGB 15.7 09/15/2019 1035   HCT 47.1 09/15/2019 1035   PLT 190.0 09/15/2019 1035   MCV 98.8 09/15/2019 1035   MCH 33.1 11/10/2013 0636   MCHC 33.3 09/15/2019 1035   RDW 14.6 09/15/2019 1035   LYMPHSABS 1.3 09/15/2019 1035   MONOABS 0.8 09/15/2019 1035   EOSABS  0.2 09/15/2019 1035   BASOSABS 0.0 09/15/2019 1035    BMET    Component Value Date/Time   NA 136 09/15/2019 1035   K 4.1 09/15/2019 1035   CL 100 09/15/2019 1035   CO2 33 (H) 09/15/2019 1035   GLUCOSE 84 09/15/2019 1035   BUN 10 09/15/2019 1035   CREATININE 1.19 09/15/2019 1035   CALCIUM 9.0 09/15/2019 1035   GFRNONAA 73 (L) 11/09/2013 2100   GFRAA 84 (L) 11/09/2013 2100    BNP No results found for: BNP  ProBNP No results found for: PROBNP  Imaging: No results found.   Assessment & Plan:   No problem-specific Assessment & Plan notes found for this encounter.     Martyn Ehrich, NP 03/25/2021

## 2021-03-25 NOTE — Telephone Encounter (Signed)
Pt is calling to get his xray results and is aware that when the provider has resulted them someone will call and give him his results.

## 2021-03-26 ENCOUNTER — Ambulatory Visit: Payer: Self-pay | Admitting: Primary Care

## 2021-03-26 ENCOUNTER — Other Ambulatory Visit: Payer: Self-pay

## 2021-03-26 ENCOUNTER — Telehealth: Payer: Self-pay | Admitting: Family Medicine

## 2021-03-26 ENCOUNTER — Encounter: Payer: Self-pay | Admitting: Primary Care

## 2021-03-26 ENCOUNTER — Ambulatory Visit (INDEPENDENT_AMBULATORY_CARE_PROVIDER_SITE_OTHER): Payer: Self-pay | Admitting: Primary Care

## 2021-03-26 VITALS — BP 118/72 | HR 87 | Temp 98.5°F | Ht 67.0 in | Wt 183.8 lb

## 2021-03-26 DIAGNOSIS — G4733 Obstructive sleep apnea (adult) (pediatric): Secondary | ICD-10-CM

## 2021-03-26 DIAGNOSIS — Z9989 Dependence on other enabling machines and devices: Secondary | ICD-10-CM

## 2021-03-26 DIAGNOSIS — J8410 Pulmonary fibrosis, unspecified: Secondary | ICD-10-CM

## 2021-03-26 DIAGNOSIS — J849 Interstitial pulmonary disease, unspecified: Secondary | ICD-10-CM

## 2021-03-26 DIAGNOSIS — I700 Atherosclerosis of aorta: Secondary | ICD-10-CM

## 2021-03-26 HISTORY — DX: Atherosclerosis of aorta: I70.0

## 2021-03-26 NOTE — Telephone Encounter (Signed)
Patient is calling and wanted to see if the lab results were back yet, please advise. CB is [phone]

## 2021-03-26 NOTE — Assessment & Plan Note (Addendum)
-   ILD with hx SLE and Sjogrens. Serology positive for SM AB, SSA and ANA - HRCT with UIP pattern and shows progression. PFTs were relatively stable in January 2021.  - Referred back to [facility] rheumatology, they saw him for consult in March 2021. Primary goal was to restart therapy that will minimize progression of ILD. He is overdue for 3 month follow-up with them, not currently on medication - Patient reports benefit in breathing from addition of BREO but to expensive, patient will call insurance and see what formulary ICS/LABA inhaler is preferred on his plan  - FU in 6 months with PFTs

## 2021-03-26 NOTE — Telephone Encounter (Signed)
Will call when results post.

## 2021-03-26 NOTE — Progress Notes (Signed)
_0  ID: [name], male    DOB: 12-Aug-1971, 50 y.o.   MRN: [id number]  Chief Complaint  Patient presents with  . Follow-up    Reports issues with old cpap, wants new one.    Referring provider: Laurey Morale, MD  HPI: 50 year old male, never smoked. PMH significant for ILD, OSA on CPAP, Sjogren's syndrome, GERD, raynaud's disease, lupus, hx pulmonary embolism, ADHD, depression, anxiety. Patient of Dr. Halford Chessman, last seen by pulmonary NP 01/17/20.   03/26/2021- Interim hx  Patient presents today for overdue follow-up OSA/ILD. During last visit he was given sample of Breo which did help his breathing but was too expensive. Advair was also not affordable. He is requesting similar inhaler.   He is looking a new mask that does not put pressure on face. He is also needs a new machine, its > 5 years and reservoir is not working properly or warming. Currently using Resmed. Waking up with dry mouth, he had 6 cavities with his dentist recently.  ILD hx SLE and Sjogrens. Serology positive for SM AB, SSA and ANA HRCT with UIP pattern and shows progression. Referred back to [facility] rheumatology, they saw him for consult in March 2021 and recommended 3 month follow-up. Plan was to restart therapy. PFTs were relatively stable in January 2021. DLCO improved to prior.   Airview download 02/23/21-03/24/21 Usage 29/30 days (97%); 27 days (90%) > 4 hours Average usage 6 hours 21 mins Pressure 5-20cm h20 (14.6cm h20-95%) Airleaks 0.8L/min AHI 1.1  Pulmonary testing:  PFT 07/08/07 >> FEV1 2.02 (52%), FEV1% 81, TLC 3.25 (53%), DLCO 73% PFT 06/13/10 >> FEV1 2.12 (54%), FEV1% 83, TLC 3.67 (58%), DLCO 70% Spirometry 11/2010  is suggestive of restrictive defect. Moderate restriction confirmed on lung volume testing. Mild diffusion defect. No significant bronchodilator response.  Serology: 05/2204 >> RF 31, ANCA negative, ANA 1:2560 speckled, anti DS DNA 68, anti Jo negative, anti SM positive, anti Ro/La  positive, aldolase 3.1 09/15/19 >> ANCA negative, RF < 14, ANA positive, SM Ab > 8, SSA > 8, SCL 70 negative, SSB negative, dsDNA negative, aldolase 4.5, ESR 28  Imaging: CT chest 01/05/03 >> RLL PE CT chest 04/29/06 >> bibasilar linear scarring and slight subpleural honeycombing CT chest 07/08/07 >> mild reticulation and architectural distortion within subpleural and basilar distribution with minimal fibrosis, small HH CT chest 09/22/19 >> extensive septal thickening, subpleural reticulation, cylindrical BTX with honeycombing  Sleeping studies: HST 12/30/16 >> AHI 28.4, SaO2 low 49% Auto CPAP10/27/20 to 10/18/19 >>used on 29 of 30 nights with average 5 hrs 26 min. Average AHI 1.5 with median CPAP 10 and 95 th percentile CPAP 15 cm H2O.   Allergies  Allergen Reactions  . Armoracia Rusticana Ext (Horseradish) Anaphylaxis, Hives and Shortness Of Breath  . Other Hives    -Cocktail Sauce -(d)-limonene Flavor  . Morphine And Related Itching  . Trimethoprim Other (See Comments)    Kidney Toxicity    . Rocephin [Ceftriaxone Sodium In Dextrose]     When taking over a longer period developed severe body aches, shaking, and headaches   . Sulfamethoxazole Rash    Immunization History  Administered Date(s) Administered  . Influenza Split 01/13/2012, 10/04/2012  . Influenza Whole 08/23/2005, 08/24/2008  . Influenza,inj,Quad PF,6+ Mos 11/10/2013, 02/07/2015, 08/16/2015, 10/27/2016, 09/15/2019    Past Medical History:  Diagnosis Date  . ADHD (attention deficit hyperactivity disorder)   . Anxiety   . Asthma    early adulthood/ exercise induced  .  Avascular necrosis (HCC)    of knees from long term steroid use  . Bone pain    chronic  . Chronic kidney disease    just 1 kidney congenital  . Depression   . Esophageal ulcer   . GERD (gastroesophageal reflux disease)   . Hiatal hernia   . Hyperhydrosis disorder   . Insomnia   . Low back pain   . Lupus (systemic lupus erythematosus)  (HCC)    Dr. Close at [facility]umatology  . PE (pulmonary embolism)    hx  . Pericarditis    resolved  . Pulmonary fibrosis ([place])   . Raynaud's disease   . Sjogren's syndrome ([place])   . Sleep apnea     Tobacco History: Social History   Tobacco Use  Smoking Status Never Smoker  Smokeless Tobacco Never Used   Counseling given: Not Answered   Outpatient Medications Prior to Visit  Medication Sig Dispense Refill  . acetaminophen (TYLENOL) 500 MG tablet Take 1,000 mg by mouth every 6 (six) hours as needed for moderate pain or headache.    Marland Kitchen amoxicillin-clavulanate (AUGMENTIN) 875-125 MG tablet Take 1 tablet by mouth 2 (two) times daily. 20 tablet 0  . COLLAGEN PO Take 1 Dose by mouth daily. Liquid collagen    . HYDROmorphone HCl (DILAUDID IJ) Inject as directed. By means of continuous infusion pump 0.3 mg/ml    . ibuprofen (ADVIL) 200 MG tablet Take 400 mg by mouth every 6 (six) hours as needed for headache or moderate pain.    Marland Kitchen omeprazole (PRILOSEC) 40 MG capsule TAKE ONE CAPSULE BY MOUTH TWICE A DAY BEFORE A MEAL. Please call [phone] to schedule an office visit for more refills. 60 capsule 0  . SYRINGE-NEEDLE, DISP, 3 ML (B-D SYRINGE/NEEDLE 3CC/23GX1") 23G X 1" 3 ML MISC Use for an intramuscular injection as directed 10 each 11  . testosterone cypionate (DEPOTESTOSTERONE CYPIONATE) 200 MG/ML injection INJECT 1 ML IN THE MUSCLE EVERY 2 WEEKS (Patient taking differently: Inject 120 mg into the muscle every Thursday.) 28 mL 1  . zolpidem (AMBIEN) 10 MG tablet TAKE ONE TABLET BY MOUTH EVERY NIGHT AT BEDTIME AS NEEDED FOR SLEEP (Patient taking differently: Take 10 mg by mouth at bedtime.) 30 tablet 5  . ALPRAZolam (XANAX) 1 MG tablet Take 1 tablet (1 mg total) by mouth 3 (three) times daily as needed for anxiety. (Patient not taking: No sig reported) 90 tablet 5  . amphetamine-dextroamphetamine (ADDERALL) 30 MG tablet Take 1 tablet by mouth 3 (three) times daily. (Patient taking  differently: Take 30 mg by mouth daily as needed (focusing). ) 90 tablet 0  . fluticasone furoate-vilanterol (BREO ELLIPTA) 100-25 MCG/INH AEPB Inhale 1 puff into the lungs daily. (Patient not taking: Reported on 03/26/2021) 60 each 3   Facility-Administered Medications Prior to Visit  Medication Dose Route Frequency Provider Last Rate Last Admin  . 0.9 %  sodium chloride infusion  500 mL Intravenous Continuous Pyrtle, Lajuan Lines, MD      . 0.9 %  sodium chloride infusion  500 mL Intravenous Once Cirigliano, Vito V, DO       Review of Systems  Review of Systems  HENT: Positive for postnasal drip.   Respiratory: Positive for shortness of breath.    Physical Exam  BP 118/72 (BP Location: Left Arm, Cuff Size: Normal)   Pulse 87   Temp 98.5 F (36.9 C) (Temporal)   Ht _0  (1.702 m)   Wt 183 lb 12.8 oz (83.4  kg)   SpO2 100% Comment: RA  BMI 28.79 kg/m  Physical Exam Constitutional:      Appearance: Normal appearance.  HENT:     Mouth/Throat:     Mouth: Mucous membranes are moist.     Pharynx: Oropharynx is clear.  Cardiovascular:     Rate and Rhythm: Normal rate and regular rhythm.  Pulmonary:     Breath sounds: Rales present. No wheezing or rhonchi.  Musculoskeletal:        General: Normal range of motion.  Skin:    General: Skin is warm and dry.  Neurological:     General: No focal deficit present.     Mental Status: He is alert and oriented to person, place, and time. Mental status is at baseline.  Psychiatric:        Mood and Affect: Mood normal.        Behavior: Behavior normal.        Thought Content: Thought content normal.        Judgment: Judgment normal.      Lab Results:  CBC    Component Value Date/Time   WBC 6.1 09/15/2019 1035   RBC 4.77 09/15/2019 1035   HGB 15.7 09/15/2019 1035   HCT 47.1 09/15/2019 1035   PLT 190.0 09/15/2019 1035   MCV 98.8 09/15/2019 1035   MCH 33.1 11/10/2013 0636   MCHC 33.3 09/15/2019 1035   RDW 14.6 09/15/2019 1035    LYMPHSABS 1.3 09/15/2019 1035   MONOABS 0.8 09/15/2019 1035   EOSABS 0.2 09/15/2019 1035   BASOSABS 0.0 09/15/2019 1035    BMET    Component Value Date/Time   NA 136 09/15/2019 1035   K 4.1 09/15/2019 1035   CL 100 09/15/2019 1035   CO2 33 (H) 09/15/2019 1035   GLUCOSE 84 09/15/2019 1035   BUN 10 09/15/2019 1035   CREATININE 1.19 09/15/2019 1035   CALCIUM 9.0 09/15/2019 1035   GFRNONAA 73 (L) 11/09/2013 2100   GFRAA 84 (L) 11/09/2013 2100    BNP No results found for: BNP  ProBNP No results found for: PROBNP  Imaging: DG Chest 2 View  Result Date: 03/26/2021 CLINICAL DATA:  Nonproductive cough for approximately 4 weeks. EXAM: CHEST - 2 VIEW COMPARISON:  01/12/2010 FINDINGS: Heart size is within normal limits. Coarse interstitial opacities are again seen with bibasilar predominance, and evidence fibrosis in the left lung base. These findings appear slightly progressive compared to previous study in 2011. No evidence of pulmonary consolidation or pleural effusion. IMPRESSION: Progressive chronic interstitial lung disease since 2011. No acute findings. Electronically Signed   By: Marlaine Hind M.D.   On: 03/26/2021 08:16     Assessment & Plan:   Obstructive sleep apnea - Patient is 90% complaint with CPAP > 4 hours - Pressure 5-20cm h20; Residual AHI 1.1 - Order for new CPAP machine (currently using resmed) and needs to be fitted for new nasal mask  - FU in 6 months   INTERSTITIAL LUNG DISEASE - ILD with hx SLE and Sjogrens. Serology positive for SM AB, SSA and ANA - HRCT with UIP pattern and shows progression. PFTs were relatively stable in January 2021.  - Referred back to [facility] rheumatology, they saw him for consult in March 2021. Primary goal was to restart therapy that will minimize progression of ILD. He is overdue for 3 month follow-up with them, not currently on medication - Patient reports benefit in breathing from addition of BREO but to expensive, patient will call  insurance and see what formulary ICS/LABA inhaler is preferred on his plan  - FU in 6 months with PFTs   Atherosclerosis of aorta [facility]) - He has been referred to cardiology d/t atherosclerosis, awaiting apt scheduling    Martyn Ehrich, NP 03/26/2021

## 2021-03-26 NOTE — Assessment & Plan Note (Signed)
-   He has been referred to cardiology d/t atherosclerosis, awaiting apt scheduling

## 2021-03-26 NOTE — Assessment & Plan Note (Signed)
-   Patient is 90% complaint with CPAP > 4 hours - Pressure 5-20cm h20; Residual AHI 1.1 - Order for new CPAP machine (currently using resmed) and needs to be fitted for new nasal mask  - FU in 6 months

## 2021-03-26 NOTE — Patient Instructions (Addendum)
CPAP download shows that you are compliant with CPAP and showed that your apnea is well controlled on current pressure setting   Recommendations: - We did not have any samples of BREO or other inhalers that are equivalent  - Call insurance and ask what ICS/LABA is formulary on plan (options include Breo, Advair diskus or HFA, Symbicort, Dulera, AirDuo, [place] OR ICS inhalers such as Flovent, Pulmicort, arnuity)  - Please set apt with cardiology to evaluate aortic atherosclerosis  - You are overdue for follow-up with [facility] rheumatology, this is very important to manage ILD and Lupus   Orders: - Needs to be fitted for new nasal mask  - Needs new CPAP machine (resmed wait is around 4-6 months OR could look at [facility] machine which is newer) - PFTs in 6 months   Follow-up: - 6 months with Dr. Craige Cotta with PFTs

## 2021-03-27 ENCOUNTER — Encounter: Payer: Self-pay | Admitting: *Deleted

## 2021-03-28 ENCOUNTER — Ambulatory Visit: Payer: Self-pay | Admitting: Cardiology

## 2021-03-28 ENCOUNTER — Telehealth: Payer: Self-pay | Admitting: Primary Care

## 2021-03-28 DIAGNOSIS — G4733 Obstructive sleep apnea (adult) (pediatric): Secondary | ICD-10-CM

## 2021-03-28 NOTE — Telephone Encounter (Signed)
Patient was calling for X-Ray results from 05/04. They were checking for pneumonia and thought that he would have received a call with the results by now.  Please dvise

## 2021-03-28 NOTE — Telephone Encounter (Signed)
New order placed for the cpap with the settings from last OV note.

## 2021-03-28 NOTE — Telephone Encounter (Signed)
Please see msg below from [employer] regarding CT order.  Burna Forts; Truman Hayward, Melissa Hello,   We need to have the order updated to show the settings for the cpap machine to process this request. Once we get this updated info we will move forward with processing the pap unit.   I have created an order for the mask fitting in between time.   Thank you,   Luellen Pucker

## 2021-03-31 MED ORDER — ALPRAZOLAM 1 MG PO TABS: 1.0000 mg | ORAL_TABLET | Freq: Three times a day (TID) | ORAL | 2 refills | Status: DC | PRN

## 2021-03-31 NOTE — Telephone Encounter (Signed)
I sent in some more Xanax for him to take

## 2021-03-31 NOTE — Telephone Encounter (Signed)
Spoke with pt reviewed imaging results state  that  he thinks that his problem is being triggered by his new job which is very [employer], pt state that he will follow up with his Rheumatology to see what they recommend, pt is also requesting for a refill on a low dose of Xanax which he was taking previously and state that it helped him with managing his stress. Please advise

## 2021-03-31 NOTE — Telephone Encounter (Signed)
This encounter was routed to PCP for advise

## 2021-03-31 NOTE — Addendum Note (Signed)
Addended by: Gershon Crane A on: 03/31/2021 12:35 PM   Modules accepted: Orders

## 2021-04-02 NOTE — Telephone Encounter (Signed)
Sent in some more Xanax to your pharmacy

## 2021-04-20 ENCOUNTER — Other Ambulatory Visit: Payer: Self-pay | Admitting: Gastroenterology

## 2021-04-20 DIAGNOSIS — R1310 Dysphagia, unspecified: Secondary | ICD-10-CM

## 2021-04-20 DIAGNOSIS — K2210 Ulcer of esophagus without bleeding: Secondary | ICD-10-CM

## 2021-04-29 ENCOUNTER — Other Ambulatory Visit: Payer: Self-pay | Admitting: Gastroenterology

## 2021-04-29 DIAGNOSIS — K2210 Ulcer of esophagus without bleeding: Secondary | ICD-10-CM

## 2021-04-29 DIAGNOSIS — R1310 Dysphagia, unspecified: Secondary | ICD-10-CM

## 2021-05-15 ENCOUNTER — Telehealth: Payer: Self-pay | Admitting: Gastroenterology

## 2021-05-15 ENCOUNTER — Telehealth: Payer: Self-pay | Admitting: Pulmonary Disease

## 2021-05-15 DIAGNOSIS — G4733 Obstructive sleep apnea (adult) (pediatric): Secondary | ICD-10-CM

## 2021-05-15 DIAGNOSIS — R1310 Dysphagia, unspecified: Secondary | ICD-10-CM

## 2021-05-15 DIAGNOSIS — K2210 Ulcer of esophagus without bleeding: Secondary | ICD-10-CM

## 2021-05-15 MED ORDER — OMEPRAZOLE 40 MG PO CPDR: DELAYED_RELEASE_CAPSULE | ORAL | 1 refills | Status: DC

## 2021-05-15 NOTE — Telephone Encounter (Signed)
Pt just made an appt to see APP on 7/19 for med rf. Pt is requesting rf for Omeprazole as he is out of medication. His [facility] in [place].

## 2021-05-15 NOTE — Telephone Encounter (Signed)
Lm x1 for patient.  

## 2021-05-15 NOTE — Telephone Encounter (Signed)
Done

## 2021-05-19 DIAGNOSIS — F419 Anxiety disorder, unspecified: Secondary | ICD-10-CM | POA: Insufficient documentation

## 2021-05-19 DIAGNOSIS — G4700 Insomnia, unspecified: Secondary | ICD-10-CM | POA: Insufficient documentation

## 2021-05-19 DIAGNOSIS — M545 Low back pain, unspecified: Secondary | ICD-10-CM | POA: Insufficient documentation

## 2021-05-19 DIAGNOSIS — M8700 Idiopathic aseptic necrosis of unspecified bone: Secondary | ICD-10-CM | POA: Insufficient documentation

## 2021-05-19 DIAGNOSIS — J8410 Pulmonary fibrosis, unspecified: Secondary | ICD-10-CM | POA: Insufficient documentation

## 2021-05-19 DIAGNOSIS — J45909 Unspecified asthma, uncomplicated: Secondary | ICD-10-CM | POA: Insufficient documentation

## 2021-05-19 DIAGNOSIS — N189 Chronic kidney disease, unspecified: Secondary | ICD-10-CM | POA: Insufficient documentation

## 2021-05-19 DIAGNOSIS — M898X9 Other specified disorders of bone, unspecified site: Secondary | ICD-10-CM | POA: Insufficient documentation

## 2021-05-19 DIAGNOSIS — M3500 Sicca syndrome, unspecified: Secondary | ICD-10-CM | POA: Insufficient documentation

## 2021-05-19 DIAGNOSIS — K2210 Ulcer of esophagus without bleeding: Secondary | ICD-10-CM | POA: Insufficient documentation

## 2021-05-20 ENCOUNTER — Ambulatory Visit: Payer: BC Managed Care – PPO | Admitting: Cardiology

## 2021-05-20 NOTE — Telephone Encounter (Signed)
Called and spoke to pt. Pt is requesting nasal pillows for a new mask for his CPAP. New order placed. Pt verbalized understanding and denied any further questions or concerns at this time.

## 2021-06-10 ENCOUNTER — Ambulatory Visit: Payer: Self-pay | Admitting: Physician Assistant

## 2021-07-18 ENCOUNTER — Other Ambulatory Visit: Payer: Self-pay

## 2021-07-18 ENCOUNTER — Ambulatory Visit (INDEPENDENT_AMBULATORY_CARE_PROVIDER_SITE_OTHER): Payer: BC Managed Care – PPO | Admitting: Gastroenterology

## 2021-07-18 ENCOUNTER — Encounter: Payer: Self-pay | Admitting: Gastroenterology

## 2021-07-18 VITALS — BP 112/78 | HR 90 | Ht 67.0 in | Wt 182.1 lb

## 2021-07-18 DIAGNOSIS — K2100 Gastro-esophageal reflux disease with esophagitis, without bleeding: Secondary | ICD-10-CM | POA: Diagnosis not present

## 2021-07-18 DIAGNOSIS — M8700 Idiopathic aseptic necrosis of unspecified bone: Secondary | ICD-10-CM

## 2021-07-18 DIAGNOSIS — M350C Sjogren syndrome with dental involvement: Secondary | ICD-10-CM | POA: Diagnosis not present

## 2021-07-18 DIAGNOSIS — K449 Diaphragmatic hernia without obstruction or gangrene: Secondary | ICD-10-CM

## 2021-07-18 DIAGNOSIS — G894 Chronic pain syndrome: Secondary | ICD-10-CM

## 2021-07-18 DIAGNOSIS — M328 Other forms of systemic lupus erythematosus: Secondary | ICD-10-CM

## 2021-07-18 DIAGNOSIS — G4733 Obstructive sleep apnea (adult) (pediatric): Secondary | ICD-10-CM | POA: Diagnosis not present

## 2021-07-18 DIAGNOSIS — J849 Interstitial pulmonary disease, unspecified: Secondary | ICD-10-CM

## 2021-07-18 MED ORDER — OMEPRAZOLE 40 MG PO CPDR: 40.0000 mg | DELAYED_RELEASE_CAPSULE | Freq: Two times a day (BID) | ORAL | 5 refills | Status: DC

## 2021-07-18 NOTE — Patient Instructions (Signed)
If you are age 51 or older, your body mass index should be between 23-30. Your Body mass index is 28.52 kg/m. If this is out of the aforementioned range listed, please consider follow up with your Primary Care Provider.  If you are age 17 or younger, your body mass index should be between 19-25. Your Body mass index is 28.52 kg/m. If this is out of the aformentioned range listed, please consider follow up with your Primary Care Provider.   __________________________________________________________  The [facility] providers would like to encourage you to use [facility] to communicate with providers for non-urgent requests or questions.  Due to long hold times on the telephone, sending your provider a message by [facility] may be a faster and more efficient way to get a response.  Please allow 48 business hours for a response.  Please remember that this is for non-urgent requests.   We have sent the following medications to your pharmacy for you to pick up at your convenience: Omeprazole  You have been scheduled for an endoscopy. Please follow written instructions given to you at your visit today. If you use inhalers (even only as needed), please bring them with you on the day of your procedure.  Please call with any questions or concerns.  It was a pleasure to see you today!  Vito Cirigliano, D.O.

## 2021-07-18 NOTE — Progress Notes (Signed)
Chief Complaint:    GERD, medication refill, discuss antireflux surgery  GI History: 50 year old male with a history of SLE, Sjogren's syndrome, ILD, OSA on CPAP, ADHD, history of PE, depression, anxiety, avascular necrosis due to steroid use with chronic pain, follows in pain management clinic, history of drug use.  Follows in the GI clinic for longstanding history of reflux.  Reflux symptoms largely well controlled Prilosec 40 mg bid.  Will have breakthrough symptoms with any missed doses.  Breakthrough with previous attempts to titrate to lower dose.  History of dysphagia, that resolved when starting high-dose PPI.  Patient has been interested in antireflux surgical options as a means to better control his reflux, and potentially reduce the need for acid suppression therapy, particular with his concerns about bone fracture with his prior history of avascular necrosis.  Had previously evaluated for TIF in 03/2019 and had patient set up for procedure at that time.  TIF was scheduled for 01/2020, but was canceled after patient was admitted for hallucinations 2/2 methamphetamine and cocaine use.  He subsequently enrolled in recovery program with regular appointments with substance abuse counselor and monthly follow-up with Dr. Roderic Ovens (Pain Medicine).  GERD history: -Index symptoms: Heartburn, regurgitation -Exacerbating features: Greasy food, sugary food, eating close to bedtime -Medications trialed:  -Current medications: Prilosec 40 mg bid -Complications: Erosive esophagitis, hiatal hernia  GERD evaluation: -Last EGD: 04/2019 -Barium esophagram: -Esophageal Manometry: 06/2019: Incomplete bolus clearance with 7/10 swallows.  Normal peristalsis on 10/10 swallows.  1.6 cm HH -pH/Impedance: N/A -Bravo: N/A  Endoscopic History: - EGD (10/2013, Dr. Elnoria Howard): Mild erosive esophagitis, "probable" 3 cm sliding hiatal hernia - EGD (08/2017, Dr. Rhea Belton): Erosive esophagitis, linear ulcer in lower esophagus  (biopsy with reflux changes with ulcer), 1 to 2 cm HH - EGD (04/2019, Dr. Barron Alvine): LA Grade A esophagitis, <2 cm HH, Hill Grade 2 valve, mild gastritis  GERD-HRQL Questionnaire Score: 23/50, marked "satisfied" with current health condition   HPI:     Patient is a 50 y.o. male presenting to the Gastroenterology Clinic for follow-up.  Last seen by me via virtual appointment on 04/12/2019.  Had planned on TIF in 01/2020, but this was canceled due to active drug use as outlined above.  Since then, reflux controlled with omeprazole 40 mg bid. Has missed evening doses at times with breakthrough, but breakthorugh if consecutive missed doses. No dysphagia.   Since last appointment, has gone through rigorous drug rehab with regular UDS testing.  No use since 01/2020.  Follows in the Pulmonary Clinic, last seen on 03/26/2021 for OSA, ILD.  Referred back to [facility] given history of SLE, Sjogren's, positive serologies.  Has appointment in October.  Breo treatment too expensive for patient.  Last seen by Dr. Roderic Ovens in [facility] on 05/07/2021.  Currently treated with intrathecal hydromorphone.  No oral opioids.  As above, denies any cocaine or methamphetamine use since 01/2020.  Has spinal cord stimulator, with plan for battery exchange soon.  Review of systems:     No chest pain, no SOB, no fevers, no urinary sx   Past Medical History:  Diagnosis Date   Anxiety    Asthma    early adulthood/ exercise induced   Atherosclerosis of aorta (HCC) 03/26/2021   Attention deficit hyperactivity disorder (ADHD) 04/03/2008   Qualifier: Diagnosis of  By: Linna Darner, CMA, Cindy     Avascular bone necrosis (HCC) 02/07/2020   Avascular necrosis (HCC)    of knees from long term steroid  use   Backache 07/22/2007   Qualifier: Diagnosis of  By: Linna Darner, CMA, Cindy     Bladder neck obstruction 03/23/2011   Bone pain    chronic   Chronic kidney disease    just 1 kidney congenital   Chronic  steroid use 11/10/2013   Coffee ground emesis 11/10/2013   DEPRESSIVE DISORDER, RCR, MODERATE 06/30/2007   Qualifier: Diagnosis of  By: Clent Ridges MD, Tera Mater    DERMATOMYOSITIS 04/27/2007   Qualifier: Diagnosis of  By: Linna Darner, CMA, Cindy     Diarrhea 11/10/2013   Esophageal ulcer    ETHMOID SINUSITIS 04/03/2008   Qualifier: Diagnosis of  By: Linna Darner, CMA, Cindy     Extremity pain 04/04/2013   Generalized anxiety disorder 04/03/2008   Qualifier: Diagnosis of  By: Linna Darner, CMA, Cindy     GERD 04/03/2008   Qualifier: Diagnosis of  By: Linna Darner, CMA, Cindy     GI bleed 11/09/2013   H/O long-term treatment with high-risk medication 02/22/2012   HEADACHE 10/01/2010   Qualifier: Diagnosis of  By: Clent Ridges MD, Tera Mater    Hematemesis 11/10/2013   Hiatal hernia    History of pulmonary embolism 10/18/2018   HYPERHIDROSIS 04/27/2007   Qualifier: Diagnosis of  By: Linna Darner, CMA, Cindy     Hypogonadism, male 03/26/2009   Qualifier: Diagnosis of  By: Everardo All MD, Sean A    Impotence of organic origin 03/01/2012   Insomnia    INTERSTITIAL LUNG DISEASE 04/27/2007   Qualifier: Diagnosis of  By: Linna Darner, CMA, Cindy     LEUKOPENIA, CHRONIC 04/27/2007   Qualifier: Diagnosis of  By: Linna Darner, CMA, Cindy     Low back pain    LUPUS 04/27/2007   Qualifier: Diagnosis of  By: Linna Darner, CMA, Cindy     N&V (nausea and vomiting) 11/10/2013   Neck pain 12/19/2012   Obstructive sleep apnea 10/27/2016   Pain 02/25/2016   Pain syndrome, chronic 12/05/2012   PERICARDITIS 04/27/2007   Qualifier: Diagnosis of  By: Linna Darner, CMA, Cindy     PITUITARY INSUFFICIENCY 03/26/2009   Qualifier: Diagnosis of  By: Everardo All MD, Sean A    PLEURISY 04/27/2007   Qualifier: Diagnosis of  By: Linna Darner, CMA, Cindy     Presence of intrathecal pump 01/30/2020   Formatting of this note might be different from the original. Added automatically from request for surgery [id number]   Pulmonary embolism (HCC) 09/03/2011   Formatting of this note might be different  from the original. somewhere between 2003-07 - took blood thinners for several months, then stopped   PULMONARY EMBOLISM, HX OF 04/27/2007   Qualifier: Diagnosis of  By: Linna Darner, CMA, Cindy     Pulmonary fibrosis (HCC)    RAYNAUD'S DISEASE 05/28/2009   Qualifier: Diagnosis of  By: Alphonzo Severance MD, Loni Dolly    Sjogren's syndrome [facility])    Solitary kidney, congenital 02/07/2020   Substance abuse (HCC) 12/06/2019   Seen 12/01/2019 at [facility] for hallucinations after ingestion of  meth taken 11/28/2019.    Patient's surgical history, family medical history, social history, medications and allergies were all reviewed in Epic    Current Outpatient Medications  Medication Sig Dispense Refill   acetaminophen (TYLENOL) 500 MG tablet Take 1,000 mg by mouth every 6 (six) hours as needed for moderate pain or headache.     ALPRAZolam (XANAX) 1 MG tablet Take 1 tablet (1 mg total) by mouth 3 (three) times daily as needed for anxiety. 90 tablet 2   COLLAGEN PO Take 1  Dose by mouth daily. Liquid collagen     ibuprofen (ADVIL) 200 MG tablet Take 400 mg by mouth every 6 (six) hours as needed for headache or moderate pain.     omeprazole (PRILOSEC) 40 MG capsule Take 40 mg by mouth 2 (two) times daily.     testosterone cypionate (DEPOTESTOSTERONE CYPIONATE) 200 MG/ML injection Inject 120 mg into the muscle once a week. On Thursdays     zolpidem (AMBIEN) 10 MG tablet Take 10 mg by mouth at bedtime as needed for sleep.     amphetamine-dextroamphetamine (ADDERALL) 30 MG tablet Take 1 tablet by mouth 3 (three) times daily. (Patient taking differently: Take 30 mg by mouth daily as needed (focusing). ) 90 tablet 0   No current facility-administered medications for this visit.    Physical Exam:     BP 112/78   Pulse 90   Ht 5\' 7"  (1.702 m)   Wt 182 lb 2 oz (82.6 kg)   SpO2 99%   BMI 28.52 kg/m   GENERAL:  Pleasant male in NAD PSYCH: : Cooperative, normal affect Musculoskeletal:  Normal muscle tone, normal  strength NEURO: Alert and oriented x 3, no focal neurologic deficits   IMPRESSION and PLAN:    1) GERD with erosive esophagitis 2) Hiatal hernia  - Repeat EGD now for preoperative assessment to evaluate for change in hernia size which could necessitate change in surgical plan - Evaluate for severe erosive esophagitis - Continue Prilosec 40 mg BID.  Refill placed today - Continue antireflux lifestyle/dietary modifications - Again discussed the risks, benefits, alternatives of TIF at length today, to include possibility of concomitant laparoscopic hiatal hernia repair if hernia has increased in size.  Discussed surgical antireflux options  3) ILD 4) OSA - Discussed potential overlap between uncontrolled reflux and ILD with literature describing benefit and antireflux surgery - Continue regular follow-up in the Pulmonary clinic - If planning antireflux surgery, would ask Hospitalist assistance in overnight admission due to complex medical issues  5) Sjogren's 6) Raynaud's 7) SLE - Continue follow-up at [facility] as planned - Discussed postoperative healing in context of history of SLE - Patient to make his Rheumatologist aware of potential elective antireflux surgery  8) Chronic pain syndrome 9) History of avascular necrosis - Has intrathecal pain pump, but no additional oral pain medications - Has regular follow-up with [facility] in Pain Management - Per my prior discussion with Dr. Roderic Ovens, postoperative pain management would be through his clinic due to complex history  The indications, risks, and benefits of EGD were explained to the patient in detail. Risks include but are not limited to bleeding, perforation, adverse reaction to medications, and cardiopulmonary compromise. Sequelae include but are not limited to the possibility of surgery, hospitalization, and mortality. The patient verbalized understanding and wished to proceed. All questions answered, referred to  scheduler. Further recommendations pending results of the exam.   I spent 40 minutes of time, including in depth chart review, independent review of results as outlined above, communicating results with the patient directly, face-to-face time with the patient, coordinating care, ordering studies and medications as appropriate, and documentation.         Roderic Ovens ,DO, FACG 07/18/2021, 3:40 PM

## 2021-07-22 ENCOUNTER — Other Ambulatory Visit: Payer: Self-pay | Admitting: Family Medicine

## 2021-07-22 NOTE — Telephone Encounter (Signed)
Pt LOV was 03/24/21 Last refill done 03/31/2021 Please advise

## 2021-08-11 ENCOUNTER — Encounter: Payer: BC Managed Care – PPO | Admitting: Gastroenterology

## 2021-08-28 ENCOUNTER — Telehealth: Payer: Self-pay | Admitting: Family Medicine

## 2021-08-28 DIAGNOSIS — R0602 Shortness of breath: Secondary | ICD-10-CM

## 2021-08-28 NOTE — Telephone Encounter (Signed)
Patient called asking if a referral could be placed for a cardiologist. Patient says there was one placed for him before, but it has expired before he spoke with them so he is needing a new referral for Dr. Tomie China.  Patient would like a call at [phone] once referral has been placed.  Please advise.

## 2021-08-28 NOTE — Telephone Encounter (Signed)
I put in another referral

## 2021-08-28 NOTE — Telephone Encounter (Signed)
Previous referral sent to Cardiology on 03/24/21, closed due to two no show appointment.    Patient requesting new referral to be sent to Dr. Tomie China. Is okay for new referral to be placed?     Please advise

## 2021-08-28 NOTE — Telephone Encounter (Signed)
Called patient to make aware that referral has been sent to Dr. Tomie China as requested.

## 2021-10-03 ENCOUNTER — Other Ambulatory Visit: Payer: Self-pay

## 2021-10-06 ENCOUNTER — Ambulatory Visit (INDEPENDENT_AMBULATORY_CARE_PROVIDER_SITE_OTHER): Payer: BC Managed Care – PPO | Admitting: Cardiology

## 2021-10-06 ENCOUNTER — Other Ambulatory Visit: Payer: Self-pay

## 2021-10-06 ENCOUNTER — Encounter: Payer: Self-pay | Admitting: Cardiology

## 2021-10-06 VITALS — BP 120/68 | HR 78 | Ht 67.0 in | Wt 182.0 lb

## 2021-10-06 DIAGNOSIS — I209 Angina pectoris, unspecified: Secondary | ICD-10-CM

## 2021-10-06 DIAGNOSIS — J8410 Pulmonary fibrosis, unspecified: Secondary | ICD-10-CM

## 2021-10-06 DIAGNOSIS — I700 Atherosclerosis of aorta: Secondary | ICD-10-CM

## 2021-10-06 DIAGNOSIS — I25110 Atherosclerotic heart disease of native coronary artery with unstable angina pectoris: Secondary | ICD-10-CM | POA: Diagnosis not present

## 2021-10-06 DIAGNOSIS — I2510 Atherosclerotic heart disease of native coronary artery without angina pectoris: Secondary | ICD-10-CM

## 2021-10-06 HISTORY — DX: Atherosclerotic heart disease of native coronary artery without angina pectoris: I25.10

## 2021-10-06 HISTORY — DX: Angina pectoris, unspecified: I20.9

## 2021-10-06 MED ORDER — NITROGLYCERIN 0.4 MG SL SUBL
0.4000 mg | SUBLINGUAL_TABLET | SUBLINGUAL | 6 refills | Status: DC | PRN
Start: 2021-10-06 — End: 2021-10-15

## 2021-10-06 MED ORDER — ASPIRIN EC 81 MG PO TBEC: 81.0000 mg | DELAYED_RELEASE_TABLET | Freq: Every day | ORAL | 3 refills | Status: DC
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
## 2021-10-06 NOTE — Patient Instructions (Signed)
Medication Instructions:  Your physician has recommended you make the following change in your medication:   Take 81 mg coated aspirin daily.  Use nitroglycerin 1 tablet placed under the tongue at the first sign of chest pain or an angina attack. 1 tablet may be used every 5 minutes as needed, for up to 15 minutes. Do not take more than 3 tablets in 15 minutes. If pain persist call 911 or go to the nearest ED.   *If you need a refill on your cardiac medications before your next appointment, please call your pharmacy*   Lab Work: Your physician recommends that you have a BMET and CBC today in the office for your upcoming procedure.  If you have labs (blood work) drawn today and your tests are completely normal, you will receive your results only by: MyChart Message (if you have MyChart) OR A paper copy in the mail If you have any lab test that is abnormal or we need to change your treatment, we will call you to review the results.   Testing/Procedures:  [facility] CARDIOVASCULAR DIV[facility] [place] [address], [address] [place] [address] Dept: [phone] Loc: [phone]  [name]  10/06/2021  You are scheduled for a Cardiac Catheterization on Tuesday, November 22 with Dr. Lance Muss.  1. Please arrive at the [facility] (Main Entrance A) at [facility]: [address] [place], [place] [address] at 5:30 AM (This time is two hours before your procedure to ensure your preparation). Free valet parking service is available.   Special note: Every effort is made to have your procedure done on time. Please understand that emergencies sometimes delay scheduled procedures.  2. Diet: Do not eat solid foods after midnight.  The patient may have clear liquids until 5am upon the day of the procedure.  3. Labs: You had your labs done today in the office.  4. Medication instructions in preparation for your  procedure:   Contrast Allergy: No  Stop taking, cialis Saturday, November 19,  On the morning of your procedure, take your Aspirin and any morning medicines NOT listed above.  You may use sips of water.  5. Plan for one night stay--bring personal belongings. 6. Bring a current list of your medications and current insurance cards. 7. You MUST have a responsible person to drive you home. 8. Someone MUST be with you the first 24 hours after you arrive home or your discharge will be delayed. 9. Please wear clothes that are easy to get on and off and wear slip-on shoes.  Thank you for allowing [place] to care for you!   -- [facility]    Follow-Up: At [facility], you and your health needs are our priority.  As part of our continuing mission to provide you with exceptional heart care, we have created designated Provider Care Teams.  These Care Teams include your primary Cardiologist (physician) and Advanced Practice Providers (APPs -  Physician Assistants and Nurse Practitioners) who all work together to provide you with the care you need, when you need it.  We recommend signing up for the patient portal called "MyChart".  Sign up information is provided on this After Visit Summary.  MyChart is used to connect with patients for Virtual Visits (Telemedicine).  Patients are able to view lab/test results, encounter notes, upcoming appointments, etc.  Non-urgent messages can be sent to your provider as well.   To learn  more about what you can do with MyChart, go to [email or web address].    Your next appointment:   1 month(s)  The format for your next appointment:   In Person  Provider:   Belva Crome, MD   Other Instructions  Coronary Angiogram With Stent Coronary angiogram with stent placement is a procedure to widen or open a narrow blood vessel of the heart (coronary artery). Arteries may become blocked by cholesterol buildup (plaques) in the lining  of the artery wall. When a coronary artery becomes partially blocked, blood flow to that area decreases. This may lead to chest pain or a heart attack (myocardial infarction). A stent is a small piece of metal that looks like mesh or spring. Stent placement may be done as treatment after a heart attack, or to prevent a heart attack if a blocked artery is found by a coronary angiogram. Let your health care provider know about: Any allergies you have, including allergies to medicines or contrast dye. All medicines you are taking, including vitamins, herbs, eye drops, creams, and over-the-counter medicines. Any problems you or family members have had with anesthetic medicines. Any blood disorders you have. Any surgeries you have had. Any medical conditions you have, including kidney problems or kidney failure. Whether you are pregnant or may be pregnant. Whether you are breastfeeding. What are the risks? Generally, this is a safe procedure. However, serious problems may occur, including: Damage to nearby structures or organs, such as the heart, blood vessels, or kidneys. A return of blockage. Bleeding, infection, or bruising at the insertion site. A collection of blood under the skin (hematoma) at the insertion site. A blood clot in another part of the body. Allergic reaction to medicines or dyes. Bleeding into the abdomen (retroperitoneal bleeding). Stroke (rare). Heart attack (rare). What happens before the procedure? Staying hydrated Follow instructions from your health care provider about hydration, which may include: Up to 2 hours before the procedure - you may continue to drink clear liquids, such as water, clear fruit juice, black coffee, and plain tea.    Eating and drinking restrictions Follow instructions from your health care provider about eating and drinking, which may include: 8 hours before the procedure - stop eating heavy meals or foods, such as meat, fried foods, or fatty  foods. 6 hours before the procedure - stop eating light meals or foods, such as toast or cereal. 2 hours before the procedure - stop drinking clear liquids. Medicines Ask your health care provider about: Changing or stopping your regular medicines. This is especially important if you are taking diabetes medicines or blood thinners. Taking medicines such as aspirin and ibuprofen. These medicines can thin your blood. Do not take these medicines unless your health care provider tells you to take them. Generally, aspirin is recommended before a thin tube, called a catheter, is passed through a blood vessel and inserted into the heart (cardiac catheterization). Taking over-the-counter medicines, vitamins, herbs, and supplements. General instructions Do not use any products that contain nicotine or tobacco for at least 4 weeks before the procedure. These products include cigarettes, e-cigarettes, and chewing tobacco. If you need help quitting, ask your health care provider. Plan to have someone take you home from the hospital or clinic. If you will be going home right after the procedure, plan to have someone with you for 24 hours. You may have tests and imaging procedures. Ask your health care provider: How your insertion site will be marked. Ask which artery will be used  for the procedure. What steps will be taken to help prevent infection. These may include: Removing hair at the insertion site. Washing skin with a germ-killing soap. Taking antibiotic medicine. What happens during the procedure? An IV will be inserted into one of your veins. Electrodes may be placed on your chest to monitor your heart rate during the procedure. You will be given one or more of the following: A medicine to help you relax (sedative). A medicine to numb the area (local anesthetic) for catheter insertion. A small incision will be made for catheter insertion. The catheter will be inserted into an artery using a  guide wire. The location may be in your groin, your wrist, or the fold of your arm (near your elbow). An X-ray procedure (fluoroscopy) will be used to help guide the catheter to the opening of the heart arteries. A dye will be injected into the catheter. X-rays will be taken. The dye helps to show where any narrowing or blockages are located in the arteries. Tell your health care provider if you have chest pain or trouble breathing. A tiny wire will be guided to the blocked spot, and a balloon will be inflated to make the artery wider. The stent will be expanded to crush the plaques into the wall of the vessel. The stent will hold the area open and improve the blood flow. Most stents have a drug coating to reduce the risk of the stent narrowing over time. The artery may be made wider using a drill, laser, or other tools that remove plaques. The catheter will be removed when the blood flow improves. The stent will stay where it was placed, and the lining of the artery will grow over it. A bandage (dressing) will be placed on the insertion site. Pressure will be applied to stop bleeding. The IV will be removed. This procedure may vary among health care providers and hospitals.    What happens after the procedure? Your blood pressure, heart rate, breathing rate, and blood oxygen level will be monitored until you leave the hospital or clinic. If the procedure is done through the leg, you will lie flat in bed for a few hours or for as long as told by your health care provider. You will be instructed not to bend or cross your legs. The insertion site and the pulse in your foot or wrist will be checked often. You may have more blood tests, X-rays, and a test that records the electrical activity of your heart (electrocardiogram, or ECG). Do not drive for 24 hours if you were given a sedative during your procedure. Summary Coronary angiogram with stent placement is a procedure to widen or open a narrowed  coronary artery. This is done to treat heart problems. Before the procedure, let your health care provider know about all the medical conditions and surgeries you have or have had. This is a safe procedure. However, some problems may occur, including damage to nearby structures or organs, bleeding, blood clots, or allergies. Follow your health care provider's instructions about eating, drinking, medicines, and other lifestyle changes, such as quitting tobacco use before the procedure. This information is not intended to replace advice given to you by your health care provider. Make sure you discuss any questions you have with your health care provider. Document Revised: 05/31/2019 Document Reviewed: 05/31/2019 Elsevier Patient Education  2021 Elsevier Inc.  Aspirin and Your Heart Aspirin is a medicine that prevents the platelets in your blood from sticking together. Platelets are  the cells that your blood uses for clotting. Aspirin can be used to help reduce the risk of blood clots, heart attacks, and other heart-related problems. What are the risks? Daily use of aspirin can cause side effects. Some of these include: Bleeding. Bleeding can be minor or serious. An example of minor bleeding is bleeding from a cut, and the bleeding does not stop. An example of more serious bleeding is stomach bleeding or, rarely, bleeding into the brain. Your risk of bleeding increases if you are also taking NSAIDs, such as ibuprofen. Increased bruising. Upset stomach. An allergic reaction. People who have growths inside the nose (nasal polyps) have an increased risk of developing an aspirin allergy. How to use aspirin to care for your heart Take aspirin only as told by your health care provider. Make sure that you understand how much to take and what form to take. The two forms of aspirin are: Non-enteric-coated.This type of aspirin does not have a coating and is absorbed quickly. This type of aspirin also comes in  a chewable form. Enteric-coated. This type of aspirin has a coating that releases the medicine very slowly. Enteric-coated aspirin might cause less stomach upset than non-enteric-coated aspirin. This type of aspirin should not be chewed or crushed. Work with your health care provider to find out whether it is safe and beneficial for you to take aspirin daily. Taking aspirin daily may be helpful if: You have had a heart attack or chest pain, or you are at risk for a heart attack. You have a condition in which certain heart vessels are blocked (coronary artery disease), and you have had a procedure to treat it. Examples are: Open-heart surgery, such as coronary artery bypass surgery (CABG). Coronary angioplasty,which is done to widen a blood vessel of your heart. Having a small mesh tube, or stent, placed in your coronary artery. You have had certain types of stroke or a mini-stroke known as a transient ischemic attack (TIA). You have a narrowing of the arteries that supply the limbs (peripheral artery disease, or PAD). You have long-term (chronic) heart rhythm problems, such as atrial fibrillation, and your health care provider thinks aspirin may help. You have valve disease or have had surgery on a valve. You are considered at increased risk of developing coronary artery disease or PAD.    Follow these instructions at home Medicines Take over-the-counter and prescription medicines only as told by your health care provider. If you are taking blood thinners: Talk with your health care provider before you take any medicines that contain aspirin or NSAIDs, such as ibuprofen. These medicines increase your risk for dangerous bleeding. Take your medicine exactly as told, at the same time every day. Avoid activities that could cause injury or bruising, and follow instructions about how to prevent falls. Wear a medical alert bracelet or carry a card that lists what medicines you take. General  instructions Do not drink alcohol if: Your health care provider tells you not to drink. You are pregnant, may be pregnant, or are planning to become pregnant. If you drink alcohol: Limit how much you use to: 0-1 drink a day for women. 0-2 drinks a day for men. Be aware of how much alcohol is in your drink. In the [place] one drink equals one 12 oz bottle of beer (355 mL), one 5 oz glass of wine (148 mL), or one 1 oz glass of hard liquor (44 mL). Keep all follow-up visits as told by your health care provider. This  is important. Where to find more information The American Heart Association: www.heart.org Contact a health care provider if you have: Unusual bleeding or bruising. Stomach pain or nausea. Ringing in your ears. An allergic reaction that causes hives, itchy skin, or swelling of the lips, tongue, or face. Get help right away if: You notice that your bowel movements are bloody, or dark red or black in color. You vomit or cough up blood. You have blood in your urine. You cough, breathe loudly (wheeze), or feel short of breath. You have chest pain, especially if the pain spreads to your arms, back, neck, or jaw. You have a headache with confusion. You have any symptoms of a stroke. "BE FAST" is an easy way to remember the main warning signs of a stroke: B - Balance. Signs are dizziness, sudden trouble walking, or loss of balance. E - Eyes. Signs are trouble seeing or a sudden change in vision. F - Face. Signs are sudden weakness or numbness of the face, or the face or eyelid drooping on one side. A - Arms. Signs are weakness or numbness in an arm. This happens suddenly and usually on one side of the body. S - Speech. Signs are sudden trouble speaking, slurred speech, or trouble understanding what people say. T - Time. Time to call emergency services. Write down what time symptoms started. You have other signs of a stroke, such as: A sudden, severe headache with no known  cause. Nausea or vomiting. Seizure. These symptoms may represent a serious problem that is an emergency. Do not wait to see if the symptoms will go away. Get medical help right away. Call your local emergency services (911 in the [place] Do not drive yourself to the hospital. Summary Aspirin use can help reduce the risk of blood clots, heart attacks, and other heart-related problems. Daily use of aspirin can cause side effects. Take aspirin only as told by your health care provider. Make sure that you understand how much to take and what form to take. Your health care provider will help you determine whether it is safe and beneficial for you to take aspirin daily. This information is not intended to replace advice given to you by your health care provider. Make sure you discuss any questions you have with your health care provider. Document Revised: 08/14/2019 Document Reviewed: 08/14/2019 Elsevier Patient Education  2021 [place]. Nitroglycerin sublingual tablets What is this medicine? NITROGLYCERIN (nye troe GLI ser in) is a type of vasodilator. It relaxes blood vessels, increasing the blood and oxygen supply to your heart. This medicine is used to relieve chest pain caused by angina. It is also used to prevent chest pain before activities like climbing stairs, going outdoors in cold weather, or sexual activity. This medicine may be used for other purposes; ask your health care provider or pharmacist if you have questions. COMMON BRAND NAME(S): Nitroquick, Nitrostat, Nitrotab What should I tell my health care provider before I take this medicine? They need to know if you have any of these conditions: anemia head injury, recent stroke, or bleeding in the brain liver disease previous heart attack an unusual or allergic reaction to nitroglycerin, other medicines, foods, dyes, or preservatives pregnant or trying to get pregnant breast-feeding How should I use this medicine? Take this  medicine by mouth as needed. Use at the first sign of an angina attack (chest pain or tightness). You can also take this medicine 5 to 10 minutes before an event likely to produce chest  pain. Follow the directions exactly as written on the prescription label. Place one tablet under your tongue and let it dissolve. Do not swallow whole. Replace the dose if you accidentally swallow it. It will help if your mouth is not dry. Saliva around the tablet will help it to dissolve more quickly. Do not eat or drink, smoke or chew tobacco while a tablet is dissolving. Sit down when taking this medicine. In an angina attack, you should feel better within 5 minutes after your first dose. You can take a dose every 5 minutes up to a total of 3 doses. If you do not feel better or feel worse after 1 dose, call 9-1-1 at once. Do not take more than 3 doses in 15 minutes. Your health care provider might give you other directions. Follow those directions if he or she does. Do not take your medicine more often than directed. Talk to your health care provider about the use of this medicine in children. Special care may be needed. Overdosage: If you think you have taken too much of this medicine contact a poison control center or emergency room at once. NOTE: This medicine is only for you. Do not share this medicine with others. What if I miss a dose? This does not apply. This medicine is only used as needed. What may interact with this medicine? Do not take this medicine with any of the following medications: certain migraine medicines like ergotamine and dihydroergotamine (DHE) medicines used to treat erectile dysfunction like sildenafil, tadalafil, and vardenafil riociguat This medicine may also interact with the following medications: alteplase aspirin heparin medicines for high blood pressure medicines for mental depression other medicines used to treat angina phenothiazines like chlorpromazine, mesoridazine,  prochlorperazine, thioridazine This list may not describe all possible interactions. Give your health care provider a list of all the medicines, herbs, non-prescription drugs, or dietary supplements you use. Also tell them if you smoke, drink alcohol, or use illegal drugs. Some items may interact with your medicine. What should I watch for while using this medicine? Tell your doctor or health care professional if you feel your medicine is no longer working. Keep this medicine with you at all times. Sit or lie down when you take your medicine to prevent falling if you feel dizzy or faint after using it. Try to remain calm. This will help you to feel better faster. If you feel dizzy, take several deep breaths and lie down with your feet propped up, or bend forward with your head resting between your knees. You may get drowsy or dizzy. Do not drive, use machinery, or do anything that needs mental alertness until you know how this drug affects you. Do not stand or sit up quickly, especially if you are an older patient. This reduces the risk of dizzy or fainting spells. Alcohol can make you more drowsy and dizzy. Avoid alcoholic drinks. Do not treat yourself for coughs, colds, or pain while you are taking this medicine without asking your doctor or health care professional for advice. Some ingredients may increase your blood pressure. What side effects may I notice from receiving this medicine? Side effects that you should report to your doctor or health care professional as soon as possible: allergic reactions (skin rash, itching or hives; swelling of the face, lips, or tongue) low blood pressure (dizziness; feeling faint or lightheaded, falls; unusually weak or tired) low red blood cell counts (trouble breathing; feeling faint; lightheaded, falls; unusually weak or tired) Side effects that  usually do not require medical attention (report to your doctor or health care professional if they continue or are  bothersome): facial flushing (redness) headache nausea, vomiting This list may not describe all possible side effects. Call your doctor for medical advice about side effects. You may report side effects to FDA at 1-800-FDA-1088. Where should I keep my medicine? Keep out of the reach of children. Store at room temperature between 20 and 25 degrees C (68 and 77 degrees F). Store in Retail buyer. Protect from light and moisture. Keep tightly closed. Throw away any unused medicine after the expiration date. NOTE: This sheet is a summary. It may not cover all possible information. If you have questions about this medicine, talk to your doctor, pharmacist, or health care provider.  2021 Elsevier/Gold Standard (2018-08-10 16:46:32)+

## 2021-10-07 LAB — BASIC METABOLIC PANEL
BUN/Creatinine Ratio: 16 (ref 9–20)
BUN: 21 mg/dL (ref 6–24)
CO2: 27 mmol/L (ref 20–29)
Calcium: 8.7 mg/dL (ref 8.7–10.2)
Chloride: 102 mmol/L (ref 96–106)
Creatinine, Ser: 1.33 mg/dL — ABNORMAL HIGH (ref 0.76–1.27)
Glucose: 87 mg/dL (ref 70–99)
Potassium: 4.5 mmol/L (ref 3.5–5.2)
Sodium: 141 mmol/L (ref 134–144)
eGFR: 65 mL/min/{1.73_m2} (ref >59)

## 2021-10-07 LAB — CBC WITH DIFFERENTIAL/PLATELET
Basophils Absolute: 0 10*3/uL (ref 0.0–0.2)
Basos: 0 %
EOS (ABSOLUTE): 0.1 10*3/uL (ref 0.0–0.4)
Eos: 1 %
Hematocrit: 42.5 % (ref 37.5–51.0)
Hemoglobin: 14.8 g/dL (ref 13.0–17.7)
Immature Grans (Abs): 0 10*3/uL (ref 0.0–0.1)
Immature Granulocytes: 0 %
Lymphocytes Absolute: 1.6 10*3/uL (ref 0.7–3.1)
Lymphs: 31 %
MCH: 33.1 pg — ABNORMAL HIGH (ref 26.6–33.0)
MCHC: 34.8 g/dL (ref 31.5–35.7)
MCV: 95 fL (ref 79–97)
Monocytes Absolute: 0.6 10*3/uL (ref 0.1–0.9)
Monocytes: 12 %
Neutrophils Absolute: 2.9 10*3/uL (ref 1.4–7.0)
Neutrophils: 56 %
Platelets: 146 10*3/uL — ABNORMAL LOW (ref 150–450)
RBC: 4.47 x10E6/uL (ref 4.14–5.80)
RDW: 12.7 % (ref 11.6–15.4)
WBC: 5.2 10*3/uL (ref 3.4–10.8)

## 2021-10-13 ENCOUNTER — Telehealth: Payer: Self-pay

## 2021-10-13 NOTE — Telephone Encounter (Signed)
Attempted to call and review pre-procedure instructions with patient. Patient did not answer the phone. Will call back later today.  Cardiac catheterization scheduled at [facility] for:0730 am [facility] Main Entrance A [facility]) at:0530 am  No solid food after midnight prior to cath, clear liquids until 5 AM day of procedure.  Usual morning medications can be taken pre-cath with sips of water including aspirin 81 mg.    Confirmed patient has responsible adult to drive home post procedure and be with patient first 24 hours after arriving home.  [facility] does allow one visitor to accompany you and wait in the hospital waiting room while you are there for your procedure.  You and your visitor will be asked to wear a mask once you enter the hospital.  Patient reports does not currently have any new symptoms concerning for COVID-19 and no household members with COVID-19 like illness.

## 2021-10-14 ENCOUNTER — Encounter (HOSPITAL_COMMUNITY)
Admission: RE | Disposition: A | Discharge status: Other Acute Inpatient Hospital | Payer: Self-pay | Source: Home / Self Care | Attending: Interventional Cardiology

## 2021-10-14 ENCOUNTER — Other Ambulatory Visit: Payer: Self-pay

## 2021-10-14 ENCOUNTER — Inpatient Hospital Stay (HOSPITAL_COMMUNITY)
Admission: RE | Admit: 2021-10-14 | Discharge: 2021-10-15 | DRG: 287 | Payer: BC Managed Care – PPO | Attending: Interventional Cardiology | Admitting: Interventional Cardiology

## 2021-10-14 ENCOUNTER — Encounter (HOSPITAL_COMMUNITY): Payer: Self-pay | Admitting: Interventional Cardiology

## 2021-10-14 DIAGNOSIS — J45909 Unspecified asthma, uncomplicated: Secondary | ICD-10-CM | POA: Diagnosis present

## 2021-10-14 DIAGNOSIS — G4733 Obstructive sleep apnea (adult) (pediatric): Secondary | ICD-10-CM | POA: Diagnosis present

## 2021-10-14 DIAGNOSIS — I7300 Raynaud's syndrome without gangrene: Secondary | ICD-10-CM | POA: Diagnosis present

## 2021-10-14 DIAGNOSIS — Z20822 Contact with and (suspected) exposure to covid-19: Secondary | ICD-10-CM | POA: Diagnosis present

## 2021-10-14 DIAGNOSIS — R519 Headache, unspecified: Secondary | ICD-10-CM | POA: Diagnosis present

## 2021-10-14 DIAGNOSIS — J322 Chronic ethmoidal sinusitis: Secondary | ICD-10-CM | POA: Diagnosis present

## 2021-10-14 DIAGNOSIS — Z01810 Encounter for preprocedural cardiovascular examination: Secondary | ICD-10-CM | POA: Diagnosis not present

## 2021-10-14 DIAGNOSIS — K2210 Ulcer of esophagus without bleeding: Secondary | ICD-10-CM | POA: Diagnosis present

## 2021-10-14 DIAGNOSIS — I10 Essential (primary) hypertension: Secondary | ICD-10-CM | POA: Diagnosis present

## 2021-10-14 DIAGNOSIS — Z9109 Other allergy status, other than to drugs and biological substances: Secondary | ICD-10-CM

## 2021-10-14 DIAGNOSIS — G4700 Insomnia, unspecified: Secondary | ICD-10-CM | POA: Diagnosis present

## 2021-10-14 DIAGNOSIS — K449 Diaphragmatic hernia without obstruction or gangrene: Secondary | ICD-10-CM | POA: Diagnosis present

## 2021-10-14 DIAGNOSIS — Z888 Allergy status to other drugs, medicaments and biological substances status: Secondary | ICD-10-CM

## 2021-10-14 DIAGNOSIS — M3500 Sicca syndrome, unspecified: Secondary | ICD-10-CM | POA: Diagnosis present

## 2021-10-14 DIAGNOSIS — M329 Systemic lupus erythematosus, unspecified: Secondary | ICD-10-CM | POA: Diagnosis present

## 2021-10-14 DIAGNOSIS — Z7952 Long term (current) use of systemic steroids: Secondary | ICD-10-CM

## 2021-10-14 DIAGNOSIS — T380X5A Adverse effect of glucocorticoids and synthetic analogues, initial encounter: Secondary | ICD-10-CM | POA: Diagnosis present

## 2021-10-14 DIAGNOSIS — M351 Other overlap syndromes: Secondary | ICD-10-CM | POA: Diagnosis present

## 2021-10-14 DIAGNOSIS — I319 Disease of pericardium, unspecified: Secondary | ICD-10-CM | POA: Diagnosis present

## 2021-10-14 DIAGNOSIS — Q600 Renal agenesis, unilateral: Secondary | ICD-10-CM | POA: Diagnosis not present

## 2021-10-14 DIAGNOSIS — E291 Testicular hypofunction: Secondary | ICD-10-CM | POA: Diagnosis present

## 2021-10-14 DIAGNOSIS — D72819 Decreased white blood cell count, unspecified: Secondary | ICD-10-CM | POA: Diagnosis present

## 2021-10-14 DIAGNOSIS — J8410 Pulmonary fibrosis, unspecified: Secondary | ICD-10-CM | POA: Diagnosis present

## 2021-10-14 DIAGNOSIS — I200 Unstable angina: Secondary | ICD-10-CM | POA: Diagnosis present

## 2021-10-14 DIAGNOSIS — Z882 Allergy status to sulfonamides status: Secondary | ICD-10-CM

## 2021-10-14 DIAGNOSIS — J849 Interstitial pulmonary disease, unspecified: Secondary | ICD-10-CM | POA: Diagnosis not present

## 2021-10-14 DIAGNOSIS — Z825 Family history of asthma and other chronic lower respiratory diseases: Secondary | ICD-10-CM

## 2021-10-14 DIAGNOSIS — Z7982 Long term (current) use of aspirin: Secondary | ICD-10-CM

## 2021-10-14 DIAGNOSIS — R61 Generalized hyperhidrosis: Secondary | ICD-10-CM | POA: Diagnosis present

## 2021-10-14 DIAGNOSIS — F411 Generalized anxiety disorder: Secondary | ICD-10-CM | POA: Diagnosis present

## 2021-10-14 DIAGNOSIS — I2510 Atherosclerotic heart disease of native coronary artery without angina pectoris: Secondary | ICD-10-CM

## 2021-10-14 DIAGNOSIS — F909 Attention-deficit hyperactivity disorder, unspecified type: Secondary | ICD-10-CM | POA: Diagnosis present

## 2021-10-14 DIAGNOSIS — Z79899 Other long term (current) drug therapy: Secondary | ICD-10-CM

## 2021-10-14 DIAGNOSIS — F32A Depression, unspecified: Secondary | ICD-10-CM | POA: Diagnosis present

## 2021-10-14 DIAGNOSIS — Z885 Allergy status to narcotic agent status: Secondary | ICD-10-CM

## 2021-10-14 DIAGNOSIS — I25110 Atherosclerotic heart disease of native coronary artery with unstable angina pectoris: Secondary | ICD-10-CM | POA: Diagnosis present

## 2021-10-14 DIAGNOSIS — E785 Hyperlipidemia, unspecified: Secondary | ICD-10-CM | POA: Diagnosis present

## 2021-10-14 DIAGNOSIS — N529 Male erectile dysfunction, unspecified: Secondary | ICD-10-CM | POA: Diagnosis present

## 2021-10-14 DIAGNOSIS — K219 Gastro-esophageal reflux disease without esophagitis: Secondary | ICD-10-CM | POA: Diagnosis present

## 2021-10-14 DIAGNOSIS — F1590 Other stimulant use, unspecified, uncomplicated: Secondary | ICD-10-CM | POA: Diagnosis present

## 2021-10-14 DIAGNOSIS — M87188 Osteonecrosis due to drugs, other site: Secondary | ICD-10-CM | POA: Diagnosis present

## 2021-10-14 DIAGNOSIS — I700 Atherosclerosis of aorta: Secondary | ICD-10-CM | POA: Diagnosis present

## 2021-10-14 DIAGNOSIS — I209 Angina pectoris, unspecified: Secondary | ICD-10-CM

## 2021-10-14 DIAGNOSIS — Z86711 Personal history of pulmonary embolism: Secondary | ICD-10-CM

## 2021-10-14 DIAGNOSIS — Z8711 Personal history of peptic ulcer disease: Secondary | ICD-10-CM

## 2021-10-14 HISTORY — DX: Unstable angina: I20.0

## 2021-10-14 HISTORY — PX: LEFT HEART CATH AND CORONARY ANGIOGRAPHY: CATH118249

## 2021-10-14 LAB — BASIC METABOLIC PANEL
Anion gap: 9 (ref 5–15)
BUN: 23 mg/dL — ABNORMAL HIGH (ref 6–20)
CO2: 29 mmol/L (ref 22–32)
Calcium: 8.8 mg/dL — ABNORMAL LOW (ref 8.9–10.3)
Chloride: 100 mmol/L (ref 98–111)
Creatinine, Ser: 1.25 mg/dL — ABNORMAL HIGH (ref 0.61–1.24)
GFR, Estimated: >60 mL/min (ref >60)
Glucose, Bld: 91 mg/dL (ref 70–99)
Potassium: 4 mmol/L (ref 3.5–5.1)
Sodium: 138 mmol/L (ref 135–145)

## 2021-10-14 LAB — HEPARIN LEVEL (UNFRACTIONATED): Heparin Unfractionated: 0.17 IU/mL — ABNORMAL LOW (ref 0.30–0.70)

## 2021-10-14 LAB — SARS CORONAVIRUS 2 (TAT 6-24 HRS): SARS Coronavirus 2: NEGATIVE

## 2021-10-14 SURGERY — LEFT HEART CATH AND CORONARY ANGIOGRAPHY
Anesthesia: LOCAL

## 2021-10-14 MED ORDER — SODIUM CHLORIDE 0.9% FLUSH: 3.0000 mL | INTRAVENOUS | Status: DC | PRN

## 2021-10-14 MED ORDER — LIDOCAINE HCL (PF) 1 % IJ SOLN
INTRAMUSCULAR | Status: AC
  Filled 2021-10-14: qty 30

## 2021-10-14 MED ORDER — MIDAZOLAM HCL 2 MG/2ML IJ SOLN
INTRAMUSCULAR | Status: AC
  Filled 2021-10-14: qty 2

## 2021-10-14 MED ORDER — SODIUM CHLORIDE 0.9 % WEIGHT BASED INFUSION: 1.0000 mL/kg/h | INTRAVENOUS | Status: DC

## 2021-10-14 MED ORDER — HYDRALAZINE HCL 20 MG/ML IJ SOLN: 10.0000 mg | INTRAMUSCULAR | Status: AC | PRN

## 2021-10-14 MED ORDER — SODIUM CHLORIDE 0.9% FLUSH: 3.0000 mL | Freq: Two times a day (BID) | INTRAVENOUS | Status: DC

## 2021-10-14 MED ORDER — ALPRAZOLAM 0.5 MG PO TABS: 0.5000 mg | ORAL_TABLET | Freq: Every day | ORAL | Status: DC | PRN

## 2021-10-14 MED ORDER — SODIUM CHLORIDE 0.9 % WEIGHT BASED INFUSION
3.0000 mL/kg/h | INTRAVENOUS | Status: DC
  Administered 2021-10-14: 3 mL/kg/h via INTRAVENOUS

## 2021-10-14 MED ORDER — LABETALOL HCL 5 MG/ML IV SOLN: 10.0000 mg | INTRAVENOUS | Status: AC | PRN

## 2021-10-14 MED ORDER — HEPARIN SODIUM (PORCINE) 1000 UNIT/ML IJ SOLN
INTRAMUSCULAR | Status: DC | PRN
  Administered 2021-10-14: 4000 [IU] via INTRAVENOUS

## 2021-10-14 MED ORDER — SODIUM CHLORIDE 0.9 % IV SOLN: 250.0000 mL | INTRAVENOUS | Status: DC | PRN

## 2021-10-14 MED ORDER — ACETAMINOPHEN 325 MG PO TABS: 650.0000 mg | ORAL_TABLET | ORAL | Status: DC | PRN

## 2021-10-14 MED ORDER — ONDANSETRON HCL 4 MG/2ML IJ SOLN: 4.0000 mg | Freq: Four times a day (QID) | INTRAMUSCULAR | Status: DC | PRN

## 2021-10-14 MED ORDER — FENTANYL CITRATE (PF) 100 MCG/2ML IJ SOLN
INTRAMUSCULAR | Status: AC
  Filled 2021-10-14: qty 2

## 2021-10-14 MED ORDER — ASPIRIN EC 81 MG PO TBEC
81.0000 mg | DELAYED_RELEASE_TABLET | Freq: Every day | ORAL | Status: DC
  Administered 2021-10-15: 81 mg via ORAL
  Filled 2021-10-14: qty 1

## 2021-10-14 MED ORDER — HEPARIN (PORCINE) IN NACL 1000-0.9 UT/500ML-% IV SOLN
INTRAVENOUS | Status: AC
  Filled 2021-10-14: qty 500

## 2021-10-14 MED ORDER — MIDAZOLAM HCL 2 MG/2ML IJ SOLN
INTRAMUSCULAR | Status: DC | PRN
  Administered 2021-10-14: 2 mg via INTRAVENOUS

## 2021-10-14 MED ORDER — LIDOCAINE HCL (PF) 1 % IJ SOLN
INTRAMUSCULAR | Status: DC | PRN
  Administered 2021-10-14: 2 mL

## 2021-10-14 MED ORDER — HEPARIN (PORCINE) 25000 UT/250ML-% IV SOLN
1300.0000 [IU]/h | INTRAVENOUS | Status: DC
  Administered 2021-10-14: 1150 [IU]/h via INTRAVENOUS
  Administered 2021-10-15: 1300 [IU]/h via INTRAVENOUS
  Filled 2021-10-14 (×2): qty 250

## 2021-10-14 MED ORDER — NITROGLYCERIN 0.4 MG SL SUBL
0.4000 mg | SUBLINGUAL_TABLET | SUBLINGUAL | Status: DC | PRN
  Administered 2021-10-14 (×2): 0.4 mg via SUBLINGUAL
  Filled 2021-10-14: qty 1

## 2021-10-14 MED ORDER — SODIUM CHLORIDE 0.9% FLUSH
3.0000 mL | Freq: Two times a day (BID) | INTRAVENOUS | Status: DC
  Administered 2021-10-14: 3 mL via INTRAVENOUS

## 2021-10-14 MED ORDER — ASPIRIN 81 MG PO CHEW: 81.0000 mg | CHEWABLE_TABLET | ORAL | Status: DC

## 2021-10-14 MED ORDER — FENTANYL CITRATE (PF) 100 MCG/2ML IJ SOLN
INTRAMUSCULAR | Status: DC | PRN
  Administered 2021-10-14: 25 ug via INTRAVENOUS

## 2021-10-14 MED ORDER — ROSUVASTATIN CALCIUM 20 MG PO TABS
40.0000 mg | ORAL_TABLET | Freq: Every day | ORAL | Status: DC
  Administered 2021-10-14 – 2021-10-15 (×2): 40 mg via ORAL
  Filled 2021-10-14 (×2): qty 2

## 2021-10-14 MED ORDER — VERAPAMIL HCL 2.5 MG/ML IV SOLN
INTRAVENOUS | Status: AC
  Filled 2021-10-14: qty 2

## 2021-10-14 MED ORDER — ASPIRIN EC 81 MG PO TBEC: 81.0000 mg | DELAYED_RELEASE_TABLET | Freq: Every day | ORAL | Status: DC

## 2021-10-14 MED ORDER — IOHEXOL 350 MG/ML SOLN
INTRAVENOUS | Status: DC | PRN
  Administered 2021-10-14: 70 mL

## 2021-10-14 MED ORDER — PANTOPRAZOLE SODIUM 40 MG PO TBEC
40.0000 mg | DELAYED_RELEASE_TABLET | Freq: Every day | ORAL | Status: DC
  Administered 2021-10-15: 40 mg via ORAL
  Filled 2021-10-14: qty 1

## 2021-10-14 MED ORDER — HEPARIN (PORCINE) IN NACL 1000-0.9 UT/500ML-% IV SOLN
INTRAVENOUS | Status: DC | PRN
  Administered 2021-10-14 (×2): 500 mL

## 2021-10-14 MED ORDER — VERAPAMIL HCL 2.5 MG/ML IV SOLN
INTRAVENOUS | Status: DC | PRN
  Administered 2021-10-14: 10 mL via INTRA_ARTERIAL

## 2021-10-14 MED ORDER — SODIUM CHLORIDE 0.9 % IV SOLN: INTRAVENOUS | Status: AC

## 2021-10-14 SURGICAL SUPPLY — 9 items
CATH INFINITI 5FR MULTPACK ANG (CATHETERS) ×2 IMPLANT
DEVICE RAD TR BAND REGULAR (VASCULAR PRODUCTS) ×1 IMPLANT
GLIDESHEATH SLEND SS 6F .021 (SHEATH) ×1 IMPLANT
GUIDEWIRE INQWIRE 1.5J.035X260 (WIRE) IMPLANT
INQWIRE 1.5J .035X260CM (WIRE) ×2
KIT HEART LEFT (KITS) ×2 IMPLANT
PACK CARDIAC CATHETERIZATION (CUSTOM PROCEDURE TRAY) ×2 IMPLANT
TRANSDUCER W/STOPCOCK (MISCELLANEOUS) ×2 IMPLANT
TUBING CIL FLEX 10 FLL-RA (TUBING) ×2 IMPLANT

## 2021-10-14 NOTE — Interval H&P Note (Signed)
Cath Lab Visit (complete for each Cath Lab visit)  Clinical Evaluation Leading to the Procedure:   ACS: No.  Non-ACS:    Anginal Classification: CCS III  Anti-ischemic medical therapy: Minimal Therapy (1 class of medications)  Non-Invasive Test Results: No non-invasive testing performed  Prior CABG: No previous CABG      History and Physical Interval Note:  10/14/2021 7:36 AM  [name]  has presented today for surgery, with the diagnosis of angina.  The various methods of treatment have been discussed with the patient and family. After consideration of risks, benefits and other options for treatment, the patient has consented to  Procedure(s): LEFT HEART CATH AND CORONARY ANGIOGRAPHY (N/A) as a surgical intervention.  The patient's history has been reviewed, patient examined, no change in status, stable for surgery.  I have reviewed the patient's chart and labs.  Questions were answered to the patient's satisfaction.     Lance Muss

## 2021-10-14 NOTE — Progress Notes (Signed)
TR BAND REMOVAL  LOCATION:    radial right radial  DEFLATED PER PROTOCOL:   yes  TIME BAND OFF / DRESSING APPLIED:    1150/gauze and tegaderm  SITE UPON ARRIVAL:    Level 0  SITE AFTER BAND REMOVAL:    Level 0  CIRCULATION SENSATION AND MOVEMENT:    Within Normal Limits : yes, rt hand and fingers warm and pink, palpable rt radial, sensation present  COMMENTS:
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
## 2021-10-14 NOTE — Progress Notes (Signed)
ANTICOAGULATION CONSULT NOTE  Pharmacy Consult for heparin Indication: chest pain/ACS, s/p cath, likely CABG  Allergies  Allergen Reactions   Armoracia Rusticana Ext (Horseradish) Anaphylaxis, Hives and Shortness Of Breath   Other Hives    -Cocktail Sauce    Morphine And Related Itching   Trimethoprim Other (See Comments)    Kidney Toxicity     Rocephin [Ceftriaxone Sodium In Dextrose]     When taking over a longer period developed severe body aches, shaking, and headaches    Sulfamethoxazole Rash    Patient Measurements: Height: 5\' 7"  (170.2 cm) Weight: 81.6 kg (180 lb) IBW/kg (Calculated) : 66.1 Heparin Dosing Weight: 82kg  Vital Signs: Temp: 99.3 F (37.4 C) (11/22 2222) Temp Source: Oral (11/22 2222) BP: 106/64 (11/22 2244) Pulse Rate: 74 (11/22 2244)  Labs: Recent Labs    10/14/21 0615 10/14/21 2241  HEPARINUNFRC  --  0.17*  CREATININE 1.25*  --      Estimated Creatinine Clearance: 71.5 mL/min (A) (by C-G formula based on SCr of 1.25 mg/dL (H)).   Medical History: Past Medical History:  Diagnosis Date   Anxiety    Asthma    early adulthood/ exercise induced   Atherosclerosis of aorta (HCC) 03/26/2021   Attention deficit hyperactivity disorder (ADHD) 04/03/2008   Qualifier: Diagnosis of  By: 06/03/2008, CMA, Cindy     Avascular bone necrosis (HCC) 02/07/2020   Avascular necrosis (HCC)    of knees from long term steroid use   Backache 07/22/2007   Qualifier: Diagnosis of  By: 07/24/2007, CMA, Cindy     Bladder neck obstruction 03/23/2011   Bone pain    chronic   Chronic kidney disease    just 1 kidney congenital   Chronic steroid use 11/10/2013   Coffee ground emesis 11/10/2013   DEPRESSIVE DISORDER, RCR, MODERATE 06/30/2007   Qualifier: Diagnosis of  By: 08/30/2007 MD, Clent Ridges    DERMATOMYOSITIS 04/27/2007   Qualifier: Diagnosis of  By: 06/27/2007, CMA, Cindy     Diarrhea 11/10/2013   Esophageal ulcer    ETHMOID SINUSITIS 04/03/2008   Qualifier: Diagnosis  of  By: 06/03/2008, CMA, Cindy     Extremity pain 04/04/2013   Generalized anxiety disorder 04/03/2008   Qualifier: Diagnosis of  By: 06/03/2008, CMA, Cindy     GERD 04/03/2008   Qualifier: Diagnosis of  By: 06/03/2008, CMA, Cindy     GI bleed 11/09/2013   H/O long-term treatment with high-risk medication 02/22/2012   HEADACHE 10/01/2010   Qualifier: Diagnosis of  By: 13/07/2010 MD, Stephen A    Hematemesis 11/10/2013   Hiatal hernia    History of pulmonary embolism 10/18/2018   HYPERHIDROSIS 04/27/2007   Qualifier: Diagnosis of  By: 06/27/2007, CMA, Cindy     Hypogonadism, male 03/26/2009   Qualifier: Diagnosis of  By: 05/26/2009 MD, Sean A    Impotence of organic origin 03/01/2012   Insomnia    INTERSTITIAL LUNG DISEASE 04/27/2007   Qualifier: Diagnosis of  By: 06/27/2007, CMA, Cindy     LEUKOPENIA, CHRONIC 04/27/2007   Qualifier: Diagnosis of  By: 06/27/2007, CMA, Cindy     Low back pain    LUPUS 04/27/2007   Qualifier: Diagnosis of  By: 06/27/2007, CMA, Cindy     N&V (nausea and vomiting) 11/10/2013   Neck pain 12/19/2012   Obstructive sleep apnea 10/27/2016   Pain 02/25/2016   Pain syndrome, chronic 12/05/2012   PERICARDITIS 04/27/2007   Qualifier: Diagnosis of  By: 06/27/2007, CMA, Linna Darner  PITUITARY INSUFFICIENCY 03/26/2009   Qualifier: Diagnosis of  By: Loanne Drilling MD, Sean A    PLEURISY 04/27/2007   Qualifier: Diagnosis of  By: Sherlynn Stalls, CMA, Cindy     Presence of intrathecal pump 01/30/2020   Formatting of this note might be different from the original. Added automatically from request for surgery [id number]   Pulmonary embolism ([place]) 09/03/2011   Formatting of this note might be different from the original. somewhere between 2003-07 - took blood thinners for several months, then stopped   PULMONARY EMBOLISM, HX OF 04/27/2007   Qualifier: Diagnosis of  By: Sherlynn Stalls, CMA, Cindy     Pulmonary fibrosis ([place])    RAYNAUD'S DISEASE 05/28/2009   Qualifier: Diagnosis of  By: Niel Hummer MD, Lorinda Creed    Sjogren's syndrome [facility])     Solitary kidney, congenital 02/07/2020   Substance abuse ([place]) 12/06/2019   Seen 12/01/2019 at [facility] for hallucinations after ingestion of  meth taken 11/28/2019.    Assessment: Patient here for diagnostic left heart cath. Noted to have severe ostial LAD disease. Location not favorable for PCI, surgery is being consulted.   11/22 PM update:  Heparin level sub-therapeutic   Goal of Therapy:  Heparin level 0.3-0.7 units/ml Monitor platelets by anticoagulation protocol: Yes   Plan:  Inc heparin to 1300 units/hr Re-check heparin level in 8 hours Likely CABG soon  Narda Bonds, PharmD, BCPS Clinical Pharmacist Phone: [phone]

## 2021-10-14 NOTE — Progress Notes (Signed)
ANTICOAGULATION CONSULT NOTE - Initial Consult  Pharmacy Consult for heparin Indication: chest pain/ACS  Allergies  Allergen Reactions   Armoracia Rusticana Ext (Horseradish) Anaphylaxis, Hives and Shortness Of Breath   Other Hives    -Cocktail Sauce    Morphine And Related Itching   Trimethoprim Other (See Comments)    Kidney Toxicity     Rocephin [Ceftriaxone Sodium In Dextrose]     When taking over a longer period developed severe body aches, shaking, and headaches    Sulfamethoxazole Rash    Patient Measurements: Height: 5\' 7"  (170.2 cm) Weight: 81.6 kg (180 lb) IBW/kg (Calculated) : 66.1 Heparin Dosing Weight: 82kg  Vital Signs: Temp: 98.5 F (36.9 C) (11/22 0621) Temp Source: Oral (11/22 0621) BP: 102/57 (11/22 1250) Pulse Rate: 58 (11/22 1250)  Labs: Recent Labs    10/14/21 0615  CREATININE 1.25*    Estimated Creatinine Clearance: 71.5 mL/min (A) (by C-G formula based on SCr of 1.25 mg/dL (H)).   Medical History: Past Medical History:  Diagnosis Date   Anxiety    Asthma    early adulthood/ exercise induced   Atherosclerosis of aorta ([place]) 03/26/2021   Attention deficit hyperactivity disorder (ADHD) 04/03/2008   Qualifier: Diagnosis of  By: Sherlynn Stalls, CMA, Cindy     Avascular bone necrosis ([place]) 02/07/2020   Avascular necrosis ([place])    of knees from long term steroid use   Backache 07/22/2007   Qualifier: Diagnosis of  By: Sherlynn Stalls, CMA, Cindy     Bladder neck obstruction 03/23/2011   Bone pain    chronic   Chronic kidney disease    just 1 kidney congenital   Chronic steroid use 11/10/2013   Coffee ground emesis 11/10/2013   DEPRESSIVE DISORDER, RCR, MODERATE 06/30/2007   Qualifier: Diagnosis of  By: Sarajane Jews MD, Ishmael Holter    DERMATOMYOSITIS 04/27/2007   Qualifier: Diagnosis of  By: Sherlynn Stalls, CMA, Cindy     Diarrhea 11/10/2013   Esophageal ulcer    ETHMOID SINUSITIS 04/03/2008   Qualifier: Diagnosis of  By: Sherlynn Stalls, CMA, Cindy     Extremity pain  04/04/2013   Generalized anxiety disorder 04/03/2008   Qualifier: Diagnosis of  By: Sherlynn Stalls, CMA, Cindy     GERD 04/03/2008   Qualifier: Diagnosis of  By: Sherlynn Stalls, CMA, Cindy     GI bleed 11/09/2013   H/O long-term treatment with high-risk medication 02/22/2012   HEADACHE 10/01/2010   Qualifier: Diagnosis of  By: Sarajane Jews MD, Stephen A    Hematemesis 11/10/2013   Hiatal hernia    History of pulmonary embolism 10/18/2018   HYPERHIDROSIS 04/27/2007   Qualifier: Diagnosis of  By: Sherlynn Stalls, CMA, Cindy     Hypogonadism, male 03/26/2009   Qualifier: Diagnosis of  By: Loanne Drilling MD, Sean A    Impotence of organic origin 03/01/2012   Insomnia    INTERSTITIAL LUNG DISEASE 04/27/2007   Qualifier: Diagnosis of  By: Sherlynn Stalls, CMA, [place], CHRONIC 04/27/2007   Qualifier: Diagnosis of  By: Sherlynn Stalls, CMA, Cindy     Low back pain    LUPUS 04/27/2007   Qualifier: Diagnosis of  By: Sherlynn Stalls, CMA, Cindy     N&V (nausea and vomiting) 11/10/2013   Neck pain 12/19/2012   Obstructive sleep apnea 10/27/2016   Pain 02/25/2016   Pain syndrome, chronic 12/05/2012   PERICARDITIS 04/27/2007   Qualifier: Diagnosis of  By: Sherlynn Stalls CMA, Cindy     PITUITARY INSUFFICIENCY 03/26/2009   Qualifier: Diagnosis of  By: Loanne Drilling MD,  Gregary Signs A    PLEURISY 04/27/2007   Qualifier: Diagnosis of  By: Linna Darner, CMA, Cindy     Presence of intrathecal pump 01/30/2020   Formatting of this note might be different from the original. Added automatically from request for surgery [id number]   Pulmonary embolism (HCC) 09/03/2011   Formatting of this note might be different from the original. somewhere between 2003-07 - took blood thinners for several months, then stopped   PULMONARY EMBOLISM, HX OF 04/27/2007   Qualifier: Diagnosis of  By: Linna Darner, CMA, Cindy     Pulmonary fibrosis (HCC)    RAYNAUD'S DISEASE 05/28/2009   Qualifier: Diagnosis of  By: Alphonzo Severance MD, Loni Dolly    Sjogren's syndrome [facility])    Solitary kidney, congenital 02/07/2020    Substance abuse (HCC) 12/06/2019   Seen 12/01/2019 at [facility] for hallucinations after ingestion of  meth taken 11/28/2019.    Assessment: Patient here for diagnostic left heart cath. Noted to have severe ostial LAD disease. Location not favorable for PCI, surgery is being consulted.   Goal of Therapy:  Heparin level 0.3-0.7 units/ml Monitor platelets by anticoagulation protocol: Yes   Plan:  Start heparin infusion at 1150 units/hr Check anti-Xa level in 6 hours and daily while on heparin Continue to monitor H&H and platelets  Sheppard Coil PharmD., BCPS Clinical Pharmacist 10/14/2021 1:26 PM

## 2021-10-15 ENCOUNTER — Inpatient Hospital Stay (HOSPITAL_COMMUNITY): Payer: BC Managed Care – PPO

## 2021-10-15 ENCOUNTER — Encounter (HOSPITAL_COMMUNITY): Payer: Self-pay | Admitting: Interventional Cardiology

## 2021-10-15 ENCOUNTER — Other Ambulatory Visit (HOSPITAL_COMMUNITY): Payer: BC Managed Care – PPO

## 2021-10-15 DIAGNOSIS — I2510 Atherosclerotic heart disease of native coronary artery without angina pectoris: Secondary | ICD-10-CM

## 2021-10-15 DIAGNOSIS — I200 Unstable angina: Secondary | ICD-10-CM

## 2021-10-15 DIAGNOSIS — E785 Hyperlipidemia, unspecified: Secondary | ICD-10-CM

## 2021-10-15 DIAGNOSIS — Z01810 Encounter for preprocedural cardiovascular examination: Secondary | ICD-10-CM

## 2021-10-15 HISTORY — DX: Hyperlipidemia, unspecified: E78.5

## 2021-10-15 HISTORY — DX: Atherosclerotic heart disease of native coronary artery without angina pectoris: I25.10

## 2021-10-15 LAB — BASIC METABOLIC PANEL
Anion gap: 6 (ref 5–15)
BUN: 16 mg/dL (ref 6–20)
CO2: 27 mmol/L (ref 22–32)
Calcium: 8.7 mg/dL — ABNORMAL LOW (ref 8.9–10.3)
Chloride: 105 mmol/L (ref 98–111)
Creatinine, Ser: 1.15 mg/dL (ref 0.61–1.24)
GFR, Estimated: >60 mL/min (ref >60)
Glucose, Bld: 97 mg/dL (ref 70–99)
Potassium: 4.2 mmol/L (ref 3.5–5.1)
Sodium: 138 mmol/L (ref 135–145)

## 2021-10-15 LAB — CBC
HCT: 40.9 % (ref 39.0–52.0)
Hemoglobin: 13.6 g/dL (ref 13.0–17.0)
MCH: 32.5 pg (ref 26.0–34.0)
MCHC: 33.3 g/dL (ref 30.0–36.0)
MCV: 97.6 fL (ref 80.0–100.0)
Platelets: 133 10*3/uL — ABNORMAL LOW (ref 150–400)
RBC: 4.19 MIL/uL — ABNORMAL LOW (ref 4.22–5.81)
RDW: 13.2 % (ref 11.5–15.5)
WBC: 4.5 10*3/uL (ref 4.0–10.5)
nRBC: 0 % (ref 0.0–0.2)

## 2021-10-15 LAB — ECHOCARDIOGRAM COMPLETE
Area-P 1/2: 2.53 cm2
Height: 67 in
S' Lateral: 2.9 cm
Weight: 2880 oz

## 2021-10-15 LAB — HEPARIN LEVEL (UNFRACTIONATED): Heparin Unfractionated: 0.47 IU/mL (ref 0.30–0.70)

## 2021-10-15 MED ORDER — TRANEXAMIC ACID (OHS) PUMP PRIME SOLUTION
2.0000 mg/kg | INTRAVENOUS | Status: DC
  Filled 2021-10-15: qty 1.63

## 2021-10-15 MED ORDER — DEXMEDETOMIDINE HCL IN NACL 400 MCG/100ML IV SOLN
0.1000 ug/kg/h | INTRAVENOUS | Status: DC
Start: 2021-10-17 — End: 2021-10-16
  Filled 2021-10-15: qty 100

## 2021-10-15 MED ORDER — EPINEPHRINE HCL 5 MG/250ML IV SOLN IN NS
0.0000 ug/min | INTRAVENOUS | Status: DC
Start: 2021-10-17 — End: 2021-10-16
  Filled 2021-10-15: qty 250

## 2021-10-15 MED ORDER — TRANEXAMIC ACID 1000 MG/10ML IV SOLN
1.5000 mg/kg/h | INTRAVENOUS | Status: DC
  Filled 2021-10-15: qty 25

## 2021-10-15 MED ORDER — CEFAZOLIN SODIUM-DEXTROSE 2-4 GM/100ML-% IV SOLN
2.0000 g | INTRAVENOUS | Status: DC
  Filled 2021-10-15: qty 100

## 2021-10-15 MED ORDER — PANTOPRAZOLE SODIUM 40 MG PO TBEC: 40.0000 mg | DELAYED_RELEASE_TABLET | Freq: Every day | ORAL | Status: DC

## 2021-10-15 MED ORDER — ROSUVASTATIN CALCIUM 40 MG PO TABS: 40.0000 mg | ORAL_TABLET | Freq: Every day | ORAL | Status: DC

## 2021-10-15 MED ORDER — NOREPINEPHRINE 4 MG/250ML-% IV SOLN
0.0000 ug/min | INTRAVENOUS | Status: DC
  Filled 2021-10-15: qty 250

## 2021-10-15 MED ORDER — TRANEXAMIC ACID (OHS) BOLUS VIA INFUSION
15.0000 mg/kg | INTRAVENOUS | Status: DC
  Filled 2021-10-15: qty 1224

## 2021-10-15 MED ORDER — ONDANSETRON HCL 4 MG/2ML IJ SOLN: 4.0000 mg | Freq: Four times a day (QID) | INTRAMUSCULAR | 0 refills | Status: DC | PRN

## 2021-10-15 MED ORDER — NITROGLYCERIN IN D5W 200-5 MCG/ML-% IV SOLN: 2.0000 ug/min | INTRAVENOUS | Status: DC

## 2021-10-15 MED ORDER — ASPIRIN 81 MG PO TBEC: 81.0000 mg | DELAYED_RELEASE_TABLET | Freq: Every day | ORAL | 11 refills | Status: AC

## 2021-10-15 MED ORDER — HEPARIN (PORCINE) 25000 UT/250ML-% IV SOLN: 1300.0000 [IU]/h | INTRAVENOUS | Status: DC

## 2021-10-15 MED ORDER — HEPARIN 30,000 UNITS/1000 ML (OHS) CELLSAVER SOLUTION
Status: DC
  Filled 2021-10-15: qty 1000

## 2021-10-15 MED ORDER — MILRINONE LACTATE IN DEXTROSE 20-5 MG/100ML-% IV SOLN
0.3000 ug/kg/min | INTRAVENOUS | Status: DC
  Filled 2021-10-15: qty 100

## 2021-10-15 MED ORDER — ACETAMINOPHEN 325 MG PO TABS: 650.0000 mg | ORAL_TABLET | ORAL | Status: DC | PRN

## 2021-10-15 MED ORDER — MANNITOL 20 % IV SOLN
INTRAVENOUS | Status: DC
  Filled 2021-10-15: qty 13

## 2021-10-15 MED ORDER — PLASMA-LYTE A IV SOLN
INTRAVENOUS | Status: DC
  Filled 2021-10-15: qty 5

## 2021-10-15 MED ORDER — ALPRAZOLAM 0.5 MG PO TABS: 0.5000 mg | ORAL_TABLET | Freq: Every day | ORAL | 0 refills | Status: DC | PRN

## 2021-10-15 MED ORDER — VANCOMYCIN HCL 1250 MG/250ML IV SOLN
1250.0000 mg | INTRAVENOUS | Status: DC
  Filled 2021-10-15: qty 250

## 2021-10-15 MED ORDER — NITROGLYCERIN IN D5W 200-5 MCG/ML-% IV SOLN
2.0000 ug/min | INTRAVENOUS | Status: DC
  Filled 2021-10-15: qty 250

## 2021-10-15 MED ORDER — NITROGLYCERIN IN D5W 200-5 MCG/ML-% IV SOLN
0.0000 ug/min | INTRAVENOUS | Status: DC
Start: 2021-10-15 — End: 2021-10-16
  Administered 2021-10-15: 5 ug/min via INTRAVENOUS
  Filled 2021-10-15: qty 250

## 2021-10-15 MED ORDER — NITROGLYCERIN 0.4 MG SL SUBL: 0.4000 mg | SUBLINGUAL_TABLET | SUBLINGUAL | 12 refills | Status: DC | PRN

## 2021-10-15 MED ORDER — POTASSIUM CHLORIDE 2 MEQ/ML IV SOLN
80.0000 meq | INTRAVENOUS | Status: DC
  Filled 2021-10-15: qty 40

## 2021-10-15 MED ORDER — INSULIN REGULAR(HUMAN) IN NACL 100-0.9 UT/100ML-% IV SOLN
INTRAVENOUS | Status: DC
  Filled 2021-10-15: qty 100

## 2021-10-15 MED ORDER — SODIUM CHLORIDE 0.9 % IV SOLN: 250.0000 mL | INTRAVENOUS | 0 refills | Status: DC | PRN

## 2021-10-15 MED ORDER — PHENYLEPHRINE HCL-NACL 20-0.9 MG/250ML-% IV SOLN
30.0000 ug/min | INTRAVENOUS | Status: DC
Start: 2021-10-17 — End: 2021-10-16
  Filled 2021-10-15: qty 250

## 2021-10-15 NOTE — Progress Notes (Signed)
Notified Nada Boozer NP pt was cont to have mild CP. States has been continuous and unchanged, but has not complained about it. New orders to be placed for IV Ntg. Cont to monitor. Emelda Brothers RN

## 2021-10-15 NOTE — Progress Notes (Signed)
Spoke with pt to let him know we have a bed a [facility] and he wants to speak to Dr. Cliffton Asters about being transferred. The pt does not really want to be transferred to [facility] and wants to remain here for CABG. DR. Cliffton Asters paged. Awaiting return call. Emelda Brothers RN

## 2021-10-15 NOTE — Discharge Summary (Signed)
Discharge Summary    Patient ID: [name] MRN: [id number]; DOB: 11/24/1970  Admit date: 10/14/2021 Discharge date: 10/15/2021  PCP:  Laurey Morale, MD   [facility] Providers Cardiologist:  Jenean Lindau, MD        Discharge Diagnoses    Principal Problem:   Unstable angina [facility]) Active Problems:   RAYNAUD'S DISEASE   OSA on CPAP   Pulmonary fibrosis ([place])   Sjogren's syndrome ([place])   CAD (coronary artery disease) severe LAD disease need for bypass   HLD (hyperlipidemia)   Diagnostic Studies/Procedures     Cardiac Cath 10/14/21  ____    Mid RCA lesion is 25% stenosed.   Ost LAD to Prox LAD lesion is 80% stenosed.   Ost Cx to Prox Cx lesion is 25% stenosed.   Mid LAD-1 lesion is 25% stenosed.   Mid LAD-2 lesion is 25% stenosed.   The left ventricular systolic function is normal.   LV end diastolic pressure is normal.   The left ventricular ejection fraction is 55-65% by visual estimate.   There is no aortic valve stenosis.   Severe ostial LAD disease.  Anatomy unfavorable for PCI.  Given his chest discomfort at rest, will admit and get surgical consult.  May be a candidate for robotic LIMA to LAD.   Will start IV heparin 8 hours post sheath pull.   Long-term, he will need aggressive secondary prevention including lipid-lowering therapy.      _________   ECHO  IMPRESSIONS     1. Left ventricular ejection fraction, by estimation, is 60 to 65%. The  left ventricle has normal function. The left ventricle has no regional  wall motion abnormalities. There is mild left ventricular hypertrophy.  Left ventricular diastolic parameters  were normal.   2. Right ventricular systolic function is normal. The right ventricular  size is normal. Tricuspid regurgitation signal is inadequate for assessing  PA pressure.   3. The mitral valve is normal in structure. No evidence of mitral valve  regurgitation.   4. The aortic valve is tricuspid. Aortic  valve regurgitation is not  visualized. No aortic stenosis is present.   5. The inferior vena cava is normal in size with greater than 50%  respiratory variability, suggesting right atrial pressure of 3 mmHg.   FINDINGS   Left Ventricle: Left ventricular ejection fraction, by estimation, is 60  to 65%. The left ventricle has normal function. The left ventricle has no  regional wall motion abnormalities. 3D left ventricular ejection fraction  analysis performed but not  reported based on interpreter judgement due to suboptimal tracking. The  left ventricular internal cavity size was normal in size. There is mild  left ventricular hypertrophy. Left ventricular diastolic parameters were  normal.   Right Ventricle: The right ventricular size is normal. No increase in  right ventricular wall thickness. Right ventricular systolic function is  normal. Tricuspid regurgitation signal is inadequate for assessing PA  pressure.   Left Atrium: Left atrial size was normal in size.   Right Atrium: Right atrial size was normal in size.   Pericardium: There is no evidence of pericardial effusion.   Mitral Valve: The mitral valve is normal in structure. No evidence of  mitral valve regurgitation.   Tricuspid Valve: The tricuspid valve is normal in structure. Tricuspid  valve regurgitation is trivial.   Aortic Valve: The aortic valve is tricuspid. Aortic valve regurgitation is  not visualized. No aortic stenosis is present.   Pulmonic  Valve: The pulmonic valve was grossly normal. Pulmonic valve  regurgitation is trivial.   Aorta: The aortic root and ascending aorta are structurally normal, with  no evidence of dilitation.   Venous: The inferior vena cava is normal in size with greater than 50%  respiratory variability, suggesting right atrial pressure of 3 mmHg.   IAS/Shunts: The interatrial septum was not well visualized.      LEFT VENTRICLE  PLAX 2D  LVIDd:         4.40 cm    Diastology  LVIDs:         2.90 cm   LV e' medial:    8.03 cm/s  LV PW:         0.90 cm   LV E/e' medial:  7.5  LV IVS:        1.00 cm   LV e' lateral:   12.40 cm/s  LVOT diam:     2.20 cm   LV E/e' lateral: 4.9  LV SV:         62  LV SV Index:   32  LVOT Area:     3.80 cm                              3D Volume EF:                           3D EF:        57 %                           LV EDV:       140 ml                           LV ESV:       60 ml                           LV SV:        80 ml   RIGHT VENTRICLE  RV S prime:     12.10 cm/s  TAPSE (M-mode): 1.5 cm   LEFT ATRIUM             Index        RIGHT ATRIUM           Index  LA diam:        3.70 cm 1.91 cm/m   RA Area:     10.10 cm  LA Vol (A2C):   22.1 ml 11.43 ml/m  RA Volume:   21.00 ml  10.86 ml/m  LA Vol (A4C):   26.3 ml 13.60 ml/m  LA Biplane Vol: 26.2 ml 13.55 ml/m   AORTIC VALVE  LVOT Vmax:   84.20 cm/s  LVOT Vmean:  52.300 cm/s  LVOT VTI:    0.163 m     AORTA  Ao Root diam: 2.80 cm  Ao Asc diam:  3.00 cm   MITRAL VALVE  MV Area (PHT): 2.53 cm    SHUNTS  MV Decel Time: 300 msec    Systemic VTI:  0.16 m  MV E velocity: 60.40 cm/s  Systemic Diam: 2.20 cm  MV A velocity: 51.60 cm/s  MV E/A ratio:  1.17   Oswaldo Milian MD  Electronically signed by Oswaldo Milian MD  Signature Date/Time: 10/15/2021/3:07:43 PM       Examination Guidelines: A complete evaluation includes B-mode imaging,  spectral  Doppler, color Doppler, and power Doppler as needed of all accessible  portions  of each vessel. Bilateral testing is considered an integral part of a  complete  examination. Limited examinations for reoccurring indications may be  performed  as noted.      Right Carotid Findings:  +----------+--------+--------+--------+------------+--------+            PSV cm/sEDV cm/sStenosisDescribe    Comments  +----------+--------+--------+--------+------------+--------+  CCA Prox  97       22              heterogenous          +----------+--------+--------+--------+------------+--------+  CCA Distal104     28              heterogenous          +----------+--------+--------+--------+------------+--------+  ICA Prox  63      25      Normal  heterogenous          +----------+--------+--------+--------+------------+--------+  ICA Distal97      36                                    +----------+--------+--------+--------+------------+--------+  ECA       121     23                                    +----------+--------+--------+--------+------------+--------+   +----------+--------+-------+--------+------------+            PSV cm/sEDV cmsDescribeArm Pressure  +----------+--------+-------+--------+------------+  Subclavian93                                   +----------+--------+-------+--------+------------+   +---------+--------+--+--------+-+---------+  VertebralPSV cm/s26EDV cm/s8Antegrade  +---------+--------+--+--------+-+---------+   Left Carotid Findings:  +----------+--------+--------+--------+------------+--------+            PSV cm/sEDV cm/sStenosisDescribe    Comments  +----------+--------+--------+--------+------------+--------+  CCA Prox  105     24              heterogenous          +----------+--------+--------+--------+------------+--------+  CCA Distal83      27              heterogenous          +----------+--------+--------+--------+------------+--------+  ICA Prox  71      28      Normal  heterogenous          +----------+--------+--------+--------+------------+--------+  ICA Distal94      38                                    +----------+--------+--------+--------+------------+--------+  ECA       99      22                                    +----------+--------+--------+--------+------------+--------+     +----------+--------+--------+--------+------------+  SubclavianPSV cm/sEDV cm/sDescribeArm Pressure  +----------+--------+--------+--------+------------+            98                                    +----------+--------+--------+--------+------------+   +---------+--------+--+--------+--+---------+  VertebralPSV cm/s60EDV cm/s18Antegrade  +---------+--------+--+--------+--+---------+      ABI Findings:  +--------+------------------+-----+---------+--------+  Right   Rt Pressure (mmHg)IndexWaveform Comment   +--------+------------------+-----+---------+--------+  Brachial                       triphasic          +--------+------------------+-----+---------+--------+  ATA                            triphasic          +--------+------------------+-----+---------+--------+  PTA                            triphasic          +--------+------------------+-----+---------+--------+   +--------+------------------+-----+---------+-------+  Left    Lt Pressure (mmHg)IndexWaveform Comment  +--------+------------------+-----+---------+-------+  Brachial                       triphasic         +--------+------------------+-----+---------+-------+  ATA                            triphasic         +--------+------------------+-----+---------+-------+  PTA                            triphasic         +--------+------------------+-----+---------+-------+       Right Doppler Findings:  +--------+--------+-----+---------+--------+  Site    PressureIndexDoppler  Comments  +--------+--------+-----+---------+--------+  Brachial             triphasic          +--------+--------+-----+---------+--------+  Radial               triphasic          +--------+--------+-----+---------+--------+  Ulnar                biphasic           +--------+--------+-----+---------+--------+       Left Doppler  Findings:  +--------+--------+-----+---------+--------+  Site    PressureIndexDoppler  Comments  +--------+--------+-----+---------+--------+  Brachial             triphasic          +--------+--------+-----+---------+--------+  Radial               triphasic          +--------+--------+-----+---------+--------+  Ulnar                triphasic          +--------+--------+-----+---------+--------+     Dopplers 10/15/21  Summary:  Right Carotid: There was no evidence of thrombus, dissection,  atherosclerotic                 plaque or stenosis in the cervical carotid system.   Left Carotid: There was no evidence of thrombus, dissection,  atherosclerotic                plaque or stenosis in the cervical carotid system.  Vertebrals: Bilateral vertebral arteries demonstrate antegrade flow.   Right Upper Extremity: Doppler waveform obliterate with right radial  compression. Doppler waveforms remain within normal limits with right  ulnar compression.  Left Upper Extremity: Doppler waveforms remain within normal limits with  left radial compression. Doppler waveforms remain  within normal limits  with left ulnar compression.     History of Present Illness     [name] is a 50 y.o. male with hx of systemic lupus erythematosus, history of solitary kidney, essential hypertension and CT in past revealed aortic and coronary atherosclerosis.  Chest pain with exertion.  No dyspnea.  He is followed at [facility] for overlap syndrome Lupus see problem list.   He has chronic bone pain from AVN and has a pain pump.     He was seen in the office 10/06/21 and planned for outpt cardiac cath.  He presented 10/14/21 and underwent cardiac cath. Did well post op.  Admitted for surgical consult.    Hospital Course     Consultants: Dr. Kipp Brood   Pt was seen 10/15/21 by Dr. Harrington Challenger and with 80% ostial LAD lesion not optimal for PTCA/Stent plan for CABG.  Dr. Harrington Challenger discussed with  Dr. Kipp Brood and with pts overlap rheum disorders treated at [facility] is was felt transfer to [facility] was best for pt.  Pt also has hx of pericarditis in past as well consider colchicine to prevent this in post op period.  Other hx of pulmonary fibrosis.  Pt has solitary kidney and Cr stable post cath.  Pt did have pain today and NTG IV added he is on IV heparin as well.    Dr. Harrington Challenger arranged transfer to [facility] for CABG.  Dr. Laddie Aquas accepted pt.  He was stable on above meds.  Did the patient have an acute coronary syndrome (MI, NSTEMI, STEMI, etc) this admission?:  No                               Did the patient have a percutaneous coronary intervention (stent / angioplasty)?:  No.          _____________  Discharge Vitals Blood pressure 127/81, pulse 78, temperature 97.6 F (36.4 C), temperature source Oral, resp. rate 18, height 5\' 7"  (1.702 m), weight 81.6 kg, SpO2 98 %.  Filed Weights   10/14/21 0621  Weight: 81.6 kg    Labs & Radiologic Studies    CBC Recent Labs    10/15/21 0241  WBC 4.5  HGB 13.6  HCT 40.9  MCV 97.6  PLT [id number]*   Basic Metabolic Panel Recent Labs    10/14/21 0615 10/15/21 0241  NA 138 138  K 4.0 4.2  CL 100 105  CO2 29 27  GLUCOSE 91 97  BUN 23* 16  CREATININE 1.25* 1.15  CALCIUM 8.8* 8.7*   Liver Function Tests No results for input(s): AST, ALT, ALKPHOS, BILITOT, PROT, ALBUMIN in the last 72 hours. No results for input(s): LIPASE, AMYLASE in the last 72 hours. High Sensitivity Troponin:   No results for input(s): TROPONINIHS in the last 720 hours.  BNP Invalid input(s): POCBNP D-Dimer No results for input(s): DDIMER in the last 72 hours. Hemoglobin A1C No results for input(s): HGBA1C in the last 72 hours. Fasting Lipid Panel No results for input(s): CHOL, HDL, LDLCALC, TRIG, CHOLHDL, LDLDIRECT in the last 72 hours. Thyroid Function Tests No results for input(s): TSH, T4TOTAL, T3FREE, THYROIDAB in the last 72 hours.  Invalid  input(s): FREET3 _____________  CARDIAC CATHETERIZATION  Result Date: 10/14/2021   Mid RCA lesion is 25% stenosed.   Ost LAD to Prox LAD lesion is 80% stenosed.   Ost Cx to Prox Cx lesion is 25% stenosed.   Mid  LAD-1 lesion is 25% stenosed.   Mid LAD-2 lesion is 25% stenosed.   The left ventricular systolic function is normal.   LV end diastolic pressure is normal.   The left ventricular ejection fraction is 55-65% by visual estimate.   There is no aortic valve stenosis. Severe ostial LAD disease.  Anatomy unfavorable for PCI.  Given his chest discomfort at rest, will admit and get surgical consult.  May be a candidate for robotic LIMA to LAD. Will start IV heparin 8 hours post sheath pull. Long-term, he will need aggressive secondary prevention including lipid-lowering therapy.   ECHOCARDIOGRAM COMPLETE  Result Date: 10/15/2021    ECHOCARDIOGRAM REPORT   Patient Name:   TEREL BANN Date of Exam: 10/15/2021 Medical Rec #:  [id number]            Height:       67.0 in Accession #:    [id number]           Weight:       180.0 lb Date of Birth:  [birth year]            BSA:          1.934 m Patient Age:    50 years             BP:           110/74 mmHg Patient Gender: M                    HR:           79 bpm. Exam Location:  Inpatient Procedure: 2D Echo, 3D Echo, Cardiac Doppler, Color Doppler and Strain Analysis Indications:    CAD Native Vessel I25.10  History:        Patient has no prior history of Echocardiogram examinations.                 Risk Factors:Hypertension. History of pericarditis.  Sonographer:    Leta Jungling RDCS Referring Phys: [id number] HARRELL O LIGHTFOOT IMPRESSIONS  1. Left ventricular ejection fraction, by estimation, is 60 to 65%. The left ventricle has normal function. The left ventricle has no regional wall motion abnormalities. There is mild left ventricular hypertrophy. Left ventricular diastolic parameters were normal.  2. Right ventricular systolic function is normal. The  right ventricular size is normal. Tricuspid regurgitation signal is inadequate for assessing PA pressure.  3. The mitral valve is normal in structure. No evidence of mitral valve regurgitation.  4. The aortic valve is tricuspid. Aortic valve regurgitation is not visualized. No aortic stenosis is present.  5. The inferior vena cava is normal in size with greater than 50% respiratory variability, suggesting right atrial pressure of 3 mmHg. FINDINGS  Left Ventricle: Left ventricular ejection fraction, by estimation, is 60 to 65%. The left ventricle has normal function. The left ventricle has no regional wall motion abnormalities. 3D left ventricular ejection fraction analysis performed but not reported based on interpreter judgement due to suboptimal tracking. The left ventricular internal cavity size was normal in size. There is mild left ventricular hypertrophy. Left ventricular diastolic parameters were normal. Right Ventricle: The right ventricular size is normal. No increase in right ventricular wall thickness. Right ventricular systolic function is normal. Tricuspid regurgitation signal is inadequate for assessing PA pressure. Left Atrium: Left atrial size was normal in size. Right Atrium: Right atrial size was normal in size. Pericardium: There is no evidence of pericardial effusion. Mitral Valve: The mitral valve is normal  in structure. No evidence of mitral valve regurgitation. Tricuspid Valve: The tricuspid valve is normal in structure. Tricuspid valve regurgitation is trivial. Aortic Valve: The aortic valve is tricuspid. Aortic valve regurgitation is not visualized. No aortic stenosis is present. Pulmonic Valve: The pulmonic valve was grossly normal. Pulmonic valve regurgitation is trivial. Aorta: The aortic root and ascending aorta are structurally normal, with no evidence of dilitation. Venous: The inferior vena cava is normal in size with greater than 50% respiratory variability, suggesting right atrial  pressure of 3 mmHg. IAS/Shunts: The interatrial septum was not well visualized.  LEFT VENTRICLE PLAX 2D LVIDd:         4.40 cm   Diastology LVIDs:         2.90 cm   LV e' medial:    8.03 cm/s LV PW:         0.90 cm   LV E/e' medial:  7.5 LV IVS:        1.00 cm   LV e' lateral:   12.40 cm/s LVOT diam:     2.20 cm   LV E/e' lateral: 4.9 LV SV:         62 LV SV Index:   32 LVOT Area:     3.80 cm                           3D Volume EF:                          3D EF:        57 %                          LV EDV:       140 ml                          LV ESV:       60 ml                          LV SV:        80 ml RIGHT VENTRICLE RV S prime:     12.10 cm/s TAPSE (M-mode): 1.5 cm LEFT ATRIUM             Index        RIGHT ATRIUM           Index LA diam:        3.70 cm 1.91 cm/m   RA Area:     10.10 cm LA Vol (A2C):   22.1 ml 11.43 ml/m  RA Volume:   21.00 ml  10.86 ml/m LA Vol (A4C):   26.3 ml 13.60 ml/m LA Biplane Vol: 26.2 ml 13.55 ml/m  AORTIC VALVE LVOT Vmax:   84.20 cm/s LVOT Vmean:  52.300 cm/s LVOT VTI:    0.163 m  AORTA Ao Root diam: 2.80 cm Ao Asc diam:  3.00 cm MITRAL VALVE MV Area (PHT): 2.53 cm    SHUNTS MV Decel Time: 300 msec    Systemic VTI:  0.16 m MV E velocity: 60.40 cm/s  Systemic Diam: 2.20 cm MV A velocity: 51.60 cm/s MV E/A ratio:  1.17 Oswaldo Milian MD Electronically signed by Oswaldo Milian MD Signature Date/Time: 10/15/2021/3:07:43 PM    Final    VAS US DOPPLER PRE CABG  Result Date: 10/15/2021 PREOPERATIVE VASCULAR EVALUATION Patient Name:  MILLER GRIMMETT  Date of Exam:   10/15/2021 Medical Rec #: [id number]             Accession #:    [id number] Date of Birth: 01-Dec-1970             Patient Gender: M Patient Age:   21 years Exam Location:  [facility] Procedure:      VAS US DOPPLER PRE CABG Referring Phys: HARRELL LIGHTFOOT --------------------------------------------------------------------------------  Indications:      Pre-CABG. Comparison Study: no prior  Performing Technologist: Archie Patten RVS  Examination Guidelines: A complete evaluation includes B-mode imaging, spectral Doppler, color Doppler, and power Doppler as needed of all accessible portions of each vessel. Bilateral testing is considered an integral part of a complete examination. Limited examinations for reoccurring indications may be performed as noted.  Right Carotid Findings: +----------+--------+--------+--------+------------+--------+           PSV cm/sEDV cm/sStenosisDescribe    Comments +----------+--------+--------+--------+------------+--------+ CCA Prox  97      22              heterogenous         +----------+--------+--------+--------+------------+--------+ CCA Distal104     28              heterogenous         +----------+--------+--------+--------+------------+--------+ ICA Prox  63      25      Normal  heterogenous         +----------+--------+--------+--------+------------+--------+ ICA Distal97      36                                   +----------+--------+--------+--------+------------+--------+ ECA       121     23                                   +----------+--------+--------+--------+------------+--------+ +----------+--------+-------+--------+------------+           PSV cm/sEDV cmsDescribeArm Pressure +----------+--------+-------+--------+------------+ Subclavian93                                  +----------+--------+-------+--------+------------+ +---------+--------+--+--------+-+---------+ VertebralPSV cm/s26EDV cm/s8Antegrade +---------+--------+--+--------+-+---------+ Left Carotid Findings: +----------+--------+--------+--------+------------+--------+           PSV cm/sEDV cm/sStenosisDescribe    Comments +----------+--------+--------+--------+------------+--------+ CCA Prox  105     24              heterogenous         +----------+--------+--------+--------+------------+--------+ CCA Distal83       27              heterogenous         +----------+--------+--------+--------+------------+--------+ ICA Prox  71      28      Normal  heterogenous         +----------+--------+--------+--------+------------+--------+ ICA Distal94      38                                   +----------+--------+--------+--------+------------+--------+ ECA       99      22                                   +----------+--------+--------+--------+------------+--------+ +----------+--------+--------+--------+------------+  SubclavianPSV cm/sEDV cm/sDescribeArm Pressure +----------+--------+--------+--------+------------+           98                                   +----------+--------+--------+--------+------------+ +---------+--------+--+--------+--+---------+ VertebralPSV cm/s60EDV cm/s18Antegrade +---------+--------+--+--------+--+---------+  ABI Findings: +--------+------------------+-----+---------+--------+ Right   Rt Pressure (mmHg)IndexWaveform Comment  +--------+------------------+-----+---------+--------+ Brachial                       triphasic         +--------+------------------+-----+---------+--------+ ATA                            triphasic         +--------+------------------+-----+---------+--------+ PTA                            triphasic         +--------+------------------+-----+---------+--------+ +--------+------------------+-----+---------+-------+ Left    Lt Pressure (mmHg)IndexWaveform Comment +--------+------------------+-----+---------+-------+ Brachial                       triphasic        +--------+------------------+-----+---------+-------+ ATA                            triphasic        +--------+------------------+-----+---------+-------+ PTA                            triphasic        +--------+------------------+-----+---------+-------+  Right Doppler Findings: +--------+--------+-----+---------+--------+  Site    PressureIndexDoppler  Comments +--------+--------+-----+---------+--------+ Brachial             triphasic         +--------+--------+-----+---------+--------+ Radial               triphasic         +--------+--------+-----+---------+--------+ Ulnar                biphasic          +--------+--------+-----+---------+--------+  Left Doppler Findings: +--------+--------+-----+---------+--------+ Site    PressureIndexDoppler  Comments +--------+--------+-----+---------+--------+ Brachial             triphasic         +--------+--------+-----+---------+--------+ Radial               triphasic         +--------+--------+-----+---------+--------+ Ulnar                triphasic         +--------+--------+-----+---------+--------+  Summary: Right Carotid: There was no evidence of thrombus, dissection, atherosclerotic                plaque or stenosis in the cervical carotid system. Left Carotid: There was no evidence of thrombus, dissection, atherosclerotic               plaque or stenosis in the cervical carotid system. Vertebrals: Bilateral vertebral arteries demonstrate antegrade flow. Right Upper Extremity: Doppler waveform obliterate with right radial compression. Doppler waveforms remain within normal limits with right ulnar compression. Left Upper Extremity: Doppler waveforms remain within normal limits with left radial compression. Doppler waveforms remain within normal limits with left ulnar compression.     Preliminary    Disposition   Pt is  being discharged home today in good condition.  Follow-up Plans & Appointments   Continue IV heparin and NTG  Pt transferred by ambulance to [facility] for CABG   Follow-up Information     Revankar, Reita Cliche, MD Follow up.   Specialty: Cardiology Why: when [facility] MDs recommend Contact information: [address] [place] [address] [phone]                  Discharge Medications    Allergies as of 10/15/2021       Reactions   Armoracia Rusticana Ext (horseradish) Anaphylaxis, Hives, Shortness Of Breath   Other Hives   -Cocktail Sauce   Morphine And Related Itching   Trimethoprim Other (See Comments)   Kidney Toxicity   Rocephin [ceftriaxone Sodium In Dextrose]    When taking over a longer period developed severe body aches, shaking, and headaches    Sulfamethoxazole Rash        Medication List     STOP taking these medications    Advil Dual Action 125-250 MG Tabs Generic drug: Ibuprofen-Acetaminophen   BIOTIN PO   COLLAGEN PO   omeprazole 40 MG capsule Commonly known as: PRILOSEC Replaced by: pantoprazole 40 MG tablet   tadalafil 20 MG tablet Commonly known as: CIALIS   testosterone cypionate 200 MG/ML injection Commonly known as: DEPOTESTOSTERONE CYPIONATE       TAKE these medications    acetaminophen 325 MG tablet Commonly known as: TYLENOL Take 2 tablets (650 mg total) by mouth every 4 (four) hours as needed for headache or mild pain.   ALPRAZolam 0.5 MG tablet Commonly known as: XANAX Take 1 tablet (0.5 mg total) by mouth daily as needed for anxiety. What changed:  medication strength how much to take when to take this reasons to take this additional instructions   aspirin 81 MG EC tablet Take 1 tablet (81 mg total) by mouth daily. Swallow whole. Start taking on: October 16, 2021   heparin 25000 UT/250ML infusion Inject 1,300 Units/hr into the vein continuous.   nitroGLYCERIN 0.2 mg/mL infusion Inject 2-200 mcg/min into the vein to Surgery for 1 dose. Start taking on: October 17, 2021   nitroGLYCERIN 0.4 MG SL tablet Commonly known as: NITROSTAT Place 1 tablet (0.4 mg total) under the tongue every 5 (five) minutes as needed for chest pain. What changed: reasons to take this   ondansetron 4 MG/2ML Soln injection Commonly known as: ZOFRAN Inject 2 mLs (4 mg total) into the vein every 6 (six) hours as needed for  nausea.   pantoprazole 40 MG tablet Commonly known as: PROTONIX Take 1 tablet (40 mg total) by mouth daily. Start taking on: October 16, 2021 Replaces: omeprazole 40 MG capsule   rosuvastatin 40 MG tablet Commonly known as: CRESTOR Take 1 tablet (40 mg total) by mouth daily. Start taking on: October 16, 2021   sodium chloride 0.9 % infusion Inject 250 mLs into the vein as needed (for IV line care  (Saline / Heparin Lock)).           Outstanding Labs/Studies   Per [facility]  Duration of Discharge Encounter   Greater than 30 minutes including physician time.  Signed, Cecilie Kicks, NP 10/15/2021, 8:39 PM

## 2021-10-15 NOTE — Progress Notes (Signed)
[place] for heparin Indication: chest pain/ACS, s/p cath, likely CABG  Allergies  Allergen Reactions   Armoracia Rusticana Ext (Horseradish) Anaphylaxis, Hives and Shortness Of Breath   Other Hives    -Cocktail Sauce    Morphine And Related Itching   Trimethoprim Other (See Comments)    Kidney Toxicity     Rocephin [Ceftriaxone Sodium In Dextrose]     When taking over a longer period developed severe body aches, shaking, and headaches    Sulfamethoxazole Rash    Patient Measurements: Height: 5\' 7"  (170.2 cm) Weight: 81.6 kg (180 lb) IBW/kg (Calculated) : 66.1 Heparin Dosing Weight: 82kg  Vital Signs: Temp: 98.6 F (37 C) (11/23 0439) Temp Source: Oral (11/23 0439) BP: 110/74 (11/23 0439) Pulse Rate: 55 (11/23 0439)  Labs: Recent Labs    10/14/21 0615 10/14/21 2241 10/15/21 0241 10/15/21 0758  HGB  --   --  13.6  --   HCT  --   --  40.9  --   PLT  --   --  133*  --   HEPARINUNFRC  --  0.17*  --  0.47  CREATININE 1.25*  --  1.15  --      Estimated Creatinine Clearance: 77.7 mL/min (by C-G formula based on SCr of 1.15 mg/dL).   Medical History: Past Medical History:  Diagnosis Date   Anxiety    Asthma    early adulthood/ exercise induced   Atherosclerosis of aorta ([place]) 03/26/2021   Attention deficit hyperactivity disorder (ADHD) 04/03/2008   Qualifier: Diagnosis of  By: Sherlynn Stalls, CMA, Cindy     Avascular bone necrosis ([place]) 02/07/2020   Avascular necrosis ([place])    of knees from long term steroid use   Backache 07/22/2007   Qualifier: Diagnosis of  By: Sherlynn Stalls, CMA, Cindy     Bladder neck obstruction 03/23/2011   Bone pain    chronic   Chronic kidney disease    just 1 kidney congenital   Chronic steroid use 11/10/2013   Coffee ground emesis 11/10/2013   DEPRESSIVE DISORDER, RCR, MODERATE 06/30/2007   Qualifier: Diagnosis of  By: Sarajane Jews MD, Ishmael Holter    DERMATOMYOSITIS 04/27/2007   Qualifier: Diagnosis of  By:  Sherlynn Stalls, CMA, Cindy     Diarrhea 11/10/2013   Esophageal ulcer    ETHMOID SINUSITIS 04/03/2008   Qualifier: Diagnosis of  By: Sherlynn Stalls, CMA, Cindy     Extremity pain 04/04/2013   Generalized anxiety disorder 04/03/2008   Qualifier: Diagnosis of  By: Sherlynn Stalls, CMA, Cindy     GERD 04/03/2008   Qualifier: Diagnosis of  By: Sherlynn Stalls, CMA, Cindy     GI bleed 11/09/2013   H/O long-term treatment with high-risk medication 02/22/2012   HEADACHE 10/01/2010   Qualifier: Diagnosis of  By: Sarajane Jews MD, Stephen A    Hematemesis 11/10/2013   Hiatal hernia    History of pulmonary embolism 10/18/2018   HYPERHIDROSIS 04/27/2007   Qualifier: Diagnosis of  By: Sherlynn Stalls, CMA, Cindy     Hypogonadism, male 03/26/2009   Qualifier: Diagnosis of  By: Loanne Drilling MD, Sean A    Impotence of organic origin 03/01/2012   Insomnia    INTERSTITIAL LUNG DISEASE 04/27/2007   Qualifier: Diagnosis of  By: Sherlynn Stalls, CMA, [place], CHRONIC 04/27/2007   Qualifier: Diagnosis of  By: Sherlynn Stalls, CMA, Cindy     Low back pain    LUPUS 04/27/2007   Qualifier: Diagnosis of  By: Sherlynn Stalls, CMA, Jenny Reichmann  N&V (nausea and vomiting) 11/10/2013   Neck pain 12/19/2012   Obstructive sleep apnea 10/27/2016   Pain 02/25/2016   Pain syndrome, chronic 12/05/2012   PERICARDITIS 04/27/2007   Qualifier: Diagnosis of  By: Linna Darner, CMA, Cindy     PITUITARY INSUFFICIENCY 03/26/2009   Qualifier: Diagnosis of  By: Everardo All MD, Sean A    PLEURISY 04/27/2007   Qualifier: Diagnosis of  By: Linna Darner, CMA, Cindy     Presence of intrathecal pump 01/30/2020   Formatting of this note might be different from the original. Added automatically from request for surgery [id number]   Pulmonary embolism (HCC) 09/03/2011   Formatting of this note might be different from the original. somewhere between 2003-07 - took blood thinners for several months, then stopped   PULMONARY EMBOLISM, HX OF 04/27/2007   Qualifier: Diagnosis of  By: Linna Darner, CMA, Cindy     Pulmonary fibrosis (HCC)     RAYNAUD'S DISEASE 05/28/2009   Qualifier: Diagnosis of  By: Alphonzo Severance MD, Loni Dolly    Sjogren's syndrome [facility])    Solitary kidney, congenital 02/07/2020   Substance abuse (HCC) 12/06/2019   Seen 12/01/2019 at [facility] for hallucinations after ingestion of  meth taken 11/28/2019.    Assessment: Patient here for diagnostic left heart cath. Noted to have severe ostial LAD disease. Location not favorable for PCI, surgery is being consulted.   Heparin level at goal this morning on 1300 units/hr. CBC stable, plt count low at 133. No bleeding issues noted.   Goal of Therapy:  Heparin level 0.3-0.7 units/ml Monitor platelets by anticoagulation protocol: Yes   Plan:  Continue heparin at 1300 units/hr Likely CABG 11/25  Sheppard Coil PharmD., BCPS Clinical Pharmacist 10/15/2021 8:47 AM

## 2021-10-15 NOTE — Progress Notes (Signed)
     Discussed pt's medical conditions with H. Lightfoot.   Reviewed his recent visit to rheumatology clinic.   The patient has an appt in the next several weeks in pulmonary at [facility] for pulmonary fibrosis.  Given this comlex history including  the fact that he has had pericarditis in the past, concern that the CABG procedure may aggrevate these conditions.   Rheum/pulmonary problems may play a significant part of his post op course/recovery.    Will try to contact Dr Tami Ribas as well as CV surgery to see about transfer to [facility] for more coordinated/comprehensive surgical and postop care.    Signed, Dietrich Pates, MD  10/15/2021, 3:32 PM

## 2021-10-15 NOTE — Progress Notes (Signed)
Pre cabg has been completed.   Preliminary results in CV Proc.   Aundra Millet Marcel Gary 10/15/2021 11:43 AM

## 2021-10-15 NOTE — Progress Notes (Addendum)
Pt transported to [facility] by EMS. Pt stable at time of transport. All valuables taken by pt's wife Dinorah.

## 2021-10-15 NOTE — Progress Notes (Signed)
  Echocardiogram 2D Echocardiogram has been performed.  [name] 10/15/2021, 11:11 AM

## 2021-10-15 NOTE — Discharge Instructions (Signed)
Continue IV heparin and NTG  Pt transferred by ambulance to [facility] for CABG

## 2021-10-15 NOTE — Progress Notes (Signed)
Dr. Cliffton Asters will be to see pt, pt made aware. Emelda Brothers RN

## 2021-10-15 NOTE — Progress Notes (Addendum)
Progress Note  Patient Name: [name] Date of Encounter: 10/15/2021  [facility] Cardiologist: None   Subjective   Breathing Ok  No CP    Inpatient Medications    Scheduled Meds:  aspirin EC  81 mg Oral Daily   [START ON 10/17/2021] epinephrine  0-10 mcg/min Intravenous To OR   [START ON 10/17/2021] heparin-papaverine-plasmalyte irrigation   Irrigation To OR   [START ON 10/17/2021] insulin   Intravenous To OR   [START ON 10/17/2021] [place] Blood Cardioplegia vial (lidocaine/magnesium/mannitol 0.26g-4g-6.4g)   Intracoronary To OR   pantoprazole  40 mg Oral Daily   [START ON 10/17/2021] phenylephrine  30-200 mcg/min Intravenous To OR   [START ON 10/17/2021] potassium chloride  80 mEq Other To OR   rosuvastatin  40 mg Oral Daily   sodium chloride flush  3 mL Intravenous Q12H   [START ON 10/17/2021] tranexamic acid  15 mg/kg Intravenous To OR   [START ON 10/17/2021] tranexamic acid  2 mg/kg Intracatheter To OR   Continuous Infusions:  sodium chloride     [START ON 10/17/2021]  ceFAZolin (ANCEF) IV     [START ON 10/17/2021]  ceFAZolin (ANCEF) IV     [START ON 10/17/2021] dexmedetomidine     [START ON 10/17/2021] heparin 30,000 units/NS 1000 mL solution for CELLSAVER     heparin 1,300 Units/hr (10/15/21 0355)   [START ON 10/17/2021] milrinone     [START ON 10/17/2021] nitroGLYCERIN     [START ON 10/17/2021] norepinephrine     [START ON 10/17/2021] tranexamic acid (CYKLOKAPRON) infusion ([facility])     [START ON 10/17/2021] vancomycin     PRN Meds: sodium chloride, acetaminophen, ALPRAZolam, nitroGLYCERIN, ondansetron (ZOFRAN) IV, sodium chloride flush   Vital Signs    Vitals:   10/14/21 2238 10/14/21 2244 10/15/21 0117 10/15/21 0439  BP: 105/67 106/64 100/61 110/74  Pulse: 82 74 75 (!) 55  Resp:   16 16  Temp:   97.7 F (36.5 C) 98.6 F (37 C)  TempSrc:   Oral Oral  SpO2: 98% 98% 100% 97%  Weight:      Height:        Intake/Output Summary (Last 24  hours) at 10/15/2021 0820 Last data filed at 10/15/2021 0355 Gross per 24 hour  Intake 130.11 ml  Output 600 ml  Net -469.89 ml   Last 3 Weights 10/14/2021 10/06/2021 07/18/2021  Weight (lbs) 180 lb 182 lb 182 lb 2 oz  Weight (kg) 81.647 kg 82.555 kg 82.611 kg      Telemetry    SR  - Personally Reviewed  ECG    No new  - Personally Reviewed  Physical Exam   GEN: No acute distress.   Neck: No JVD Cardiac: RRR, no murmurs, rubs, or gallops.  Respiratory: Fine rales at bases GI: Soft, nontender, non-distended  MS: No edema; No deformity.  +varicosities (pt has had sclerotherapy it sounds in past)  Neuro:  Nonfocal  Psych: Normal affect   Labs    High Sensitivity Troponin:  No results for input(s): TROPONINIHS in the last 720 hours.   Chemistry Recent Labs  Lab 10/14/21 0615 10/15/21 0241  NA 138 138  K 4.0 4.2  CL 100 105  CO2 29 27  GLUCOSE 91 97  BUN 23* 16  CREATININE 1.25* 1.15  CALCIUM 8.8* 8.7*  GFRNONAA >60 >60  ANIONGAP 9 6    Lipids No results for input(s): CHOL, TRIG, HDL, LABVLDL, LDLCALC, CHOLHDL in the last 168 hours.  Hematology Recent Labs  Lab 10/15/21 0241  WBC 4.5  RBC 4.19*  HGB 13.6  HCT 40.9  MCV 97.6  MCH 32.5  MCHC 33.3  RDW 13.2  PLT 133*   Thyroid No results for input(s): TSH, FREET4 in the last 168 hours.  BNPNo results for input(s): BNP, PROBNP in the last 168 hours.  DDimer No results for input(s): DDIMER in the last 168 hours.   Radiology    CARDIAC CATHETERIZATION  Result Date: 10/14/2021   Mid RCA lesion is 25% stenosed.   Ost LAD to Prox LAD lesion is 80% stenosed.   Ost Cx to Prox Cx lesion is 25% stenosed.   Mid LAD-1 lesion is 25% stenosed.   Mid LAD-2 lesion is 25% stenosed.   The left ventricular systolic function is normal.   LV end diastolic pressure is normal.   The left ventricular ejection fraction is 55-65% by visual estimate.   There is no aortic valve stenosis. Severe ostial LAD disease.  Anatomy  unfavorable for PCI.  Given his chest discomfort at rest, will admit and get surgical consult.  May be a candidate for robotic LIMA to LAD. Will start IV heparin 8 hours post sheath pull. Long-term, he will need aggressive secondary prevention including lipid-lowering therapy.    Cardiac Studies   Cardiac cath   10/14/21    Mid RCA lesion is 25% stenosed.   Ost LAD to Prox LAD lesion is 80% stenosed.   Ost Cx to Prox Cx lesion is 25% stenosed.   Mid LAD-1 lesion is 25% stenosed.   Mid LAD-2 lesion is 25% stenosed.   The left ventricular systolic function is normal.   LV end diastolic pressure is normal.   The left ventricular ejection fraction is 55-65% by visual estimate.   There is no aortic valve stenosis.   Severe ostial LAD disease.  Anatomy unfavorable for PCI.  Given his chest discomfort at rest, will admit and get surgical consult.  May be a candidate for robotic LIMA to LAD.   Will start IV heparin 8 hours post sheath pull.   Long-term, he will need aggressive secondary prevention including lipid-lowering therapy.  Patient Profile     50 y.o. male with hx of rheum dz, OSA, pulmonary fibrosis   with CAD   Plan for CABG   Assessment & Plan    1  CAD  Pt with 80% ostial LAD lesion   Not optimal for PTCA/STent   Plan for CABG  25% lesions elsewhere     2  Rheum   Pt has an overlap rheum disorder   Followed at [facility]   has been on many immune modulators in the past   Has had pericarditis in the past     Given this hx I would consider use of colchicine to prevent this in the post op period  3  Pulmonary   Hx of PE in 2008 Not sure of context.   Also Pulmonary fibrosis  Fine rales on exam    4  Hx solitary kidney  Cr is OK     5OSA   Uses CPAP  6   Hx meth use  For questions or updates, please contact [facility] Please consult [email or web address] for contact info under        Signed, Dietrich Pates, MD  10/15/2021, 8:20 AM

## 2021-10-15 NOTE — Progress Notes (Signed)
Report Called to Vita Barley RN at [facility]. Emelda Brothers RN

## 2021-10-15 NOTE — Progress Notes (Signed)
Pt agreeable to transfer to [facility]. Wife at bedside. Emelda Brothers RN

## 2021-10-15 NOTE — Progress Notes (Addendum)
Received call from [facility] Admissions stating pt had a bed at [facility] [address] [place]. [place] [address]. [facility] transport team will transport pt- [phone]. Number for RN report [phone]. Accepting MD Dr. Charlton Haws. Emelda Brothers RN

## 2021-10-15 NOTE — Progress Notes (Addendum)
Pt was complaining of stabbing pain in his chest 2/10, 12-lead EKG was obtained; Mackie Pai, MD made aware, 2L [place] applied, and 2 SL Nitro were given. Pain alleviated by interventions and pt is now resting comfortably, will continue to monitor.  Bari Edward, RN

## 2021-10-16 LAB — LIPOPROTEIN A (LPA): Lipoprotein (a): 130.5 nmol/L — ABNORMAL HIGH (ref <75.0)

## 2021-10-17 SURGERY — CORONARY ARTERY BYPASS GRAFTING (CABG)
Anesthesia: General | Site: Chest

## 2021-10-19 DIAGNOSIS — Z951 Presence of aortocoronary bypass graft: Secondary | ICD-10-CM | POA: Insufficient documentation

## 2021-10-19 HISTORY — DX: Presence of aortocoronary bypass graft: Z95.1

## 2021-10-29 ENCOUNTER — Telehealth: Payer: Self-pay | Admitting: Family Medicine

## 2021-10-29 NOTE — Telephone Encounter (Signed)
Pt is calling and would like to know if dr fry would call in diflucan for yeast infection in mouth. Pt said he has lupus and after drinking some milk he noticed yeast in his mouth . Pt is aware he may need an appt last seen in may 2022 [facility] [id number] - [place], [place] - [address] Phone:  [phone]  Fax:  [phone]

## 2021-10-29 NOTE — Telephone Encounter (Signed)
Call in Diflucan 150 mg to take one as needed, #1 with 5 rf 

## 2021-10-30 ENCOUNTER — Other Ambulatory Visit: Payer: Self-pay

## 2021-10-30 MED ORDER — FLUCONAZOLE 150 MG PO TABS: ORAL_TABLET | ORAL | 5 refills | Status: DC

## 2021-10-30 NOTE — Telephone Encounter (Signed)
Spoke with patient informed Diflucan 150mg  has been sent to in [place].

## 2021-10-31 ENCOUNTER — Telehealth (HOSPITAL_COMMUNITY): Payer: Self-pay

## 2021-10-31 ENCOUNTER — Encounter (HOSPITAL_COMMUNITY): Payer: Self-pay

## 2021-10-31 NOTE — Telephone Encounter (Signed)
Outside/paper referral received by Dr. Shpak from [facility]. Will fax over Physician order and request further documents. Insurance benefits and eligibility to be determined.  

## 2021-10-31 NOTE — Telephone Encounter (Signed)
Attempted to call patient in regards to Cardiac Rehab - LM on VM Mailed letter 

## 2021-11-27 ENCOUNTER — Other Ambulatory Visit: Payer: Self-pay

## 2021-12-03 ENCOUNTER — Ambulatory Visit: Payer: BC Managed Care – PPO | Admitting: Cardiology

## 2021-12-16 ENCOUNTER — Encounter: Payer: Self-pay | Admitting: Family Medicine

## 2021-12-17 ENCOUNTER — Other Ambulatory Visit: Payer: Self-pay

## 2021-12-17 MED ORDER — AMOXICILLIN-POT CLAVULANATE 875-125 MG PO TABS: ORAL_TABLET | ORAL | 0 refills | Status: DC

## 2021-12-17 MED ORDER — CLARITHROMYCIN 500 MG PO TABS: ORAL_TABLET | ORAL | 0 refills | Status: DC

## 2021-12-17 NOTE — Telephone Encounter (Signed)
Yes we can do that. Call in Amoxicillin 875 mg BID for 10 days and Biaxin 500 mg BID for 10 days

## 2022-01-23 ENCOUNTER — Encounter: Payer: Self-pay | Admitting: Family Medicine

## 2022-01-23 MED ORDER — AMPHETAMINE-DEXTROAMPHETAMINE 30 MG PO TABS: 30.0000 mg | ORAL_TABLET | Freq: Two times a day (BID) | ORAL | 0 refills | Status: DC

## 2022-01-23 NOTE — Telephone Encounter (Signed)
I have decided to try this again. I sent in one 30 day supply of Adderall like before. He will need to check back with [place] before further refills  ?

## 2022-01-29 MED ORDER — AMPHETAMINE-DEXTROAMPHETAMINE 30 MG PO TABS: 30.0000 mg | ORAL_TABLET | Freq: Two times a day (BID) | ORAL | 0 refills | Status: DC

## 2022-01-29 NOTE — Telephone Encounter (Signed)
Done

## 2022-02-03 ENCOUNTER — Encounter: Payer: Self-pay | Admitting: Family Medicine

## 2022-02-03 ENCOUNTER — Ambulatory Visit: Payer: Managed Care, Other (non HMO) | Admitting: Family Medicine

## 2022-02-03 VITALS — BP 110/64 | HR 52 | Temp 98.9°F | Wt 189.0 lb

## 2022-02-03 DIAGNOSIS — J029 Acute pharyngitis, unspecified: Secondary | ICD-10-CM | POA: Diagnosis not present

## 2022-02-03 DIAGNOSIS — J019 Acute sinusitis, unspecified: Secondary | ICD-10-CM

## 2022-02-03 MED ORDER — AMOXICILLIN-POT CLAVULANATE 875-125 MG PO TABS: 1.0000 | ORAL_TABLET | Freq: Two times a day (BID) | ORAL | 0 refills | Status: DC

## 2022-02-03 NOTE — Progress Notes (Signed)
? ?  Subjective:  ? ? Patient ID: [name], male    DOB: 1971-07-17, 51 y.o.   MRN: [id number] ? ?HPI ?Here for 5 days of sinus congestion, PND, right ear pain, and coughing up yellow sputum. No SOB or fever. Taking Tylenol. He tests negative here today for strep and for Covid.  ? ? ?Review of Systems  ?Constitutional: Negative.   ?HENT:  Positive for congestion, ear pain and postnasal drip. Negative for sore throat.   ?Eyes: Negative.   ?Respiratory:  Positive for cough. Negative for shortness of breath and wheezing.   ? ?   ?Objective:  ? Physical Exam ?Constitutional:   ?   Appearance: Normal appearance. He is not ill-appearing.  ?HENT:  ?   Right Ear: Tympanic membrane, ear canal and external ear normal.  ?   Left Ear: Tympanic membrane, ear canal and external ear normal.  ?   Nose: Nose normal.  ?   Mouth/Throat:  ?   Pharynx: Oropharynx is clear.  ?Eyes:  ?   Conjunctiva/sclera: Conjunctivae normal.  ?Pulmonary:  ?   Breath sounds: No wheezing or rales.  ?   Comments: Has his usual bibasilar crackles  ?Lymphadenopathy:  ?   Cervical: No cervical adenopathy.  ?Neurological:  ?   Mental Status: He is alert.  ? ? ? ? ? ?   ?Assessment & Plan:  ?Sinusitis, treat with Augmentin. Add Mucinex as needed. ?Alysia Penna, MD ? ? ?

## 2022-02-04 LAB — POC COVID19 BINAXNOW: SARS Coronavirus 2 Ag: NEGATIVE

## 2022-02-04 LAB — POCT RAPID STREP A (OFFICE): Rapid Strep A Screen: NEGATIVE

## 2022-03-23 ENCOUNTER — Other Ambulatory Visit: Payer: Self-pay | Admitting: Family Medicine

## 2022-03-25 MED ORDER — AMPHETAMINE-DEXTROAMPHETAMINE 30 MG PO TABS: 30.0000 mg | ORAL_TABLET | Freq: Two times a day (BID) | ORAL | 0 refills | Status: DC

## 2022-03-25 NOTE — Telephone Encounter (Signed)
Last OV- 02/03/22 ?Last refill- 01/29/22--60 tabs, 0 refills ? ?No future OV scheduled. ?

## 2022-03-25 NOTE — Telephone Encounter (Signed)
Done

## 2022-03-27 ENCOUNTER — Telehealth: Payer: Self-pay | Admitting: Family Medicine

## 2022-03-30 NOTE — Telephone Encounter (Signed)
Left pt a detailed message advising to call the office regarding this request ?

## 2022-04-01 NOTE — Telephone Encounter (Signed)
Pt LOV was on 02/03/2022 ?Last refill done on the same day ?Please advise if ok to send refill ?

## 2022-04-01 NOTE — Telephone Encounter (Signed)
Pt is returning [name] call he said he has lupus and when he get thrush in his mouth that medication take care of issue. He can be reached at work the number is [phone] ?

## 2022-04-01 NOTE — Telephone Encounter (Signed)
Call in Diflucan 150 mg to take one as needed for fungal infection, #30 with 5 rf ?

## 2022-04-02 ENCOUNTER — Other Ambulatory Visit: Payer: Self-pay

## 2022-04-02 MED ORDER — FLUCONAZOLE 150 MG PO TABS: 150.0000 mg | ORAL_TABLET | ORAL | 5 refills | Status: DC | PRN

## 2022-04-02 NOTE — Telephone Encounter (Signed)
New Rx for Diflucan sent to pt pharmacy per Dr Clent Ridges ?

## 2022-04-16 ENCOUNTER — Telehealth: Payer: Managed Care, Other (non HMO) | Admitting: Physician Assistant

## 2022-04-16 DIAGNOSIS — H1032 Unspecified acute conjunctivitis, left eye: Secondary | ICD-10-CM | POA: Diagnosis not present

## 2022-04-16 MED ORDER — TOBRAMYCIN 0.3 % OP SOLN: 2.0000 [drp] | OPHTHALMIC | 0 refills | Status: AC

## 2022-04-16 NOTE — Progress Notes (Signed)
I have spent 5 minutes in review of e-visit questionnaire, review and updating patient chart, medical decision making and response to patient.   Addison Freimuth Cody Trejon Duford, PA-C    

## 2022-04-16 NOTE — Progress Notes (Signed)
E-Visit for [employer]   We are sorry that you are not feeling well.  Here is how we plan to help!  Based on what you have shared with me it looks like you have conjunctivitis.  Conjunctivitis is a common inflammatory or infectious condition of the eye that is often referred to as "pink eye".  In most cases it is contagious (viral or bacterial). However, not all conjunctivitis requires antibiotics (ex. Allergic).  We have made appropriate suggestions for you based upon your presentation.  I have sent in Tobramycin drops for you to take as directed.   Pink eye can be highly contagious.  It is typically spread through direct contact with secretions, or contaminated objects or surfaces that one may have touched.  Strict handwashing is suggested with soap and water is urged.  If not available, use alcohol based had sanitizer.  Avoid unnecessary touching of the eye.  If you wear contact lenses, you will need to refrain from wearing them until you see no white discharge from the eye for at least 24 hours after being on medication.  You should see symptom improvement in 1-2 days after starting the medication regimen.  Call us if symptoms are not improved in 1-2 days.  Home Care: Wash your hands often! Do not wear your contacts until you complete your treatment plan. Avoid sharing towels, bed linen, personal items with a person who has pink eye. See attention for anyone in your home with similar symptoms.  Get Help Right Away If: Your symptoms do not improve. You develop blurred or loss of vision. Your symptoms worsen (increased discharge, pain or redness)   Thank you for choosing an e-visit.  Your e-visit answers were reviewed by a board certified advanced clinical practitioner to complete your personal care plan. Depending upon the condition, your plan could have included both over the counter or prescription medications.  Please review your pharmacy choice. Make sure the pharmacy is open so you  can pick up prescription now. If there is a problem, you may contact your provider through [employer] and have the prescription routed to another pharmacy.  Your safety is important to [place]. If you have drug allergies check your prescription carefully.   For the next 24 hours you can use MyChart to ask questions about today's visit, request a non-urgent call back, or ask for a work or school excuse. You will get an email in the next two days asking about your experience. I hope that your e-visit has been valuable and will speed your recovery.

## 2022-05-04 ENCOUNTER — Encounter: Payer: Self-pay | Admitting: Family Medicine

## 2022-05-05 NOTE — Telephone Encounter (Signed)
He already  has refills until 06-24-22

## 2022-05-12 ENCOUNTER — Encounter: Payer: Self-pay | Admitting: Family Medicine

## 2022-05-12 ENCOUNTER — Telehealth (INDEPENDENT_AMBULATORY_CARE_PROVIDER_SITE_OTHER): Payer: Managed Care, Other (non HMO) | Admitting: Family Medicine

## 2022-05-12 DIAGNOSIS — J019 Acute sinusitis, unspecified: Secondary | ICD-10-CM | POA: Diagnosis not present

## 2022-05-12 MED ORDER — AMOXICILLIN-POT CLAVULANATE 875-125 MG PO TABS: 1.0000 | ORAL_TABLET | Freq: Two times a day (BID) | ORAL | 0 refills | Status: DC
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
## 2022-05-13 NOTE — Telephone Encounter (Signed)
Please provide him a note for work that allows him to work from home this week

## 2022-05-13 NOTE — Telephone Encounter (Signed)
Pt call back and stated he didn't get the work note from dr.Fry that state he is allows to work from home rest of this wk.

## 2022-05-13 NOTE — Telephone Encounter (Signed)
Spoke with pt state that he  received his work excuse note on [employer]

## 2022-06-13 ENCOUNTER — Other Ambulatory Visit: Payer: Self-pay | Admitting: Family Medicine

## 2022-06-15 NOTE — Telephone Encounter (Signed)
Last Rx given on 10/15/2021 by NP with Dr Hazle Coca office

## 2022-07-02 ENCOUNTER — Other Ambulatory Visit: Payer: Self-pay | Admitting: Family Medicine

## 2022-07-02 NOTE — Telephone Encounter (Signed)
Pt LOV was on 05/12/2022 Last refill done on  05/25/22 Please advise

## 2022-07-03 ENCOUNTER — Telehealth: Payer: Self-pay | Admitting: Family Medicine

## 2022-07-03 MED ORDER — AMPHETAMINE-DEXTROAMPHETAMINE 30 MG PO TABS: 30.0000 mg | ORAL_TABLET | Freq: Two times a day (BID) | ORAL | 0 refills | Status: DC

## 2022-07-03 NOTE — Telephone Encounter (Signed)
Pt called to say he sent a message via MyChart requesting a refill by Friday, as he is going out of town.  Pt asked if MD would refill his prescription for:  amphetamine-dextroamphetamine (ADDERALL) 30 MG tablet (Expired)  Pt's last OV: 02/03/22   Does MD want him to be scheduled for an OV?  Please advise.  [id number] [place], [place] - [address] AT [address] OF [address] [facility] Phone:  [phone]  Fax:  [phone]

## 2022-07-03 NOTE — Telephone Encounter (Signed)
Done

## 2022-07-20 ENCOUNTER — Telehealth: Payer: Self-pay | Admitting: Pulmonary Disease

## 2022-07-23 DIAGNOSIS — H5213 Myopia, bilateral: Secondary | ICD-10-CM | POA: Diagnosis not present

## 2022-07-23 NOTE — Telephone Encounter (Signed)
Attempted to call pt but unable to reach. Left message for him to return call. °

## 2022-07-24 NOTE — Telephone Encounter (Signed)
Called and spoke with pt who states his current cpap machine is over 52 years old and he is wanting to get a new one. Pt said he believes settings might need to be changed some.  Stated to pt that we would need to get him in for an appt as it has been over a year since we have seen him and he verbalized understanding. Appt made for pt. Nothing further needed.

## 2022-07-31 ENCOUNTER — Ambulatory Visit: Payer: Managed Care, Other (non HMO) | Admitting: Primary Care

## 2022-08-03 ENCOUNTER — Telehealth: Payer: Self-pay | Admitting: Gastroenterology

## 2022-08-03 MED ORDER — OMEPRAZOLE 40 MG PO CPDR: 40.0000 mg | DELAYED_RELEASE_CAPSULE | Freq: Two times a day (BID) | ORAL | 0 refills | Status: AC

## 2022-08-03 MED ORDER — PANTOPRAZOLE SODIUM 40 MG PO TBEC: 40.0000 mg | DELAYED_RELEASE_TABLET | Freq: Every day | ORAL | 0 refills | Status: DC

## 2022-08-03 NOTE — Telephone Encounter (Signed)
Inbound call from patient stating he needs a refill for omeprazole but can not remember if he needs to take it once daily, or twice daily. Patient is requesting a call back to discuss. Please advise.

## 2022-08-03 NOTE — Telephone Encounter (Signed)
Sent Prilosec 40 mg for 30 days to the [facility] at [place]. Patient is informed. Patient has upcoming appointment on 09/16/22.

## 2022-08-05 ENCOUNTER — Ambulatory Visit: Payer: Managed Care, Other (non HMO) | Admitting: Primary Care

## 2022-08-12 ENCOUNTER — Telehealth: Payer: Self-pay | Admitting: Gastroenterology

## 2022-08-12 NOTE — Telephone Encounter (Signed)
PT has an appt 10/25 and is experiencingalot of bloating, gas as well as low appetite. He is looking for relief options until he can get to his appointment. He is only currently taking Miralax. Please advise

## 2022-08-13 NOTE — Telephone Encounter (Signed)
Pt reports that he has had increased bloating, gas, nausea, abdominal discomfort, and decreased appetite. Pt states that symptoms have gotten worse within the last month and he is scheduled to see Dr. Bryan Lemma on 10/27. Pt states he takes omeprazole 40 mg BID everyday and wanted to know what else he can do until appointment. Pt also mentioned he wanted to have TIF procedure this year if possible.

## 2022-08-13 NOTE — Telephone Encounter (Signed)
Left message for pt to call back  °

## 2022-08-13 NOTE — Telephone Encounter (Signed)
Good morning ,patient is returning your call.

## 2022-08-14 ENCOUNTER — Other Ambulatory Visit: Payer: Self-pay

## 2022-08-14 MED ORDER — DICYCLOMINE HCL 10 MG PO CAPS: 10.0000 mg | ORAL_CAPSULE | Freq: Four times a day (QID) | ORAL | 3 refills | Status: DC | PRN

## 2022-08-14 NOTE — Telephone Encounter (Signed)
Trial course of Bentyl 10 mg p.o. as needed every 6 hours as needed for abdominal discomfort, #60, RF 3.  Can also use simethicone OTC for gas, bloating.  Recommend trialing low FODMAP diet X4-6 weeks.  Can discuss whether or not to proceed with TIF at follow-up appointment.

## 2022-08-14 NOTE — Telephone Encounter (Signed)
Gave pt recommendations. Bentyl sent to pt's pharmacy and low fodmap eating plan sent to pt's mychart. Pt verbalized understanding and had no other concerns at end of call.

## 2022-08-20 DIAGNOSIS — M5030 Other cervical disc degeneration, unspecified cervical region: Secondary | ICD-10-CM | POA: Diagnosis not present

## 2022-08-20 DIAGNOSIS — G894 Chronic pain syndrome: Secondary | ICD-10-CM | POA: Diagnosis not present

## 2022-08-20 DIAGNOSIS — M542 Cervicalgia: Secondary | ICD-10-CM | POA: Diagnosis not present

## 2022-08-20 DIAGNOSIS — Z79899 Other long term (current) drug therapy: Secondary | ICD-10-CM | POA: Diagnosis not present

## 2022-08-20 DIAGNOSIS — Z5181 Encounter for therapeutic drug level monitoring: Secondary | ICD-10-CM | POA: Diagnosis not present

## 2022-08-20 DIAGNOSIS — Z978 Presence of other specified devices: Secondary | ICD-10-CM | POA: Diagnosis not present

## 2022-08-20 DIAGNOSIS — M329 Systemic lupus erythematosus, unspecified: Secondary | ICD-10-CM | POA: Diagnosis not present

## 2022-08-24 DIAGNOSIS — E291 Testicular hypofunction: Secondary | ICD-10-CM | POA: Diagnosis not present

## 2022-08-27 DIAGNOSIS — G894 Chronic pain syndrome: Secondary | ICD-10-CM | POA: Diagnosis not present

## 2022-08-27 DIAGNOSIS — M5030 Other cervical disc degeneration, unspecified cervical region: Secondary | ICD-10-CM | POA: Diagnosis not present

## 2022-08-27 DIAGNOSIS — M5440 Lumbago with sciatica, unspecified side: Secondary | ICD-10-CM | POA: Diagnosis not present

## 2022-08-27 DIAGNOSIS — Z978 Presence of other specified devices: Secondary | ICD-10-CM | POA: Diagnosis not present

## 2022-09-07 DIAGNOSIS — Z462 Encounter for fitting and adjustment of other devices related to nervous system and special senses: Secondary | ICD-10-CM

## 2022-09-07 HISTORY — DX: Encounter for fitting and adjustment of other devices related to nervous system and special senses: Z46.2

## 2022-09-16 ENCOUNTER — Ambulatory Visit: Payer: Self-pay | Admitting: Gastroenterology

## 2022-09-23 ENCOUNTER — Ambulatory Visit (INDEPENDENT_AMBULATORY_CARE_PROVIDER_SITE_OTHER): Payer: Self-pay | Admitting: Primary Care

## 2022-09-23 ENCOUNTER — Ambulatory Visit (INDEPENDENT_AMBULATORY_CARE_PROVIDER_SITE_OTHER): Payer: Self-pay

## 2022-09-23 ENCOUNTER — Encounter: Payer: Self-pay | Admitting: Primary Care

## 2022-09-23 VITALS — BP 120/70 | HR 87 | Temp 98.8°F | Ht 67.0 in | Wt 185.0 lb

## 2022-09-23 DIAGNOSIS — J8410 Pulmonary fibrosis, unspecified: Secondary | ICD-10-CM

## 2022-09-23 DIAGNOSIS — J849 Interstitial pulmonary disease, unspecified: Secondary | ICD-10-CM

## 2022-09-23 DIAGNOSIS — J4520 Mild intermittent asthma, uncomplicated: Secondary | ICD-10-CM

## 2022-09-23 DIAGNOSIS — G4733 Obstructive sleep apnea (adult) (pediatric): Secondary | ICD-10-CM

## 2022-09-23 MED ORDER — FLUTICASONE FUROATE-VILANTEROL 100-25 MCG/ACT IN AEPB: 1.0000 | INHALATION_SPRAY | Freq: Every day | RESPIRATORY_TRACT | 5 refills | Status: DC

## 2022-09-23 NOTE — Addendum Note (Signed)
Addended by: Loma Sousa on: 09/23/2022 11:51 AM   Modules accepted: Orders

## 2022-09-23 NOTE — Assessment & Plan Note (Addendum)
-   ILD secondary to SLE. HRCT with UIP pattern and shows progression. He has dyspnea symptoms with moderate exertion. Follows with [facility] rheumatology, last seen in Nov 2022. Recommended to return in 4 months. There was some mention of restarting Cellcept d/t lung diease progression. Needs CXR and updated spriometry today

## 2022-09-23 NOTE — Assessment & Plan Note (Addendum)
-   Patient is 100% compliant with CPAP use over the last 30 days  - Current pressure setting auto 5-20cm h20; Residual AHI 1.7/hr - No pressure changes needed today. Continue to encourage patient wear CPAP nightly.  - DME order placed for new CPAP machine

## 2022-09-23 NOTE — Patient Instructions (Addendum)
Recommendations - Continue to wear CPAP every night for minimum 4 to 6 hours or longer - You need to return to see [place] rheumatology, they last saw you in November 2022 and wanted to see you back in 4 months (there was mention of restarting CellCept)  Lucy Antigua, MD (Attending) [phone] (Work) [place], [place] [address] Rheumatology  Orders New CPAP machine 5 to 20 cm H2O Chest x-ray re: ILD (ordered) Spirometry re: ILD (done)  Follow-up: 3 months with Dr. Azzie Roup - needs to go to [place] location (if he can not get in with Dr. Halford Chessman, needs 30 min visit with Dr. Vaughan Browner or [place] in [place])

## 2022-09-23 NOTE — Progress Notes (Signed)
_0  ID: [name], male    DOB: 07-09-1971, 51 y.o.   MRN: [id number]  Chief Complaint  Patient presents with   Follow-up    Referring provider: Laurey Morale, MD  HPI: 51 year old male, never smoked. PMH significant for ILD, OSA on CPAP, Sjogren's syndrome, GERD, raynaud's disease, lupus, hx pulmonary embolism, ADHD, depression, anxiety. Patient of Dr. Halford Chessman, last seen by pulmonary NP 01/17/20.   Previous [facility] pulmonary encounter: 03/26/2021 Patient presents today for overdue follow-up OSA/ILD. During last visit he was given sample of Breo which did help his breathing but was too expensive. Advair was also not affordable. He is requesting similar inhaler.   He is looking a new mask that does not put pressure on face. He is also needs a new machine, its > 5 years and reservoir is not working properly or warming. Currently using Resmed. Waking up with dry mouth, he had 6 cavities with his dentist recently.  ILD hx SLE and Sjogrens. Serology positive for SM AB, SSA and ANA HRCT with UIP pattern and shows progression. Referred back to [facility] rheumatology, they saw him for consult in March 2021 and recommended 3 month follow-up. Plan was to restart therapy. PFTs were relatively stable in January 2021. DLCO improved to prior.   Airview download 02/23/21-03/24/21 Usage 29/30 days (97%); 27 days (90%) > 4 hours Average usage 6 hours 21 mins Pressure 5-20cm h20 (14.6cm h20-95%) Airleaks 0.8L/min AHI 1.1  09/23/2022- Interim hx  Patient presents today for follow-up OSA/ILD.   He is doing well. Sleeping alright at night. He remains complaint with CPAP. No issues with mask fit or pressure setting. Needs new machine, current one is >61 years old. His CPAP machine is working but will cut off at times. DME is [employer].   He follows with [facility], last seen in November 2022 and they recommended to return in 4 months. There was some mention of restarting Cellcept d/t lung diease  progression.  He gets our of breath when rushing around. Denies fevers, joint pain or rash.  He has an apt with ENT for possible nasal polyps    Pulmonary testing:  PFT 07/08/07 >> FEV1 2.02 (52%), FEV1% 81, TLC 3.25 (53%), DLCO 73% PFT 06/13/10 >> FEV1 2.12 (54%), FEV1% 83, TLC 3.67 (58%), DLCO 70% Spirometry 11/2010  is suggestive of restrictive defect. Moderate restriction confirmed on lung volume testing. Mild diffusion defect. No significant bronchodilator response. Spirometry 11/29/19  >> FEV1 2.06 (59%), TLC 3.55 (57%), DLCO 20.77 (79%)  Spirometry 09/23/2022 >> FVC 2.22 (49%), FEV1 1.74 (50%), ratio 78   Serology: 05/2204 >> RF 31, ANCA negative, ANA 1:2560 speckled, anti DS DNA 68, anti Jo negative, anti SM positive, anti Ro/La positive, aldolase 3.1 09/15/19 >> ANCA negative, RF < 14, ANA positive, SM Ab > 8, SSA > 8, SCL 70 negative, SSB negative, dsDNA negative, aldolase 4.5, ESR 28  Imaging: CT chest 01/05/03 >> RLL PE CT chest 04/29/06 >> bibasilar linear scarring and slight subpleural honeycombing CT chest 07/08/07 >> mild reticulation and architectural distortion within subpleural and basilar distribution with minimal fibrosis, small HH CT chest 09/22/19 >> extensive septal thickening, subpleural reticulation, cylindrical BTX with honeycombing  Sleeping studies:  HST 12/30/16 >> AHI 28.4, SaO2 low 49% Auto CPAP 09/19/19 to 10/18/19 >> used on 29 of 30 nights with average 5 hrs 26 min. Average AHI 1.5 with median CPAP 10 and 95 th percentile CPAP 15 cm H2O.   Allergies  Allergen  Reactions   Armoracia Rusticana Ext (Horseradish) Anaphylaxis, Hives and Shortness Of Breath   Other Hives    -Cocktail Sauce    Morphine And Related Itching   Trimethoprim Other (See Comments)    Kidney Toxicity     Rocephin [Ceftriaxone Sodium In Dextrose]     When taking over a longer period developed severe body aches, shaking, and headaches    Sulfamethoxazole Rash    Immunization History   Administered Date(s) Administered   Influenza Split 01/13/2012, 10/04/2012, 11/23/2016   Influenza Whole 08/23/2005, 08/24/2008   Influenza,inj,Quad PF,6+ Mos 11/10/2013, 02/07/2015, 08/16/2015, 10/27/2016, 09/15/2019, 10/01/2021   Influenza,trivalent, recombinat, inj, PF 11/23/2016   Influenza-Unspecified 01/22/2012    Past Medical History:  Diagnosis Date   Angina pectoris ([place]) 10/06/2021   Anxiety    Asthma    early adulthood/ exercise induced   Atherosclerosis of aorta ([place]) 03/26/2021   Attention deficit hyperactivity disorder (ADHD) 04/03/2008   Qualifier: Diagnosis of  By: Sherlynn Stalls, CMA, Cindy     Avascular bone necrosis ([place]) 02/07/2020   Avascular necrosis ([place])    of knees from long term steroid use   Backache 07/22/2007   Qualifier: Diagnosis of  By: Sherlynn Stalls, CMA, Cindy     Bladder neck obstruction 03/23/2011   Bone pain    chronic   CAD (coronary artery disease) severe LAD disease need for bypass 10/15/2021   Chronic kidney disease    just 1 kidney congenital   Chronic steroid use 11/10/2013   Coffee ground emesis 11/10/2013   Coronary atherosclerosis 10/06/2021   DEPRESSIVE DISORDER, RCR, MODERATE 06/30/2007   Qualifier: Diagnosis of  By: Sarajane Jews MD, Ishmael Holter    DERMATOMYOSITIS 04/27/2007   Qualifier: Diagnosis of  By: Sherlynn Stalls, CMA, Cindy     Diarrhea 11/10/2013   Esophageal ulcer    ETHMOID SINUSITIS 04/03/2008   Qualifier: Diagnosis of  By: Sherlynn Stalls, CMA, Cindy     Extremity pain 04/04/2013   Generalized anxiety disorder 04/03/2008   Qualifier: Diagnosis of  By: Sherlynn Stalls, CMA, Cindy     GERD 04/03/2008   Qualifier: Diagnosis of  By: Sherlynn Stalls, CMA, Cindy     GI bleed 11/09/2013   H/O long-term treatment with high-risk medication 02/22/2012   HEADACHE 10/01/2010   Qualifier: Diagnosis of  By: Sarajane Jews MD, Stephen A    Hematemesis 11/10/2013   Hiatal hernia    History of pulmonary embolism 10/18/2018   HLD (hyperlipidemia) 10/15/2021   HYPERHIDROSIS 04/27/2007    Qualifier: Diagnosis of  By: Sherlynn Stalls, CMA, Cindy     Hypogonadism, male 03/26/2009   Qualifier: Diagnosis of  By: Loanne Drilling MD, Sean A    Impotence of organic origin 03/01/2012   Insomnia    INTERSTITIAL LUNG DISEASE 04/27/2007   Qualifier: Diagnosis of  By: Sherlynn Stalls, CMA, Cindy     LEUKOPENIA, CHRONIC 04/27/2007   Qualifier: Diagnosis of  By: Sherlynn Stalls, CMA, Cindy     Low back pain    LUPUS 04/27/2007   Qualifier: Diagnosis of  By: Sherlynn Stalls, CMA, Cindy     N&V (nausea and vomiting) 11/10/2013   Neck pain 12/19/2012   OSA on CPAP 10/27/2016   Pain 02/25/2016   Pain syndrome, chronic 12/05/2012   PERICARDITIS 04/27/2007   Qualifier: Diagnosis of  By: Sherlynn Stalls, CMA, Cindy     PITUITARY INSUFFICIENCY 03/26/2009   Qualifier: Diagnosis of  By: Loanne Drilling MD, Sean A    PLEURISY 04/27/2007   Qualifier: Diagnosis of  By: Sherlynn Stalls, CMA, Cindy     Presence of  intrathecal pump 01/30/2020   Formatting of this note might be different from the original. Added automatically from request for surgery [id number]   Pulmonary embolism ([place]) 09/03/2011   Formatting of this note might be different from the original. somewhere between 2003-07 - took blood thinners for several months, then stopped   PULMONARY EMBOLISM, HX OF 04/27/2007   Qualifier: Diagnosis of  By: Sherlynn Stalls, CMA, Cindy     Pulmonary fibrosis ([place])    RAYNAUD'S DISEASE 05/28/2009   Qualifier: Diagnosis of  By: Niel Hummer MD, Lorinda Creed    S/P CABG x 1 10/19/2021   Formatting of this note might be different from the original. LIMA-LAD 10/18/21   Sjogren's syndrome ([place])    Solitary kidney, congenital 02/07/2020   Substance abuse ([place]) 12/06/2019   Seen 12/01/2019 at [facility] for hallucinations after ingestion of  meth taken 11/28/2019.   Unstable angina ([place]) 10/14/2021    Tobacco History: Social History   Tobacco Use  Smoking Status Never  Smokeless Tobacco Never   Counseling given: Not Answered   Outpatient Medications Prior to Visit  Medication Sig  Dispense Refill   acetaminophen (TYLENOL) 325 MG tablet Take 2 tablets (650 mg total) by mouth every 4 (four) hours as needed for headache or mild pain.     ALPRAZolam (XANAX) 0.5 MG tablet Take 1 tablet (0.5 mg total) by mouth daily as needed for anxiety.  0   ALPRAZolam (XANAX) 1 MG tablet TAKE ONE TABLET BY MOUTH THREE TIMES A DAY AS NEEDED FOR ANXIETY 90 tablet 5   amoxicillin-clavulanate (AUGMENTIN) 875-125 MG tablet Take 1 tablet by mouth 2 (two) times daily. 20 tablet 0   amphetamine-dextroamphetamine (ADDERALL) 30 MG tablet Take 1 tablet by mouth 2 (two) times daily. 60 tablet 0   aspirin EC 81 MG EC tablet Take 1 tablet (81 mg total) by mouth daily. Swallow whole. 30 tablet 11   dicyclomine (BENTYL) 10 MG capsule Take 1 capsule (10 mg total) by mouth every 6 (six) hours as needed (abdominal discomfort). 60 capsule 3   fluconazole (DIFLUCAN) 150 MG tablet Take 1 tablet (150 mg total) by mouth as needed. 30 tablet 5   omeprazole (PRILOSEC) 40 MG capsule Take 1 capsule (40 mg total) by mouth 2 (two) times daily. 60 capsule 0   ondansetron (ZOFRAN) 4 MG/2ML SOLN injection Inject 2 mLs (4 mg total) into the vein every 6 (six) hours as needed for nausea. 2 mL 0   rosuvastatin (CRESTOR) 40 MG tablet Take 1 tablet (40 mg total) by mouth daily.     sodium chloride 0.9 % infusion Inject 250 mLs into the vein as needed (for IV line care  (Saline / Heparin Lock)). 500 mL 0   testosterone cypionate (DEPOTESTOSTERONE CYPIONATE) 200 MG/ML injection Inject 0.5 mLs into the muscle once a week.     No facility-administered medications prior to visit.   Review of Systems  Review of Systems  Constitutional: Negative.   HENT: Negative.    Respiratory:  Positive for shortness of breath. Negative for cough, chest tightness and wheezing.   Cardiovascular: Negative.   Musculoskeletal:  Negative for arthralgias.  Skin:  Negative for rash.   Physical Exam  BP 120/70   Pulse 87   Temp 98.8 F (37.1 C)  (Oral)   Ht _0  (1.702 m)   Wt 185 lb (83.9 kg)   SpO2 97%   BMI 28.98 kg/m  Physical Exam Constitutional:      General: He is  not in acute distress.    Appearance: Normal appearance. He is not ill-appearing.  Cardiovascular:     Rate and Rhythm: Normal rate and regular rhythm.  Pulmonary:     Effort: Pulmonary effort is normal.     Breath sounds: Normal breath sounds.  Musculoskeletal:        General: Normal range of motion.  Skin:    General: Skin is warm and dry.  Neurological:     General: No focal deficit present.     Mental Status: He is alert and oriented to person, place, and time. Mental status is at baseline.  Psychiatric:        Mood and Affect: Mood normal.        Behavior: Behavior normal.        Thought Content: Thought content normal.        Judgment: Judgment normal.      Lab Results:  CBC    Component Value Date/Time   WBC 4.5 10/15/2021 0241   RBC 4.19 (L) 10/15/2021 0241   HGB 13.6 10/15/2021 0241   HGB 14.8 10/06/2021 1625   HCT 40.9 10/15/2021 0241   HCT 42.5 10/06/2021 1625   PLT 133 (L) 10/15/2021 0241   PLT 146 (L) 10/06/2021 1625   MCV 97.6 10/15/2021 0241   MCV 95 10/06/2021 1625   MCH 32.5 10/15/2021 0241   MCHC 33.3 10/15/2021 0241   RDW 13.2 10/15/2021 0241   RDW 12.7 10/06/2021 1625   LYMPHSABS 1.6 10/06/2021 1625   MONOABS 0.8 09/15/2019 1035   EOSABS 0.1 10/06/2021 1625   BASOSABS 0.0 10/06/2021 1625    BMET    Component Value Date/Time   NA 138 10/15/2021 0241   NA 141 10/06/2021 1625   K 4.2 10/15/2021 0241   CL 105 10/15/2021 0241   CO2 27 10/15/2021 0241   GLUCOSE 97 10/15/2021 0241   BUN 16 10/15/2021 0241   BUN 21 10/06/2021 1625   CREATININE 1.15 10/15/2021 0241   CALCIUM 8.7 (L) 10/15/2021 0241   GFRNONAA >60 10/15/2021 0241   GFRAA 84 (L) 11/09/2013 2100    BNP No results found for: "BNP"  ProBNP No results found for: "PROBNP"  Imaging: No results found.   Assessment & Plan:   INTERSTITIAL  LUNG DISEASE - ILD secondary to SLE. HRCT with UIP pattern and shows progression. He has dyspnea symptoms with moderate exertion. Follows with [facility], last seen in Nov 2022. Recommended to return in 4 months. There was some mention of restarting Cellcept d/t lung diease progression. Needs CXR and updated spriometry today   OSA on CPAP - Patient is 100% compliant with CPAP use over the last 30 days  - Current pressure setting auto 5-20cm h20; Residual AHI 1.7/hr - No pressure changes needed today. Continue to encourage patient wear CPAP nightly.  - DME order placed for new CPAP machine   Asthma - Symptoms started in early adulthood. Primarily exercise induced. He saw clinical benefit when on BREO but could not afford. He would like to restart.      Martyn Ehrich, NP 09/23/2022

## 2022-09-23 NOTE — Assessment & Plan Note (Addendum)
-   Symptoms started in early adulthood. Primarily exercise induced. He saw clinical benefit when on BREO but could not afford. He would like to restart.

## 2022-09-24 ENCOUNTER — Ambulatory Visit (INDEPENDENT_AMBULATORY_CARE_PROVIDER_SITE_OTHER): Payer: Medicaid Other | Admitting: Gastroenterology

## 2022-09-24 ENCOUNTER — Encounter: Payer: Self-pay | Admitting: Gastroenterology

## 2022-09-24 VITALS — BP 122/80 | HR 73 | Ht 67.0 in | Wt 187.0 lb

## 2022-09-24 DIAGNOSIS — M350C Sjogren syndrome with dental involvement: Secondary | ICD-10-CM

## 2022-09-24 DIAGNOSIS — K2100 Gastro-esophageal reflux disease with esophagitis, without bleeding: Secondary | ICD-10-CM | POA: Diagnosis not present

## 2022-09-24 DIAGNOSIS — M8700 Idiopathic aseptic necrosis of unspecified bone: Secondary | ICD-10-CM

## 2022-09-24 DIAGNOSIS — K449 Diaphragmatic hernia without obstruction or gangrene: Secondary | ICD-10-CM | POA: Diagnosis not present

## 2022-09-24 DIAGNOSIS — G4733 Obstructive sleep apnea (adult) (pediatric): Secondary | ICD-10-CM | POA: Diagnosis not present

## 2022-09-24 DIAGNOSIS — Z951 Presence of aortocoronary bypass graft: Secondary | ICD-10-CM

## 2022-09-24 DIAGNOSIS — J849 Interstitial pulmonary disease, unspecified: Secondary | ICD-10-CM | POA: Diagnosis not present

## 2022-09-24 NOTE — Progress Notes (Signed)
Please let patient know chest x-ray showed stable chronic interstitial lung disease/pulmonary fibrosis.  No super

## 2022-09-24 NOTE — Patient Instructions (Addendum)
You have been scheduled for an endoscopy. Please follow written instructions given to you at your visit today. If you use inhalers (even only as needed), please bring them with you on the day of your procedure.  If you are age 52 or older, your body mass index should be between 23-30. Your Body mass index is 29.29 kg/m. If this is out of the aforementioned range listed, please consider follow up with your Primary Care Provider.  If you are age 70 or younger, your body mass index should be between 19-25. Your Body mass index is 29.29 kg/m. If this is out of the aformentioned range listed, please consider follow up with your Primary Care Provider.   __________________________________________________________  The [facility] providers would like to encourage you to use [facility] to communicate with providers for non-urgent requests or questions.  Due to long hold times on the telephone, sending your provider a message by [facility] may be a faster and more efficient way to get a response.  Please allow 48 business hours for a response.  Please remember that this is for non-urgent requests.   Due to recent changes in healthcare laws, you may see the results of your imaging and laboratory studies on MyChart before your provider has had a chance to review them.  We understand that in some cases there may be results that are confusing or concerning to you. Not all laboratory results come back in the same time frame and the provider may be waiting for multiple results in order to interpret others.  Please give [place] 48 hours in order for your provider to thoroughly review all the results before contacting the office for clarification of your results.    Thank you for choosing me and [facility].  Vito Cirigliano, D.O.

## 2022-09-24 NOTE — Progress Notes (Signed)
Chief Complaint:    GERD, discuss antireflux surgical options (TIF)  GI History: 51 year old male with a history of SLE, Sjogren's syndrome, ILD, OSA on CPAP, ADHD, history of PE, CAD  s/p 1V CABG 09/2021, depression, anxiety, avascular necrosis due to steroid use with chronic pain, follows in pain management clinic, history of drug use.  Follows in the GI clinic for longstanding history of reflux.  Reflux symptoms largely well controlled Prilosec 40 mg bid.  Will have breakthrough symptoms with any missed doses.  Breakthrough with previous attempts to titrate to lower dose.  History of dysphagia, that resolved when starting high-dose PPI.  Patient has been interested in antireflux surgical options as a means to better control his reflux, and potentially reduce the need for acid suppression therapy, particular with his concerns about bone fracture with his prior history of avascular necrosis.   Had previously evaluated for TIF in 03/2019 and had patient set up for procedure at that time.  TIF was scheduled for 01/2020, but was canceled after patient was admitted for hallucinations 2/2 methamphetamine and cocaine use.  He subsequently enrolled in recovery program with regular appointments with substance abuse counselor and monthly follow-up with Dr. Mechele Dawley (Pain Medicine).   GERD history: -Index symptoms: Heartburn, regurgitation -Exacerbating features: Greasy food, sugary food, eating close to bedtime -Medications trialed:  -Current medications: Prilosec 40 mg bid -Complications: Erosive esophagitis, hiatal hernia   GERD evaluation: -Last EGD: 04/2019 -Barium esophagram: -Esophageal Manometry: 06/2019: Incomplete bolus clearance with 7/10 swallows.  Normal peristalsis on 10/10 swallows.  1.6 cm HH -pH/Impedance: N/A -Bravo: N/A   Endoscopic History: - EGD (10/2013, Dr. Benson Norway): Mild erosive esophagitis, "probable" 3 cm sliding hiatal hernia - EGD (08/2017, Dr. Hilarie Fredrickson): Erosive esophagitis,  linear ulcer in lower esophagus (biopsy with reflux changes with ulcer), 1 to 2 cm HH - EGD (04/2019, Dr. Bryan Lemma): LA Grade A esophagitis, <2 cm HH, Hill Grade 2 valve, mild gastritis  GERD-HRQL Questionnaire Score: 23/50, marked "satisfied" with current health condition  HPI:     Patient is a 51 y.o. male presenting to the Gastroenterology Clinic for follow-up.  Last seen by me on 07/18/2021.  At that time, recommended repeat upper endoscopy for preoperative assessment to evaluate for worsening hernia which could necessitate concomitant laparoscopic hiatal hernia repair.  Patient never showed.    Subsequently underwent single-vessel CABG on 10/17/2021 at [facility].  Presents to the office today to again discuss potential TIF. GERD largely controlled with Prilosec 40 mg BID, but breakthrough with any missed dose. Otherwise, rare breakthrough, and uses Tums prn for that.  Has tried dietary mods without much improvement.  Again would like to proceed with antireflux surgical options as a means to stop or significantly reduce the need for acid suppression therapy.  Follows in the Pulmonary Clinic for OSA, ILD.  Last seen 09/23/2022.    Continues to follow closely with Dr. Mechele Dawley at [facility].  Currently treated with intrathecal hydromorphone.  Follows with rheumatology at [facility] for SLE, Sjogren's.  Last seen 09/2021. Per patient, planning to restart Cellcept soon due to decreasing pulmonary fx.   No longer taking ASA 81 mg/day.  Otherwise, not taking any anticoagulation or antiplatelet therapy.   Review of systems:     No chest pain, no SOB, no fevers, no urinary sx   Past Medical History:  Diagnosis Date   Angina pectoris ([place]) 10/06/2021   Anxiety    Asthma    early adulthood/ exercise induced  Atherosclerosis of aorta ([place]) 03/26/2021   Attention deficit hyperactivity disorder (ADHD) 04/03/2008   Qualifier: Diagnosis of  By: Sherlynn Stalls, CMA, Cindy     Avascular bone  necrosis ([place]) 02/07/2020   Avascular necrosis ([place])    of knees from long term steroid use   Backache 07/22/2007   Qualifier: Diagnosis of  By: Sherlynn Stalls, CMA, Cindy     Bladder neck obstruction 03/23/2011   Bone pain    chronic   CAD (coronary artery disease) severe LAD disease need for bypass 10/15/2021   Chronic kidney disease    just 1 kidney congenital   Chronic steroid use 11/10/2013   Coffee ground emesis 11/10/2013   Coronary atherosclerosis 10/06/2021   DEPRESSIVE DISORDER, RCR, MODERATE 06/30/2007   Qualifier: Diagnosis of  By: Sarajane Jews MD, Ishmael Holter    DERMATOMYOSITIS 04/27/2007   Qualifier: Diagnosis of  By: Sherlynn Stalls, CMA, Cindy     Diarrhea 11/10/2013   Esophageal ulcer    ETHMOID SINUSITIS 04/03/2008   Qualifier: Diagnosis of  By: Sherlynn Stalls, CMA, Cindy     Extremity pain 04/04/2013   Generalized anxiety disorder 04/03/2008   Qualifier: Diagnosis of  By: Sherlynn Stalls, CMA, Cindy     GERD 04/03/2008   Qualifier: Diagnosis of  By: Sherlynn Stalls, CMA, Cindy     GI bleed 11/09/2013   H/O long-term treatment with high-risk medication 02/22/2012   HEADACHE 10/01/2010   Qualifier: Diagnosis of  By: Sarajane Jews MD, Stephen A    Hematemesis 11/10/2013   Hiatal hernia    History of pulmonary embolism 10/18/2018   HLD (hyperlipidemia) 10/15/2021   HYPERHIDROSIS 04/27/2007   Qualifier: Diagnosis of  By: Sherlynn Stalls, CMA, Cindy     Hypogonadism, male 03/26/2009   Qualifier: Diagnosis of  By: Loanne Drilling MD, Sean A    Impotence of organic origin 03/01/2012   Insomnia    INTERSTITIAL LUNG DISEASE 04/27/2007   Qualifier: Diagnosis of  By: Sherlynn Stalls, CMA, [place], CHRONIC 04/27/2007   Qualifier: Diagnosis of  By: Sherlynn Stalls, CMA, Cindy     Low back pain    LUPUS 04/27/2007   Qualifier: Diagnosis of  By: Sherlynn Stalls, CMA, Cindy     N&V (nausea and vomiting) 11/10/2013   Neck pain 12/19/2012   OSA on CPAP 10/27/2016   Pain 02/25/2016   Pain syndrome, chronic 12/05/2012   PERICARDITIS 04/27/2007   Qualifier:  Diagnosis of  By: Sherlynn Stalls, CMA, Cindy     PITUITARY INSUFFICIENCY 03/26/2009   Qualifier: Diagnosis of  By: Loanne Drilling MD, Sean A    PLEURISY 04/27/2007   Qualifier: Diagnosis of  By: Sherlynn Stalls, CMA, Cindy     Presence of intrathecal pump 01/30/2020   Formatting of this note might be different from the original. Added automatically from request for surgery [id number]   Pulmonary embolism ([place]) 09/03/2011   Formatting of this note might be different from the original. somewhere between 2003-07 - took blood thinners for several months, then stopped   PULMONARY EMBOLISM, HX OF 04/27/2007   Qualifier: Diagnosis of  By: Sherlynn Stalls, CMA, Cindy     Pulmonary fibrosis ([place])    RAYNAUD'S DISEASE 05/28/2009   Qualifier: Diagnosis of  By: Niel Hummer MD, Lorinda Creed    S/P CABG x 1 10/19/2021   Formatting of this note might be different from the original. LIMA-LAD 10/18/21   Sjogren's syndrome ([place])    Solitary kidney, congenital 02/07/2020   Substance abuse ([place]) 12/06/2019   Seen 12/01/2019 at [facility] for hallucinations after ingestion of  meth taken 11/28/2019.   Unstable angina ([place]) 10/14/2021    Patient's surgical history, family medical history, social history, medications and allergies were all reviewed in Epic    Current Outpatient Medications  Medication Sig Dispense Refill   ALPRAZolam (XANAX) 1 MG tablet TAKE ONE TABLET BY MOUTH THREE TIMES A DAY AS NEEDED FOR ANXIETY 90 tablet 5   amphetamine-dextroamphetamine (ADDERALL) 30 MG tablet Take 1 tablet by mouth 2 (two) times daily. 60 tablet 0   omeprazole (PRILOSEC) 40 MG capsule Take 1 capsule (40 mg total) by mouth 2 (two) times daily. 60 capsule 0   ondansetron (ZOFRAN) 4 MG/2ML SOLN injection Inject 2 mLs (4 mg total) into the vein every 6 (six) hours as needed for nausea. 2 mL 0   rosuvastatin (CRESTOR) 40 MG tablet Take 1 tablet (40 mg total) by mouth daily.     tadalafil (CIALIS) 20 MG tablet Take 20 mg by mouth daily as needed.     testosterone  cypionate (DEPOTESTOSTERONE CYPIONATE) 200 MG/ML injection Inject 0.5 mLs into the muscle once a week.     acetaminophen (TYLENOL) 325 MG tablet Take 2 tablets (650 mg total) by mouth every 4 (four) hours as needed for headache or mild pain. (Patient not taking: Reported on 09/24/2022)     ALPRAZolam (XANAX) 0.5 MG tablet Take 1 tablet (0.5 mg total) by mouth daily as needed for anxiety. (Patient not taking: Reported on 09/24/2022)  0   amoxicillin-clavulanate (AUGMENTIN) 875-125 MG tablet Take 1 tablet by mouth 2 (two) times daily. (Patient not taking: Reported on 09/24/2022) 20 tablet 0   aspirin EC 81 MG EC tablet Take 1 tablet (81 mg total) by mouth daily. Swallow whole. (Patient not taking: Reported on 09/24/2022) 30 tablet 11   dicyclomine (BENTYL) 10 MG capsule Take 1 capsule (10 mg total) by mouth every 6 (six) hours as needed (abdominal discomfort). (Patient not taking: Reported on 09/24/2022) 60 capsule 3   fluconazole (DIFLUCAN) 150 MG tablet Take 1 tablet (150 mg total) by mouth as needed. (Patient not taking: Reported on 09/24/2022) 30 tablet 5   fluticasone furoate-vilanterol (BREO ELLIPTA) 100-25 MCG/ACT AEPB Inhale 1 puff into the lungs daily. (Patient not taking: Reported on 09/24/2022) 30 each 5   sodium chloride 0.9 % infusion Inject 250 mLs into the vein as needed (for IV line care  (Saline / Heparin Lock)). (Patient not taking: Reported on 09/24/2022) 500 mL 0   No current facility-administered medications for this visit.    Physical Exam:     BP 122/80   Pulse 73   Ht 5\' 7"  (1.702 m)   Wt 187 lb (84.8 kg)   SpO2 97%   BMI 29.29 kg/m   GENERAL:  Pleasant male in NAD PSYCH: : Cooperative, normal affect Musculoskeletal:  Normal muscle tone, normal strength NEURO: Alert and oriented x 3, no focal neurologic deficits   IMPRESSION and PLAN:    1) GERD with erosive esophagitis 2) Hiatal hernia  - Repeat EGD now for preoperative assessment to evaluate for change in hernia  size which could necessitate change in surgical plan - Evaluate for severe erosive esophagitis - Continue Prilosec 40 mg BID.   - Continue antireflux lifestyle/dietary modifications - Again discussed the risks, benefits, alternatives of TIF at length today, to include possibility of concomitant laparoscopic hiatal hernia repair if hernia has increased in size.  Discussed surgical antireflux options.  Based on his recent history of one of the CABG and underlying ILD, if  requiring more advanced antireflux surgery, might be better off referring to [facility] for purely surgical repair.  We will make that determination after preoperative EGD.  3) ILD 4) OSA - We again discussed potential overlap between uncontrolled reflux and ILD with literature describing benefit and antireflux surgery - Continue regular follow-up in the Pulmonary clinic - Will send message to our Anesthesia staff to review appropriateness of EGD in [place] vs hospital-based procedure  5) Sjogren's 6) Raynaud's 7) SLE - Schedule follow-up with [facility].  Per patient, potentially planning on restarting CellCept - As above, depending on endoscopic findings, might be more beneficial to refer to academic surgical center for antireflux surgery - Previously discussed postoperative healing in context of history of SLE - Patient to make his Rheumatologist aware of potential elective antireflux surgery after EGD results and discussion of appropriate surgical options  8) Chronic pain syndrome 9) History of avascular necrosis - Has intrathecal pain pump, but no additional oral pain medications - Has regular follow-up with Dr. Mechele Dawley in Pain Management - Per my prior discussion with Dr. Mechele Dawley, postoperative pain management would be through his clinic due to complex history   10) CAD s/p CABG - Would require Cardiac clearance to proceed with any surgery    -He is no longer taking ASA 81 mg.  I recommended he discuss that with  Cardiothoracic Surgery and/or Cardiology.  Does not look like he has been seen by either since the immediate postoperative setting  The indications, risks, and benefits of EGD were explained to the patient in detail. Risks include but are not limited to bleeding, perforation, adverse reaction to medications, and cardiopulmonary compromise. Sequelae include but are not limited to the possibility of surgery, hospitalization, and mortality. The patient verbalized understanding and wished to proceed. All questions answered, referred to scheduler. Further recommendations pending results of the exam.   I spent 40 minutes of time, including in depth chart review, independent review of results as outlined above, communicating results with the patient directly, face-to-face time with the patient, coordinating care, ordering studies and medications as appropriate, and documentation.    [place] ,DO, FACG 09/24/2022, 11:03 AM

## 2022-09-24 NOTE — Progress Notes (Signed)
No acute process

## 2022-09-25 NOTE — Progress Notes (Signed)
Reviewed and agree with assessment/plan.   Cap Massi, MD [place] 09/25/2022, 7:43 AM Pager:  [phone]  

## 2022-10-01 ENCOUNTER — Telehealth: Payer: Self-pay

## 2022-10-01 ENCOUNTER — Other Ambulatory Visit (HOSPITAL_COMMUNITY): Payer: Self-pay

## 2022-10-01 NOTE — Telephone Encounter (Signed)
PA for Breo received via CMM from [facility].  Per benefits investigation the patient seems to have another insurance policy through [place] which covers the [place] at a $4.30 co-pay.  PA was not submitted through [facility].  KeyOlevia Perches - PA Case ID: [id number]

## 2022-10-01 NOTE — Progress Notes (Signed)
Should see [facility]- they will discuss cellcept. Can we order HRCT re: ILD - needs to be done prior to visit with Dr. Craige Cotta in February

## 2022-10-02 ENCOUNTER — Other Ambulatory Visit: Payer: Self-pay

## 2022-10-02 DIAGNOSIS — J849 Interstitial pulmonary disease, unspecified: Secondary | ICD-10-CM

## 2022-10-08 ENCOUNTER — Ambulatory Visit (AMBULATORY_SURGERY_CENTER): Payer: Medicaid Other | Admitting: Gastroenterology

## 2022-10-08 ENCOUNTER — Encounter: Payer: Self-pay | Admitting: Gastroenterology

## 2022-10-08 VITALS — BP 106/73 | HR 76 | Temp 98.0°F | Resp 16 | Ht 67.0 in | Wt 187.0 lb

## 2022-10-08 DIAGNOSIS — Z978 Presence of other specified devices: Secondary | ICD-10-CM | POA: Diagnosis not present

## 2022-10-08 DIAGNOSIS — K2100 Gastro-esophageal reflux disease with esophagitis, without bleeding: Secondary | ICD-10-CM

## 2022-10-08 DIAGNOSIS — G894 Chronic pain syndrome: Secondary | ICD-10-CM | POA: Diagnosis not present

## 2022-10-08 DIAGNOSIS — Z462 Encounter for fitting and adjustment of other devices related to nervous system and special senses: Secondary | ICD-10-CM | POA: Diagnosis not present

## 2022-10-08 DIAGNOSIS — K219 Gastro-esophageal reflux disease without esophagitis: Secondary | ICD-10-CM

## 2022-10-08 DIAGNOSIS — M5030 Other cervical disc degeneration, unspecified cervical region: Secondary | ICD-10-CM | POA: Diagnosis not present

## 2022-10-08 DIAGNOSIS — K449 Diaphragmatic hernia without obstruction or gangrene: Secondary | ICD-10-CM | POA: Diagnosis not present

## 2022-10-08 MED ORDER — SODIUM CHLORIDE 0.9 % IV SOLN: 500.0000 mL | Freq: Once | INTRAVENOUS | Status: DC
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
## 2022-10-08 NOTE — Progress Notes (Signed)
To pacu, VSS. Report to [email or web address].tb 

## 2022-10-08 NOTE — Op Note (Signed)
[facility] Patient Name: [name] Procedure Date: 10/08/2022 9:43 AM MRN: [id number] Endoscopist: Doristine Locks , MD, [id number] Age: 51 Referring MD:  Date of Birth: 1971-05-06 Gender: Male Account #: [id number] Procedure:                Upper GI endoscopy Indications:              Heartburn, Esophageal reflux, Preoperative                            assessment for antireflux surgery Medicines:                Monitored Anesthesia Care Procedure:                Pre-Anesthesia Assessment:                           - Prior to the procedure, a History and Physical                            was performed, and patient medications and                            allergies were reviewed. The patient's tolerance of                            previous anesthesia was also reviewed. The risks                            and benefits of the procedure and the sedation                            options and risks were discussed with the patient.                            All questions were answered, and informed consent                            was obtained. Prior Anticoagulants: The patient has                            taken no anticoagulant or antiplatelet agents. ASA                            Grade Assessment: III - A patient with severe                            systemic disease. After reviewing the risks and                            benefits, the patient was deemed in satisfactory                            condition to undergo the procedure.  After obtaining informed consent, the endoscope was                            passed under direct vision. Throughout the                            procedure, the patient's blood pressure, pulse, and                            oxygen saturations were monitored continuously. The                            Endoscope was introduced through the mouth, and                            advanced to the  second part of duodenum. The upper                            GI endoscopy was accomplished without difficulty.                            The patient tolerated the procedure well. Scope In: Scope Out: Findings:                 The examined esophagus was normal.                           The Z-line was regular and was found 40 cm from the                            incisors.                           The gastroesophageal flap valve was visualized                            endoscopically and classified as Hill Grade II                            (fold present, opens with respiration).                           A 1 cm sliding type hiatal hernia was present.                           The entire examined stomach was normal.                           The examined duodenum was normal. Complications:            No immediate complications. Estimated Blood Loss:     Estimated blood loss: none. Impression:               - Normal esophagus.                           -  Z-line regular, 40 cm from the incisors.                           - Gastroesophageal flap valve classified as Hill                            Grade II (fold present, opens with respiration).                           - 1 cm hiatal hernia.                           - Normal stomach.                           - Normal examined duodenum.                           - No specimens collected. Recommendation:           - Patient has a contact number available for                            emergencies. The signs and symptoms of potential                            delayed complications were discussed with the                            patient. Return to normal activities tomorrow.                            Written discharge instructions were provided to the                            patient.                           - Resume previous diet.                           - Continue present medications.                           - Schedule  follow-up appointment with Cardiology                            for continued medical management after prior CABG.                            Will need Cardiology clearance to proceed with any                            antireflux surgery, in this case potential                            Transoral Incisionless Fundoplication (  TIF).                           - Continue follow-up in the Pulmonary Clinic as                            scheduled. If planning to proceed with TIF, will                            plan to request formal Pulmonary clearance to                            proceed.                           - Return to GI clinic at appointment to be                            scheduled. Pending Cardiology and Pulmonary                            clearance, will discuss planning and scheduling for                            TIF.                           - Continue antireflux medications and antireflux                            lifestyle/dietary modifications in the interim. Doristine Locks, MD 10/08/2022 10:10:40 AM

## 2022-10-08 NOTE — Patient Instructions (Addendum)
-Continue present medications  - Schedule follow-up appointment with Cardiology for continued medical management after prior CABG. Will need Cardiology clearance to proceed with any antireflux surgery, in this case potential Transoral Incisionless Fundoplication (TIF).  - Continue follow-up in the Pulmonary Clinic as scheduled. If planning to proceed with TIF, will plan to request formal Pulmonary clearance to proceed.  - Return to GI clinic at appointment to be scheduled. Pending Cardiology and Pulmonary clearance, will discuss planning and scheduling for TIF.  - Continue antireflux medications and antireflux lifestyle/dietary modifications in the interim.  INFO GIVEN ON GERD AND HIATAL HERNIA.   YOU HAD AN ENDOSCOPIC PROCEDURE TODAY AT THE [facility]:   Refer to the procedure report that was given to you for any specific questions about what was found during the examination.  If the procedure report does not answer your questions, please call your gastroenterologist to clarify.  If you requested that your care partner not be given the details of your procedure findings, then the procedure report has been included in a sealed envelope for you to review at your convenience later.  YOU SHOULD EXPECT: Some feelings of bloating in the abdomen. Passage of more gas than usual.  Walking can help get rid of the air that was put into your GI tract during the procedure and reduce the bloating. If you had a lower endoscopy (such as a colonoscopy or flexible sigmoidoscopy) you may notice spotting of blood in your stool or on the toilet paper. If you underwent a bowel prep for your procedure, you may not have a normal bowel movement for a few days.  Please Note:  You might notice some irritation and congestion in your nose or some drainage.  This is from the oxygen used during your procedure.  There is no need for concern and it should clear up in a day or so.  SYMPTOMS TO REPORT  IMMEDIATELY:  Following upper endoscopy (EGD)  Vomiting of blood or coffee ground material  New chest pain or pain under the shoulder blades  Painful or persistently difficult swallowing  New shortness of breath  Fever of 100F or higher  Black, tarry-looking stools  For urgent or emergent issues, a gastroenterologist can be reached at any hour by calling [phone]. Do not use MyChart messaging for urgent concerns.    DIET:  We do recommend a small meal at first, but then you may proceed to your regular diet.  Drink plenty of fluids but you should avoid alcoholic beverages for 24 hours.  ACTIVITY:  You should plan to take it easy for the rest of today and you should NOT DRIVE or use heavy machinery until tomorrow (because of the sedation medicines used during the test).    FOLLOW UP: Our staff will call the number listed on your records the next business day following your procedure.  We will call around 7:15- 8:00 am to check on you and address any questions or concerns that you may have regarding the information given to you following your procedure. If we do not reach you, we will leave a message.     If any biopsies were taken you will be contacted by phone or by letter within the next 1-3 weeks.  Please call us at [phone] if you have not heard about the biopsies in 3 weeks.    SIGNATURES/CONFIDENTIALITY: You and/or your care partner have signed paperwork which will be entered into your electronic medical record.  These signatures attest  to the fact that that the information above on your After Visit Summary has been reviewed and is understood.  Full responsibility of the confidentiality of this discharge information lies with you and/or your care-partner. 

## 2022-10-08 NOTE — Progress Notes (Signed)
Pt's states no medical or surgical changes since previsit or office visit. 

## 2022-10-09 ENCOUNTER — Telehealth: Payer: Self-pay | Admitting: *Deleted

## 2022-10-09 NOTE — Telephone Encounter (Signed)
  Follow up Call-     10/08/2022    9:30 AM  Call back number  Post procedure Call Back phone  # [phone]  Permission to leave phone message Yes     Patient questions:  Do you have a fever, pain , or abdominal swelling? No. Pain Score  0 *  Have you tolerated food without any problems? Yes.    Have you been able to return to your normal activities? Yes.    Do you have any questions about your discharge instructions: Diet   No. Medications  No. Follow up visit  No.  Do you have questions or concerns about your Care? No.  Actions: * If pain score is 4 or above: No action needed, pain <4.

## 2022-10-12 ENCOUNTER — Other Ambulatory Visit: Payer: Self-pay | Admitting: Family Medicine

## 2022-10-12 NOTE — Telephone Encounter (Signed)
Pt LOV was on 05/12/22 Last refill was done on 09/02/22 Please advise

## 2022-10-13 DIAGNOSIS — G4733 Obstructive sleep apnea (adult) (pediatric): Secondary | ICD-10-CM | POA: Diagnosis not present

## 2022-10-13 MED ORDER — AMPHETAMINE-DEXTROAMPHETAMINE 30 MG PO TABS: 30.0000 mg | ORAL_TABLET | Freq: Two times a day (BID) | ORAL | 0 refills | Status: DC

## 2022-10-13 NOTE — Telephone Encounter (Signed)
Done

## 2022-10-21 ENCOUNTER — Encounter: Payer: Self-pay | Admitting: Family Medicine

## 2022-10-21 MED ORDER — AMPHETAMINE-DEXTROAMPHETAMINE 20 MG PO TABS: 30.0000 mg | ORAL_TABLET | Freq: Two times a day (BID) | ORAL | 0 refills | Status: DC

## 2022-10-21 NOTE — Telephone Encounter (Signed)
I sent in the 20 mg size to take 1 and 1/2 BID

## 2022-10-23 DIAGNOSIS — K219 Gastro-esophageal reflux disease without esophagitis: Secondary | ICD-10-CM | POA: Diagnosis not present

## 2022-10-23 DIAGNOSIS — Z462 Encounter for fitting and adjustment of other devices related to nervous system and special senses: Secondary | ICD-10-CM | POA: Diagnosis not present

## 2022-10-23 DIAGNOSIS — Z451 Encounter for adjustment and management of infusion pump: Secondary | ICD-10-CM | POA: Diagnosis not present

## 2022-10-23 DIAGNOSIS — G4730 Sleep apnea, unspecified: Secondary | ICD-10-CM | POA: Diagnosis not present

## 2022-10-23 DIAGNOSIS — G894 Chronic pain syndrome: Secondary | ICD-10-CM | POA: Diagnosis not present

## 2022-12-02 DIAGNOSIS — Z978 Presence of other specified devices: Secondary | ICD-10-CM | POA: Diagnosis not present

## 2022-12-02 DIAGNOSIS — G894 Chronic pain syndrome: Secondary | ICD-10-CM | POA: Diagnosis not present

## 2022-12-02 DIAGNOSIS — M5440 Lumbago with sciatica, unspecified side: Secondary | ICD-10-CM | POA: Diagnosis not present

## 2022-12-04 ENCOUNTER — Ambulatory Visit: Payer: Medicaid Other | Admitting: Gastroenterology

## 2022-12-07 ENCOUNTER — Telehealth: Payer: Self-pay

## 2022-12-07 NOTE — Telephone Encounter (Signed)
In office appointment made patient agreeable and voiced understanding.

## 2022-12-07 NOTE — Telephone Encounter (Signed)
   Name: [name]  DOB: [birth year]  MRN: [id number]  Primary Cardiologist: Jenean Lindau, MD  Chart reviewed as part of pre-operative protocol coverage. Because of [name] past medical history and time since last visit, he will require a follow-up in-office visit in order to better assess preoperative cardiovascular risk.  Pre-op covering staff: - Please schedule appointment and call patient to inform them. If patient already had an upcoming appointment within acceptable timeframe, please add "pre-op clearance" to the appointment notes so provider is aware. - Please contact requesting surgeon's office via preferred method (i.e, phone, fax) to inform them of need for appointment prior to surgery.  None requested  Mable Fill, Marissa Nestle, NP  12/07/2022, 3:14 PM

## 2022-12-07 NOTE — Telephone Encounter (Signed)
   Pre-operative Risk Assessment    Patient Name: [name]  DOB: [birth year] MRN: [id number]      Request for Surgical Clearance    Procedure:   bilateral maxillary antrostomy, ethmoidectomy with frontal sinusotomy and sphenoidotomy  Date of Surgery:  Clearance TBD                                 Surgeon:  Dr. Al Pimple Surgeon's Group or Practice Name:  [facility] Phone number:  [phone]  Fax number:  [phone]   Type of Clearance Requested:   - Medical    Type of Anesthesia:  General    Additional requests/questions:    SignedLowella Grip   12/07/2022, 2:03 PM

## 2022-12-08 ENCOUNTER — Other Ambulatory Visit: Payer: Medicaid Other

## 2022-12-11 ENCOUNTER — Ambulatory Visit (INDEPENDENT_AMBULATORY_CARE_PROVIDER_SITE_OTHER): Payer: Medicaid Other

## 2022-12-11 DIAGNOSIS — M329 Systemic lupus erythematosus, unspecified: Secondary | ICD-10-CM

## 2022-12-11 DIAGNOSIS — J849 Interstitial pulmonary disease, unspecified: Secondary | ICD-10-CM

## 2022-12-11 DIAGNOSIS — R918 Other nonspecific abnormal finding of lung field: Secondary | ICD-10-CM | POA: Diagnosis not present

## 2022-12-11 DIAGNOSIS — J84112 Idiopathic pulmonary fibrosis: Secondary | ICD-10-CM | POA: Diagnosis not present

## 2022-12-11 DIAGNOSIS — R590 Localized enlarged lymph nodes: Secondary | ICD-10-CM | POA: Diagnosis not present

## 2022-12-11 DIAGNOSIS — J479 Bronchiectasis, uncomplicated: Secondary | ICD-10-CM | POA: Diagnosis not present
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
## 2022-12-14 ENCOUNTER — Encounter: Payer: Self-pay | Admitting: Nurse Practitioner

## 2022-12-14 ENCOUNTER — Ambulatory Visit: Payer: Medicaid Other | Attending: Nurse Practitioner | Admitting: Nurse Practitioner

## 2022-12-14 VITALS — BP 104/68 | HR 82 | Ht 67.0 in | Wt 183.2 lb

## 2022-12-14 DIAGNOSIS — I10 Essential (primary) hypertension: Secondary | ICD-10-CM | POA: Insufficient documentation

## 2022-12-14 DIAGNOSIS — I25810 Atherosclerosis of coronary artery bypass graft(s) without angina pectoris: Secondary | ICD-10-CM | POA: Insufficient documentation

## 2022-12-14 DIAGNOSIS — J8410 Pulmonary fibrosis, unspecified: Secondary | ICD-10-CM | POA: Insufficient documentation

## 2022-12-14 DIAGNOSIS — Z01810 Encounter for preprocedural cardiovascular examination: Secondary | ICD-10-CM | POA: Insufficient documentation

## 2022-12-14 NOTE — Patient Instructions (Addendum)
Medication Instructions:  Your physician recommends that you continue on your current medications as directed. Please refer to the Current Medication list given to you today. *If you need a refill on your cardiac medications before your next appointment, please call your pharmacy*  Lab Work: None Ordered  Testing/Procedures: None Ordered  Follow-Up: At [facility], you and your health needs are our priority.  As part of our continuing mission to provide you with exceptional heart care, we have created designated Provider Care Teams.  These Care Teams include your primary Cardiologist (physician) and Advanced Practice Providers (APPs -  Physician Assistants and Nurse Practitioners) who all work together to provide you with the care you need, when you need it.  We recommend signing up for the patient portal called "MyChart".  Sign up information is provided on this After Visit Summary.  MyChart is used to connect with patients for Virtual Visits (Telemedicine).  Patients are able to view lab/test results, encounter notes, upcoming appointments, etc.  Non-urgent messages can be sent to your provider as well.   To learn more about what you can do with MyChart, go to [email or web address].    Your next appointment:   2-3 month(s)  Provider:   Jyl Heinz, MD    Other Instructions  YOU ARE CLEARED FOR YOUR PROCEDURE.

## 2022-12-14 NOTE — Progress Notes (Signed)
HRCT showed ILD is slightly worse since 2020. Keep apt with Dr. Halford Chessman on feb 5th   Make sure he has had a recent visit with [facility], he was last seen in Nov 2022 and they wanted to see him back - there was mention of restarting cellcept d/t progression of lung disease.

## 2022-12-28 ENCOUNTER — Encounter (HOSPITAL_BASED_OUTPATIENT_CLINIC_OR_DEPARTMENT_OTHER): Payer: Self-pay | Admitting: Pulmonary Disease

## 2022-12-28 ENCOUNTER — Ambulatory Visit (INDEPENDENT_AMBULATORY_CARE_PROVIDER_SITE_OTHER): Payer: Self-pay | Admitting: Pulmonary Disease

## 2022-12-28 ENCOUNTER — Ambulatory Visit (HOSPITAL_BASED_OUTPATIENT_CLINIC_OR_DEPARTMENT_OTHER): Payer: Self-pay | Admitting: Pulmonary Disease

## 2022-12-28 VITALS — BP 116/82 | HR 97 | Temp 99.0°F | Ht 67.0 in | Wt 179.8 lb

## 2022-12-28 DIAGNOSIS — J849 Interstitial pulmonary disease, unspecified: Secondary | ICD-10-CM

## 2022-12-28 DIAGNOSIS — J4540 Moderate persistent asthma, uncomplicated: Secondary | ICD-10-CM

## 2022-12-28 DIAGNOSIS — G4733 Obstructive sleep apnea (adult) (pediatric): Secondary | ICD-10-CM

## 2022-12-28 MED ORDER — FLUTICASONE FUROATE-VILANTEROL 100-25 MCG/ACT IN AEPB: 1.0000 | INHALATION_SPRAY | Freq: Every day | RESPIRATORY_TRACT | 5 refills | Status: DC

## 2022-12-28 NOTE — Patient Instructions (Signed)
Follow up in 4 months 
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
## 2022-12-29 ENCOUNTER — Encounter: Payer: Self-pay | Admitting: Pulmonary Disease

## 2023-01-04 DIAGNOSIS — Q600 Renal agenesis, unilateral: Secondary | ICD-10-CM | POA: Diagnosis not present

## 2023-01-04 DIAGNOSIS — Z951 Presence of aortocoronary bypass graft: Secondary | ICD-10-CM | POA: Diagnosis not present

## 2023-01-04 DIAGNOSIS — J849 Interstitial pulmonary disease, unspecified: Secondary | ICD-10-CM | POA: Diagnosis not present

## 2023-01-04 DIAGNOSIS — J329 Chronic sinusitis, unspecified: Secondary | ICD-10-CM | POA: Diagnosis not present

## 2023-01-11 DIAGNOSIS — N189 Chronic kidney disease, unspecified: Secondary | ICD-10-CM | POA: Diagnosis not present

## 2023-01-11 DIAGNOSIS — G4730 Sleep apnea, unspecified: Secondary | ICD-10-CM | POA: Diagnosis not present

## 2023-01-11 DIAGNOSIS — J323 Chronic sphenoidal sinusitis: Secondary | ICD-10-CM | POA: Diagnosis not present

## 2023-01-11 DIAGNOSIS — J3489 Other specified disorders of nose and nasal sinuses: Secondary | ICD-10-CM | POA: Diagnosis not present

## 2023-01-11 DIAGNOSIS — J329 Chronic sinusitis, unspecified: Secondary | ICD-10-CM | POA: Diagnosis not present

## 2023-01-11 DIAGNOSIS — K219 Gastro-esophageal reflux disease without esophagitis: Secondary | ICD-10-CM | POA: Diagnosis not present

## 2023-01-11 DIAGNOSIS — I2510 Atherosclerotic heart disease of native coronary artery without angina pectoris: Secondary | ICD-10-CM | POA: Diagnosis not present

## 2023-01-13 ENCOUNTER — Ambulatory Visit: Payer: Medicaid Other | Admitting: Gastroenterology

## 2023-01-19 DIAGNOSIS — J329 Chronic sinusitis, unspecified: Secondary | ICD-10-CM | POA: Diagnosis not present

## 2023-02-02 ENCOUNTER — Ambulatory Visit: Payer: Medicaid Other | Admitting: Family Medicine

## 2023-02-05 DIAGNOSIS — J329 Chronic sinusitis, unspecified: Secondary | ICD-10-CM | POA: Diagnosis not present

## 2023-02-12 DIAGNOSIS — G894 Chronic pain syndrome: Secondary | ICD-10-CM | POA: Diagnosis not present

## 2023-02-12 DIAGNOSIS — M5440 Lumbago with sciatica, unspecified side: Secondary | ICD-10-CM | POA: Diagnosis not present

## 2023-02-12 DIAGNOSIS — Z978 Presence of other specified devices: Secondary | ICD-10-CM | POA: Diagnosis not present

## 2023-03-03 ENCOUNTER — Other Ambulatory Visit: Payer: Self-pay

## 2023-03-04 DIAGNOSIS — J329 Chronic sinusitis, unspecified: Secondary | ICD-10-CM | POA: Diagnosis not present

## 2023-03-05 ENCOUNTER — Ambulatory Visit: Payer: Medicaid Other | Admitting: Gastroenterology

## 2023-03-10 ENCOUNTER — Other Ambulatory Visit: Payer: Self-pay | Admitting: Family Medicine

## 2023-03-11 ENCOUNTER — Ambulatory Visit: Payer: Medicaid Other | Attending: Cardiology | Admitting: Cardiology

## 2023-03-12 ENCOUNTER — Other Ambulatory Visit: Payer: Self-pay

## 2023-03-12 MED ORDER — AMPHETAMINE-DEXTROAMPHETAMINE 20 MG PO TABS
20.0000 mg | ORAL_TABLET | Freq: Every day | ORAL | 0 refills | Status: DC
  Filled 2023-03-12 – 2023-04-22 (×6): qty 30, 30d supply, fill #0

## 2023-03-17 ENCOUNTER — Other Ambulatory Visit: Payer: Self-pay

## 2023-03-29 DIAGNOSIS — G4733 Obstructive sleep apnea (adult) (pediatric): Secondary | ICD-10-CM | POA: Diagnosis not present

## 2023-03-31 ENCOUNTER — Other Ambulatory Visit: Payer: Self-pay | Admitting: Family Medicine

## 2023-04-01 ENCOUNTER — Other Ambulatory Visit: Payer: Self-pay

## 2023-04-01 MED ORDER — ONDANSETRON HCL 4 MG PO TABS
4.0000 mg | ORAL_TABLET | Freq: Three times a day (TID) | ORAL | 0 refills | Status: DC | PRN
  Filled 2023-04-01: qty 60, 20d supply, fill #0

## 2023-04-01 MED ORDER — TADALAFIL 20 MG PO TABS: 20.0000 mg | ORAL_TABLET | Freq: Every day | ORAL | 0 refills | Status: AC | PRN

## 2023-04-01 NOTE — Telephone Encounter (Signed)
Pt LOV was on 05/12/22 Last refill for Rx was on 08/25/22 Pt is requesting for Testosterone and Tadalafil Please advise

## 2023-04-01 NOTE — Telephone Encounter (Signed)
I'm not sure where this came from. I do not treat him for this problem

## 2023-04-01 NOTE — Telephone Encounter (Signed)
Left detailed message for pt advised to call the office and schedule office appointment for possible Testosterone labs and refill

## 2023-04-01 NOTE — Telephone Encounter (Signed)
Message sent to Dr Fry for advise 

## 2023-04-01 NOTE — Telephone Encounter (Signed)
Pt LOV was on 05/12/22 Last refill was done on 10/08/22 by Historical provider Please advise if ok to send Rx

## 2023-04-13 ENCOUNTER — Other Ambulatory Visit: Payer: Self-pay

## 2023-04-15 ENCOUNTER — Other Ambulatory Visit: Payer: Self-pay

## 2023-04-20 ENCOUNTER — Other Ambulatory Visit: Payer: Self-pay

## 2023-04-21 ENCOUNTER — Other Ambulatory Visit (HOSPITAL_BASED_OUTPATIENT_CLINIC_OR_DEPARTMENT_OTHER): Payer: Self-pay

## 2023-04-22 ENCOUNTER — Other Ambulatory Visit: Payer: Self-pay

## 2023-04-22 NOTE — Telephone Encounter (Signed)
Pt is asking for a refill to be sent to:  [id number] [place], [place] - [address] Phone: [phone]  Fax: [phone]

## 2023-04-23 ENCOUNTER — Other Ambulatory Visit: Payer: Self-pay

## 2023-04-23 DIAGNOSIS — J329 Chronic sinusitis, unspecified: Secondary | ICD-10-CM | POA: Diagnosis not present

## 2023-04-23 NOTE — Telephone Encounter (Signed)
Pt called the office requesting for a refill for his Adderall 20 mg sent to [facility] in [place] in [place]. Pt states to send in 90 days supply. Pt is aware that Dr Clent Ridges will be back in the office on 04/26/23 Please advise

## 2023-04-24 DIAGNOSIS — G4733 Obstructive sleep apnea (adult) (pediatric): Secondary | ICD-10-CM | POA: Diagnosis not present

## 2023-04-27 MED ORDER — AMPHETAMINE-DEXTROAMPHETAMINE 20 MG PO TABS: 20.0000 mg | ORAL_TABLET | Freq: Every day | ORAL | 0 refills | Status: DC

## 2023-04-27 NOTE — Telephone Encounter (Signed)
Done

## 2023-04-28 ENCOUNTER — Other Ambulatory Visit: Payer: Self-pay

## 2023-04-30 MED ORDER — AMPHETAMINE-DEXTROAMPHETAMINE 20 MG PO TABS: 20.0000 mg | ORAL_TABLET | Freq: Two times a day (BID) | ORAL | 0 refills | Status: DC

## 2023-04-30 NOTE — Telephone Encounter (Signed)
Please advise 

## 2023-04-30 NOTE — Telephone Encounter (Signed)
I sent in a new RX for #60

## 2023-05-11 ENCOUNTER — Other Ambulatory Visit: Payer: Self-pay

## 2023-05-11 ENCOUNTER — Telehealth: Payer: Self-pay | Admitting: Family Medicine

## 2023-05-11 MED ORDER — AMPHETAMINE-DEXTROAMPHETAMINE 20 MG PO TABS: 20.0000 mg | ORAL_TABLET | Freq: Three times a day (TID) | ORAL | 0 refills | Status: DC

## 2023-05-11 NOTE — Telephone Encounter (Addendum)
Pt states the pharmacy won't fill his Rx for the:   amphetamine-dextroamphetamine (ADDERALL) 20 MG tablet because the start date is incorrect.  Pt did mention he picked up earlier this month.  Pt says MD needs to cancel // revoke all other previous Rxs, and rewrite Rx by how many (i.e.: 90 tablets of 20 mg)...  Pt is also asking that MD not put: "Do not fill until" _______   Pt would like a call back to discuss.  [id number] [place], [place] - [address] AT [address] OF [facility] Phone: [phone]  Fax: [phone]

## 2023-05-11 NOTE — Telephone Encounter (Signed)
I sent in new RX. Please cal to cancel the old ones

## 2023-05-13 ENCOUNTER — Encounter: Payer: Self-pay | Admitting: Family Medicine

## 2023-05-13 NOTE — Telephone Encounter (Signed)
Done

## 2023-05-18 MED ORDER — FLUCONAZOLE 150 MG PO TABS: 150.0000 mg | ORAL_TABLET | ORAL | 5 refills | Status: DC | PRN

## 2023-05-18 NOTE — Telephone Encounter (Signed)
Done

## 2023-05-24 DIAGNOSIS — G4733 Obstructive sleep apnea (adult) (pediatric): Secondary | ICD-10-CM | POA: Diagnosis not present

## 2023-06-10 DIAGNOSIS — G894 Chronic pain syndrome: Secondary | ICD-10-CM | POA: Diagnosis not present

## 2023-06-10 DIAGNOSIS — M5030 Other cervical disc degeneration, unspecified cervical region: Secondary | ICD-10-CM | POA: Diagnosis not present

## 2023-06-10 DIAGNOSIS — M5440 Lumbago with sciatica, unspecified side: Secondary | ICD-10-CM | POA: Diagnosis not present

## 2023-06-10 DIAGNOSIS — M542 Cervicalgia: Secondary | ICD-10-CM | POA: Diagnosis not present

## 2023-06-10 DIAGNOSIS — Z978 Presence of other specified devices: Secondary | ICD-10-CM | POA: Diagnosis not present

## 2023-07-08 DIAGNOSIS — G4733 Obstructive sleep apnea (adult) (pediatric): Secondary | ICD-10-CM | POA: Diagnosis not present

## 2023-07-14 DIAGNOSIS — J329 Chronic sinusitis, unspecified: Secondary | ICD-10-CM | POA: Diagnosis not present

## 2023-07-25 DIAGNOSIS — G4733 Obstructive sleep apnea (adult) (pediatric): Secondary | ICD-10-CM | POA: Diagnosis not present

## 2023-07-28 DIAGNOSIS — J342 Deviated nasal septum: Secondary | ICD-10-CM | POA: Diagnosis not present

## 2023-07-28 DIAGNOSIS — J329 Chronic sinusitis, unspecified: Secondary | ICD-10-CM | POA: Diagnosis not present

## 2023-08-12 ENCOUNTER — Other Ambulatory Visit: Payer: Self-pay | Admitting: Family Medicine

## 2023-08-12 DIAGNOSIS — J329 Chronic sinusitis, unspecified: Secondary | ICD-10-CM | POA: Diagnosis not present

## 2023-08-13 NOTE — Telephone Encounter (Signed)
Pt LOV was on 6/20/2 Last refill was done on 07/11/23 Left pt a  message to schedule a physical

## 2023-08-18 NOTE — Telephone Encounter (Signed)
Left pt a message advised to call the office and schedule OV per Dr Clent Ridges for adderall refill

## 2023-08-23 ENCOUNTER — Ambulatory Visit (INDEPENDENT_AMBULATORY_CARE_PROVIDER_SITE_OTHER): Payer: Medicaid Other | Admitting: Family Medicine

## 2023-08-23 ENCOUNTER — Encounter: Payer: Self-pay | Admitting: Family Medicine

## 2023-08-23 VITALS — BP 112/70 | HR 73 | Temp 98.6°F | Wt 180.0 lb

## 2023-08-23 DIAGNOSIS — I83813 Varicose veins of bilateral lower extremities with pain: Secondary | ICD-10-CM

## 2023-08-23 DIAGNOSIS — F900 Attention-deficit hyperactivity disorder, predominantly inattentive type: Secondary | ICD-10-CM | POA: Diagnosis not present

## 2023-08-23 DIAGNOSIS — I8393 Asymptomatic varicose veins of bilateral lower extremities: Secondary | ICD-10-CM | POA: Insufficient documentation

## 2023-08-23 HISTORY — DX: Asymptomatic varicose veins of bilateral lower extremities: I83.93

## 2023-08-23 MED ORDER — AMPHETAMINE-DEXTROAMPHETAMINE 20 MG PO TABS: 20.0000 mg | ORAL_TABLET | Freq: Three times a day (TID) | ORAL | 0 refills | Status: DC

## 2023-08-23 MED ORDER — ONDANSETRON HCL 4 MG PO TABS: 4.0000 mg | ORAL_TABLET | Freq: Three times a day (TID) | ORAL | 5 refills | Status: AC | PRN

## 2023-08-23 NOTE — Progress Notes (Signed)
Subjective:    Patient ID: [name], male    DOB: 04/01/1971, 52 y.o.   MRN: [id number]  HPI Here for follow up on ADHD and varicose veins. His ADHD has been stable, and the Adderall still works well for him. His weight and BP have been stable. He had varicose vein surgery with [facility] in the past, but they are causing him pain again. He wants to see someone in [place] about this this time around.    Review of Systems  Constitutional: Negative.   Respiratory: Negative.    Cardiovascular: Negative.   Gastrointestinal: Negative.        Objective:   Physical Exam Constitutional:      Appearance: Normal appearance.  Cardiovascular:     Rate and Rhythm: Normal rate and regular rhythm.     Pulses: Normal pulses.     Heart sounds: Normal heart sounds.  Pulmonary:     Effort: Pulmonary effort is normal.     Breath sounds: Normal breath sounds.  Musculoskeletal:     Comments: Numerous varicosities in both legs   Neurological:     General: No focal deficit present.     Mental Status: He is alert and oriented to person, place, and time.           Assessment & Plan:  His ADHD  is stable, and we refilled the Adderall. We will refer him to avascular surgeon in [place] for the varicose veins.  Gershon Crane, MD

## 2023-08-24 ENCOUNTER — Telehealth: Payer: Self-pay | Admitting: Family Medicine

## 2023-08-24 DIAGNOSIS — G4733 Obstructive sleep apnea (adult) (pediatric): Secondary | ICD-10-CM | POA: Diagnosis not present

## 2023-08-24 NOTE — Addendum Note (Signed)
Addended by: Philipp Deputy A on: 08/24/2023 03:44 PM   Modules accepted: Orders

## 2023-08-24 NOTE — Telephone Encounter (Signed)
Regarding referral [phone] says this needs to go to a vascular specialty as they are surgical

## 2023-08-24 NOTE — Telephone Encounter (Signed)
I understand. Please cancel this referral. Have the patient locate a non-surgical vein specialist and we can refer to them

## 2023-08-24 NOTE — Telephone Encounter (Signed)
Called patient unable to leave a voicemail. 

## 2023-08-26 NOTE — Telephone Encounter (Signed)
Please keep me updated

## 2023-08-26 NOTE — Telephone Encounter (Signed)
Pt notified of Dr Clent Ridges advise, states that he will call the office with an update

## 2023-09-02 ENCOUNTER — Telehealth: Payer: Self-pay | Admitting: Pulmonary Disease

## 2023-09-02 NOTE — Telephone Encounter (Signed)
I called and spoke with the pt  He states he has had some sinus issues and has been seeing ENT  They have him on doxy currently  He states every time he stops taking abx he gets cold chills and "feels bad" He also states has bad odor coming from his mouth  He keeps up with replacing CPAP supplies when it's time  He denies any respiratory sx  I advised we need to get him in for ov to discuss anything since he is overdue  He could not schedule visit at this time due to insurance issue  He will call back when able to schedule appt and in the meantime f/u with ENT as planned  Nothing further needed

## 2023-09-02 NOTE — Telephone Encounter (Signed)
Frmr Sood PT. States he has some kind of infection poss from CPAP machine. ENT Dr. could not figure out and put him on antibx. Pls call to advise. He is on antibx now but if he stops he starts to get fever and chills again and he can smell something coming from his breath even his wife can smell it.Bad smell indiscernible.   Req a nurse to call to advise.   Please call to advise [phone]

## 2023-09-29 DIAGNOSIS — M5440 Lumbago with sciatica, unspecified side: Secondary | ICD-10-CM | POA: Diagnosis not present

## 2023-09-29 DIAGNOSIS — G894 Chronic pain syndrome: Secondary | ICD-10-CM | POA: Diagnosis not present

## 2023-09-29 DIAGNOSIS — Z978 Presence of other specified devices: Secondary | ICD-10-CM | POA: Diagnosis not present

## 2023-09-29 DIAGNOSIS — M5030 Other cervical disc degeneration, unspecified cervical region: Secondary | ICD-10-CM | POA: Diagnosis not present

## 2023-10-07 ENCOUNTER — Other Ambulatory Visit: Payer: Self-pay | Admitting: Family Medicine

## 2023-10-08 NOTE — Telephone Encounter (Signed)
Pt LOV was on 08/23/23 Last refill done on 04/01/23 Please advise

## 2023-10-11 DIAGNOSIS — K219 Gastro-esophageal reflux disease without esophagitis: Secondary | ICD-10-CM | POA: Diagnosis not present

## 2023-10-11 DIAGNOSIS — J329 Chronic sinusitis, unspecified: Secondary | ICD-10-CM | POA: Diagnosis not present

## 2023-11-19 ENCOUNTER — Ambulatory Visit: Payer: Self-pay | Admitting: Family Medicine

## 2023-11-19 NOTE — Telephone Encounter (Signed)
Copied from CRM [id number]. Topic: Clinical - Red Word Triage >> Nov 19, 2023 11:08 AM Corin V wrote: Red Word that prompted transfer to Nurse Triage: Patient has Hx of shingles and has sudden bloating/swelling of stomach. He states stomach is distended and it is causing significant pain (at 8/10). Eating or drinking anything will push pain up to a 10 and he almost went to ER last night after eating a few chips.   Chief Complaint: Abdominal pain/bloating Symptoms: Abdominal bloating, abdominal pain, nausea  Frequency: Constant  Pertinent Negatives: Patient denies vomiting Disposition: [x] ED /[] Urgent Care (no appt availability in office) / [] Appointment(In office/virtual)/ []  [facility]/ [] Home Care/ [] Refused Recommended Disposition /[] [place] [facility]/ []  Follow-up with PCP Additional Notes: Patient reports abdominal pain and bloating that began 3 days ago. He reports his pain is worse with food or drink. He states he is also experiencing nausea but has not had any vomiting. Patient recently diagnosed with shingles and is unsure if the shingles could be causing his pain. Patient advised that due to the severity of his pain and the aggravating factors that he should go to the ED for evaluation. Patient understood and is agreeable with this plan.    Reason for Disposition  [1] SEVERE pain (e.g., excruciating) AND [2] present > 1 hour  Answer Assessment - Initial Assessment Questions 1. LOCATION: "Where does it hurt?"      Diffuse  2. RADIATION: "Does the pain shoot anywhere else?" (e.g., chest, back)     No radiation  3. ONSET: "When did the pain begin?" (Minutes, hours or days ago)      2 days ago 4. SUDDEN: "Gradual or sudden onset?"     Sudden 5. PATTERN "Does the pain come and go, or is it constant?"    - If it comes and goes: "How long does it last?" "Do you have pain now?"     (Note: Comes and goes means the pain is intermittent. It goes away completely between  bouts.)    - If constant: "Is it getting better, staying the same, or getting worse?"      (Note: Constant means the pain never goes away completely; most serious pain is constant and gets worse.)      Constant  6. SEVERITY: "How bad is the pain?"  (e.g., Scale 1-10; mild, moderate, or severe)    - MILD (1-3): Doesn't interfere with normal activities, abdomen soft and not tender to touch.     - MODERATE (4-7): Interferes with normal activities or awakens from sleep, abdomen tender to touch.     - SEVERE (8-10): Excruciating pain, doubled over, unable to do any normal activities.       8/10 7. RECURRENT SYMPTOM: "Have you ever had this type of stomach pain before?" If Yes, ask: "When was the last time?" and "What happened that time?"      No 8. CAUSE: "What do you think is causing the stomach pain?"     Unsure 9. RELIEVING/AGGRAVATING FACTORS: "What makes it better or worse?" (e.g., antacids, bending or twisting motion, bowel movement)     Eating/drinking makes it worse 10. OTHER SYMPTOMS: "Do you have any other symptoms?" (e.g., back pain, diarrhea, fever, urination pain, vomiting)       Nausea  Protocols used: Abdominal Pain - Male-A-AH

## 2023-11-23 ENCOUNTER — Other Ambulatory Visit: Payer: Self-pay | Admitting: Family Medicine

## 2023-11-25 ENCOUNTER — Other Ambulatory Visit: Payer: Self-pay | Admitting: Family Medicine

## 2023-11-25 NOTE — Telephone Encounter (Signed)
 Copied from CRM [id number]. Topic: Clinical - Medication Refill >> Nov 25, 2023  4:16 PM Evie B wrote: Most Recent Primary Care Visit:  Provider: JOHNNY SENIOR A  Department: [facility]  Visit Type: OFFICE VISIT  Date: 08/23/2023  Medication: amphetamine -dextroamphetamine  (ADDERALL) 20 MG tablet  Has the patient contacted their pharmacy? No (Agent: If no, request that the patient contact the pharmacy for the refill. If patient does not wish to contact the pharmacy document the reason why and proceed with request.) (Agent: If yes, when and what did the pharmacy advise?)  Is this the correct pharmacy for this prescription? Yes If no, delete pharmacy and type the correct one.  This is the patient's preferred pharmacy:    [facility] [address] [place], [place] - [address] AT [facility] [address] [place] [address] Phone: [phone] Fax: [phone]   Has the prescription been filled recently? Yes  Is the patient out of the medication? No  Has the patient been seen for an appointment in the last year OR does the patient have an upcoming appointment? Yes  Can we respond through MyChart? Yes  Agent: Please be advised that Rx refills may take up to 3 business days. We ask that you follow-up with your pharmacy.

## 2023-11-29 ENCOUNTER — Encounter: Payer: Self-pay | Admitting: Family Medicine

## 2023-11-30 ENCOUNTER — Telehealth: Payer: Self-pay

## 2023-11-30 NOTE — Telephone Encounter (Signed)
 Copied from CRM [id number]. Topic: Clinical - Prescription Issue >> Nov 30, 2023  3:42 PM Corin V wrote: Reason for CRM: Patient called in stating he is trying to find out why his adderall Rx was denied by Dr. Johnny. There is no note in chart indicating denial reason. Please call patient back with update on reason for denial and plan moving forward if the Rx will not be continued to be filled.

## 2023-12-01 ENCOUNTER — Other Ambulatory Visit: Payer: Self-pay | Admitting: Family Medicine

## 2023-12-01 MED ORDER — AMPHETAMINE-DEXTROAMPHETAMINE 20 MG PO TABS: 20.0000 mg | ORAL_TABLET | Freq: Three times a day (TID) | ORAL | 0 refills | Status: DC

## 2023-12-01 NOTE — Telephone Encounter (Signed)
 Pt is calling checking on the below message. I told agent it may need PA

## 2023-12-01 NOTE — Telephone Encounter (Signed)
 I sent in the refills

## 2023-12-02 ENCOUNTER — Telehealth: Payer: Self-pay | Admitting: *Deleted

## 2023-12-02 ENCOUNTER — Other Ambulatory Visit: Payer: Self-pay

## 2023-12-02 MED ORDER — AMPHETAMINE-DEXTROAMPHETAMINE 20 MG PO TABS: 20.0000 mg | ORAL_TABLET | Freq: Three times a day (TID) | ORAL | 0 refills | Status: DC

## 2023-12-02 NOTE — Telephone Encounter (Signed)
 Done

## 2023-12-02 NOTE — Telephone Encounter (Signed)
 Copied from CRM [id number]. Topic: Clinical - Prescription Issue >> Dec 02, 2023  8:02 AM [name] wrote: Reason for CRM:  Please send morphine (MS CONTIN) 30 MG 12 hr tablet Oxycodone  HCl 10 MG TABS to [id number] [place], [place] - [address] AT [facility] [address] [place] [address] Phone: [phone] Fax: [phone]- Patient states Dr. Johnny filled this medication for him yesterday but needs it sent to this pharmacy as they actually have it in stock.

## 2023-12-02 NOTE — Addendum Note (Signed)
 Addended by: Gershon Crane A on: 12/02/2023 12:52 PM   Modules accepted: Orders

## 2023-12-02 NOTE — Telephone Encounter (Signed)
 Spoke with pt stated that his Adderall was sent to wrong pharmacy, Please sent Rx to  [facility] in [facility]

## 2023-12-03 ENCOUNTER — Telehealth: Payer: Self-pay | Admitting: Gastroenterology

## 2023-12-03 NOTE — Telephone Encounter (Signed)
 Patient calls stating that he feels it is time for him to follow through with TIF procedure previously recommended to him by Dr San in 2023. States he realizes he would need to get an echo, get cardiology clearance ect.   Patient was previously scheduled for follow up with Greig Corti, PA-C on 12/08/23 but wanted to see what he could do in the meantime to get relief of the smell of acid in my sinuses and food particles coming back up into his CPAP machine.   We discussed antireflux measures/diet and he states that he is still taking omeprazole  as previously prescribed. Advised that he continue this and discuss further at his upcoming office visit on 12/08/23.

## 2023-12-03 NOTE — Telephone Encounter (Signed)
 This has already been done, please close pt chart

## 2023-12-03 NOTE — Telephone Encounter (Signed)
 Patient called stated he is feeling really bad with an acid like smell in his sinuses or Gerd symptoms per patient. Please advise.

## 2023-12-08 ENCOUNTER — Ambulatory Visit: Payer: BC Managed Care – PPO | Admitting: Physician Assistant

## 2023-12-08 VITALS — BP 126/66 | HR 62 | Ht 66.0 in | Wt 180.0 lb

## 2023-12-08 DIAGNOSIS — K219 Gastro-esophageal reflux disease without esophagitis: Secondary | ICD-10-CM

## 2023-12-08 MED ORDER — OMEPRAZOLE 40 MG PO CPDR: 40.0000 mg | DELAYED_RELEASE_CAPSULE | Freq: Two times a day (BID) | ORAL | 11 refills | Status: AC

## 2023-12-08 MED ORDER — FAMOTIDINE 40 MG PO TABS: 40.0000 mg | ORAL_TABLET | Freq: Every day | ORAL | 6 refills | Status: DC

## 2023-12-08 NOTE — Patient Instructions (Addendum)
 _______________________________________________________  If your blood pressure at your visit was 140/90 or greater, please contact your primary care physician to follow up on this.  _______________________________________________________  If you are age 54 or older, your body mass index should be between 23-30. Your Body mass index is 29.05 kg/m. If this is out of the aforementioned range listed, please consider follow up with your Primary Care Provider.  If you are age 52 or younger, your body mass index should be between 19-25. Your Body mass index is 29.05 kg/m. If this is out of the aformentioned range listed, please consider follow up with your Primary Care Provider.   ________________________________________________________  The [facility] providers would like to encourage you to use MYCHART to communicate with providers for non-urgent requests or questions.  Due to long hold times on the telephone, sending your provider a message by [facility] may be a faster and more efficient way to get a response.  Please allow 48 business hours for a response.  Please remember that this is for non-urgent requests.  _______________________________________________________  Start antireflux regimen. A handout has been provided.  Omeprazole  and pepcid  has been sent to the pharmacy.  You have been notified about your upcoming pulmonary and cardiology appointment. Please call us  after you have completed these appointments to see how we can proceed   It was a pleasure to see you today!  Thank you for trusting me with your gastrointestinal care!

## 2023-12-09 ENCOUNTER — Ambulatory Visit: Payer: BC Managed Care – PPO | Admitting: Adult Health

## 2023-12-09 ENCOUNTER — Encounter: Payer: Self-pay | Admitting: Adult Health

## 2023-12-09 ENCOUNTER — Encounter: Payer: Self-pay | Admitting: Cardiology

## 2023-12-09 VITALS — BP 118/54 | HR 72 | Temp 98.0°F | Ht 67.0 in | Wt 179.8 lb

## 2023-12-09 DIAGNOSIS — M3500 Sicca syndrome, unspecified: Secondary | ICD-10-CM

## 2023-12-09 DIAGNOSIS — J849 Interstitial pulmonary disease, unspecified: Secondary | ICD-10-CM | POA: Diagnosis not present

## 2023-12-09 DIAGNOSIS — G4733 Obstructive sleep apnea (adult) (pediatric): Secondary | ICD-10-CM | POA: Diagnosis not present

## 2023-12-09 DIAGNOSIS — J4520 Mild intermittent asthma, uncomplicated: Secondary | ICD-10-CM | POA: Diagnosis not present

## 2023-12-09 DIAGNOSIS — K219 Gastro-esophageal reflux disease without esophagitis: Secondary | ICD-10-CM

## 2023-12-09 HISTORY — DX: Interstitial pulmonary disease, unspecified: J84.9

## 2023-12-09 LAB — LAB REPORT - SCANNED: EGFR: 89

## 2023-12-09 NOTE — Patient Instructions (Addendum)
Continue on CPAP At bedtime.  Keep up good work  Set up for HRCT Chest  Follow up with Dr. Marchelle Gearing in [facility] clinic with PFT - slot

## 2023-12-09 NOTE — Assessment & Plan Note (Signed)
History of lupus, Sjogren's and Raynaud's recommend that he follow back up with rheumatology.

## 2023-12-09 NOTE — Assessment & Plan Note (Signed)
Interstitial lung disease in the setting of connective tissue disorder-CT chest 11/2022 showed progressive changes, findings consistent with UIP. Unable to see [facility] rheumatology's most recent office notes.  Long discussion with patient regarding interstitial lung disease and potential for progression.  Need to repeat high-resolution CT chest, order PFTs.  Also recommend that he follow back up with rheumatology.  Have referred him to the interstitial lung disease clinic here at our office with Dr. Marchelle Gearing.  Plan  Patient Instructions  Continue on CPAP At bedtime.  Keep up good work  Set up for HRCT Chest  Follow up with Dr. Marchelle Gearing in [facility] clinic with PFT - slot

## 2023-12-09 NOTE — Assessment & Plan Note (Addendum)
GERD and hiatal hernia.  Currently being evaluated with gastroenterology.  Patient is here for preop clearance.  For now we will need further pulmonary evaluation for interstitial lung disease and management of underlying connective tissue disease Also will need to see recommendations from GI regarding surgery and timing.

## 2023-12-09 NOTE — Assessment & Plan Note (Signed)
Excellent control and compliance on nocturnal CPAP 

## 2023-12-09 NOTE — Assessment & Plan Note (Signed)
History of asthma.  Currently with no significant symptom burden.  No perceived benefit with Breo or albuterol.  Hold on inhalers at this time.  Repeat PFTs on follow-up visit.

## 2023-12-09 NOTE — Progress Notes (Signed)
@Patient  ID: [name], male    DOB: 1971-07-31, 53 y.o.   MRN: [id number]  Chief Complaint  Patient presents with   Follow-up    Referring provider: Nelwyn Salisbury, MD  HPI: 53 year old male never smoker followed for interstitial lung disease in the setting of connective tissue disease, asthma and obstructive sleep apnea Medical history significant for chronic pain, Raynaud's, coronary artery disease, solitary kidney, lupus, Sjogren's, substance abuse  TEST/EVENTS :  PFT 11/29/19 >> FEV1 2.06 (59%), FEV1% 87, TLC 3.55 (57%), DLCO 79%   Serology:  05/2204 >> RF 31, ANCA negative, ANA 1:2560 speckled, anti DS DNA 68, anti Jo negative, anti SM positive, anti Ro/La positive, aldolase 3.1 09/15/19 >> ANCA negative, RF < 14, ANA positive, SM Ab > 8, SSA > 8, SCL 70 negative, SSB negative, dsDNA negative, aldolase 4.5, ESR 28   Chest Imaging:  HRCT chest 12/11/22 >> coronary atherosclerosis, moderate patchy subpleural and peripheral reticulation and mild GGO with moderate traction BTX with basilar predominance, regions of mild/mod honeycombing (consistent with UIP)   Sleep Tests:  HST 12/30/16 >> AHI 28.4, SpO2 low 48% Auto CPAP 11/25/22 to 12/24/22 >> used on 30 of 30 nights with average 6 hrs 50 min.  Average AHI 1 with median CPAP 6 and 95 th percentile CPAP 8 cm H2O   Cardiac Tests:  Echo 10/15/21 >> EF 60 to 65%, mild LVH   12/09/2023 Follow up : ILD, Asthma and OSA , surgical clearance Patient presents for a follow-up visit.  Patient was last seen February 2024.  Patient is followed for interstitial lung disease in the setting of connective tissue disease.  Patient has a history of Sjogren's and lupus.  Has been followed by [facility] rheumatology.  Records were unavailable for today's visit.  Patient says it has been a while since he seen rheumatology.  Is currently not on any immunosuppressant therapy. High-resolution CT chest December 11, 2022 showed basilar predominant fibrotic  interstitial lung disease with mild to moderate bibasilar honeycombing with progressive changes compared to 2020.  Previous PFTs in 2021 showed moderate restriction.  Patient says overall he is doing about the same since his last visit.  He continues to work full-time.  He works at [employer].  He is able to do light yard work.  Does get some shortness of breath with prolonged walking or heavy activity.  He is not on oxygen at home.  Has a intermittent dry cough.  He does not exercise on a regular basis.  Patient has obstructive sleep apnea is on nocturnal CPAP.  Patient says he is doing well on CPAP.  He uses a nasal mask.  Feels that he benefits from CPAP.  CPAP download shows excellent compliance with daily average usage at 7 hours.  Patient is on auto CPAP 5 to 20 cm H2O.  AHI 1.6/hour  Patient has a history of asthma.  Says he has previously been on inhaler such as Breo and albuterol with no perceived benefit.  Has not been taking anything for the last year.  He denies any wheezing.  Patient says he has been dealing with chronic sinus issues and reflux.  He has been seen by GI and told that he may need hiatal hernia surgery.  He is here for preop pulmonary risk assessment.  Patient has a history of lupus, Sjogren's and Raynaud's.   Allergies  Allergen Reactions   Armoracia Rusticana Ext (Horseradish) Anaphylaxis, Hives and Shortness Of Breath   Other Hives    -  Cocktail Sauce    Morphine And Codeine Itching   Trimethoprim Other (See Comments)    Kidney Toxicity     Rocephin [Ceftriaxone Sodium In Dextrose]     When taking over a longer period developed severe body aches, shaking, and headaches    Sulfamethoxazole Rash    Immunization History  Administered Date(s) Administered   Influenza Split 01/13/2012, 10/04/2012, 11/23/2016   Influenza Whole 08/23/2005, 08/24/2008   Influenza,inj,Quad PF,6+ Mos 11/10/2013, 02/07/2015, 08/16/2015, 10/27/2016, 09/15/2019, 10/01/2021    Influenza,trivalent, recombinat, inj, PF 11/23/2016   Influenza-Unspecified 01/22/2012    Past Medical History:  Diagnosis Date   Angina pectoris (HCC) 10/06/2021   Anxiety    Asthma    early adulthood/ exercise induced   Atherosclerosis of aorta (HCC) 03/26/2021   Attention deficit hyperactivity disorder (ADHD) 04/03/2008   Qualifier: Diagnosis of  By: Linna Darner, CMA, Cindy     Avascular bone necrosis (HCC) 02/07/2020   Avascular necrosis (HCC)    of knees from long term steroid use   Backache 07/22/2007   Qualifier: Diagnosis of  By: Linna Darner, CMA, Cindy     Bladder neck obstruction 03/23/2011   Bone pain    chronic   CAD (coronary artery disease) severe LAD disease need for bypass 10/15/2021   Chronic kidney disease    just 1 kidney congenital   Chronic steroid use 11/10/2013   Coffee ground emesis 11/10/2013   Coronary atherosclerosis 10/06/2021   DEPRESSIVE DISORDER, RCR, MODERATE 06/30/2007   Qualifier: Diagnosis of  By: Clent Ridges MD, Tera Mater    DERMATOMYOSITIS 04/27/2007   Qualifier: Diagnosis of  By: Linna Darner, CMA, Cindy     Diarrhea 11/10/2013   End of battery life of intrathecal infusion pump 09/07/2022   Esophageal ulcer    ETHMOID SINUSITIS 04/03/2008   Qualifier: Diagnosis of  By: Linna Darner, CMA, Cindy     Extremity pain 04/04/2013   Generalized anxiety disorder 04/03/2008   Qualifier: Diagnosis of  By: Linna Darner, CMA, Cindy     GERD 04/03/2008   Qualifier: Diagnosis of  By: Linna Darner, CMA, Cindy     GI bleed 11/09/2013   H/O long-term treatment with high-risk medication 02/22/2012   HEADACHE 10/01/2010   Qualifier: Diagnosis of  By: Clent Ridges MD, Stephen A    Hematemesis 11/10/2013   Hiatal hernia    History of pulmonary embolism 10/18/2018   HLD (hyperlipidemia) 10/15/2021   HYPERHIDROSIS 04/27/2007   Qualifier: Diagnosis of  By: Linna Darner, CMA, Cindy     Hypogonadism, male 03/26/2009   Qualifier: Diagnosis of  By: Everardo All MD, Sean A    Impotence of organic origin 03/01/2012    Insomnia    INTERSTITIAL LUNG DISEASE 04/27/2007   Qualifier: Diagnosis of  By: Linna Darner, CMA, Cindy     LEUKOPENIA, CHRONIC 04/27/2007   Qualifier: Diagnosis of  By: Linna Darner, CMA, Cindy     Low back pain    LUPUS 04/27/2007   Qualifier: Diagnosis of  By: Linna Darner, CMA, Cindy     N&V (nausea and vomiting) 11/10/2013   Neck pain 12/19/2012   OSA on CPAP 10/27/2016   Pain 02/25/2016   Pain syndrome, chronic 12/05/2012   PERICARDITIS 04/27/2007   Qualifier: Diagnosis of  By: Linna Darner, CMA, Cindy     PITUITARY INSUFFICIENCY 03/26/2009   Qualifier: Diagnosis of  By: Everardo All MD, Sean A    PLEURISY 04/27/2007   Qualifier: Diagnosis of  By: Linna Darner, CMA, Cindy     Presence of intrathecal pump 01/30/2020   Formatting of this note  might be different from the original. Added automatically from request for surgery [id number]   Pulmonary embolism (HCC) 09/03/2011   Formatting of this note might be different from the original. somewhere between 2003-07 - took blood thinners for several months, then stopped   PULMONARY EMBOLISM, HX OF 04/27/2007   Qualifier: Diagnosis of  By: Linna Darner, CMA, Cindy     Pulmonary fibrosis (HCC)    RAYNAUD'S DISEASE 05/28/2009   Qualifier: Diagnosis of  By: Alphonzo Severance MD, Loni Dolly    S/P CABG x 1 10/19/2021   Formatting of this note might be different from the original. Buchanan-LAD 10/18/21   Sjogren's syndrome (HCC)    Solitary kidney, congenital 02/07/2020   Substance abuse (HCC) 12/06/2019   Seen 12/01/2019 at [facility] for hallucinations after ingestion of  meth taken 11/28/2019.   Unstable angina (HCC) 10/14/2021    Tobacco History: Social History   Tobacco Use  Smoking Status Never  Smokeless Tobacco Never   Counseling given: Not Answered   Outpatient Medications Prior to Visit  Medication Sig Dispense Refill   ALPRAZolam (XANAX) 1 MG tablet TAKE 1 TABLET BY MOUTH 3 TIMES A DAY AS NEEDED FOR ANXIETY 90 tablet 5   amphetamine-dextroamphetamine (ADDERALL) 20 MG tablet  Take 1 tablet (20 mg total) by mouth 3 (three) times daily. 90 tablet 0   aspirin EC 81 MG EC tablet Take 1 tablet (81 mg total) by mouth daily. Swallow whole. 30 tablet 11   famotidine (PEPCID) 40 MG tablet Take 1 tablet (40 mg total) by mouth daily. In the evening 30 tablet 6   fluconazole (DIFLUCAN) 150 MG tablet Take 1 tablet (150 mg total) by mouth as needed. 30 tablet 5   HYDROmorphone HCl (DILAUDID PO) Take by mouth. PUMP     omeprazole (PRILOSEC) 40 MG capsule Take 1 capsule (40 mg total) by mouth 2 (two) times daily. 60 capsule 0   omeprazole (PRILOSEC) 40 MG capsule Take 1 capsule (40 mg total) by mouth 2 (two) times daily. 60 capsule 11   ondansetron (ZOFRAN) 4 MG tablet Take 1 tablet (4 mg total) by mouth every 8 (eight) hours as needed for nausea or vomiting. 60 tablet 5   tadalafil (CIALIS) 20 MG tablet Take 1 tablet (20 mg total) by mouth daily as needed. 10 tablet 0   testosterone enanthate (DELATESTRYL) 200 MG/ML injection SMARTSIG:0.5 Milliliter(s) IM Once a Week     No facility-administered medications prior to visit.     Review of Systems:   Constitutional:   No  weight loss, night sweats,  Fevers, chills, fatigue, or  lassitude.  HEENT:   No headaches,  Difficulty swallowing,  Tooth/dental problems, or  Sore throat,                No sneezing, itching, ear ache, +nasal congestion, post nasal drip,   CV:  No chest pain,  Orthopnea, PND, swelling in lower extremities, anasarca, dizziness, palpitations, syncope.   GI  No change in bowel habits, loss of appetite, bloody stools.   Resp:   No chest wall deformity  Skin: no rash or lesions.  GU: no dysuria, change in color of urine, no urgency or frequency.  No flank pain, no hematuria   MS:  No joint pain or swelling.  No decreased range of motion.  No back pain.    Physical Exam  BP (!) 118/54 (BP Location: Left Arm, Patient Position: Sitting, Cuff Size: Normal)   Pulse 72   Temp 98  F (36.7 C) (Oral)   Ht 5'  7" (1.702 m)   Wt 179 lb 12.8 oz (81.6 kg)   SpO2 98%   BMI 28.16 kg/m   GEN: A/Ox3; pleasant , NAD, well nourished    HEENT:  [place]/AT,  EACs-clear, TMs-wnl, NOSE-clear, THROAT-clear, no lesions, no postnasal drip or exudate noted.   NECK:  Supple w/ fair ROM; no JVD; normal carotid impulses w/o bruits; no thyromegaly or nodules palpated; no lymphadenopathy.    RESP  BB crackles, no accessory muscle use, no dullness to percussion  CARD:  RRR, no m/r/g, no peripheral edema, pulses intact, no cyanosis or clubbing.  GI:   Soft & nt; nml bowel sounds; no organomegaly or masses detected.   Musco: Warm bil, no deformities or joint swelling noted.   Neuro: alert, no focal deficits noted.    Skin: Warm, no lesions or rashes    Lab Results:    BNP No results found for: "BNP"  ProBNP No results found for: "PROBNP"  Imaging: No results found.  Administration History     None          Latest Ref Rng & Units 11/29/2019   12:07 PM  PFT Results  FVC-Pre L 2.37   FVC-Predicted Pre % 53   FVC-Post L 2.36   FVC-Predicted Post % 53   Pre FEV1/FVC % % 82   Post FEV1/FCV % % 87   FEV1-Pre L 1.95   FEV1-Predicted Pre % 56   FEV1-Post L 2.06   DLCO uncorrected ml/min/mmHg 20.77   DLCO UNC% % 79   DLVA Predicted % 135   TLC L 3.55   TLC % Predicted % 57   RV % Predicted % 57     No results found for: "NITRICOXIDE"      Assessment & Plan:   ILD (interstitial lung disease) (HCC) Interstitial lung disease in the setting of connective tissue disorder-CT chest 11/2022 showed progressive changes, findings consistent with UIP. Unable to see [facility] rheumatology's most recent office notes.  Long discussion with patient regarding interstitial lung disease and potential for progression.  Need to repeat high-resolution CT chest, order PFTs.  Also recommend that he follow back up with rheumatology.  Have referred him to the interstitial lung disease clinic here at our office with Dr.  Marchelle Gearing.  Plan  Patient Instructions  Continue on CPAP At bedtime.  Keep up good work  Set up for HRCT Chest  Follow up with Dr. Marchelle Gearing in ILD clinic with PFT - slot     Asthma History of asthma.  Currently with no significant symptom burden.  No perceived benefit with Breo or albuterol.  Hold on inhalers at this time.  Repeat PFTs on follow-up visit.  Sjogren's syndrome (HCC) History of lupus, Sjogren's and Raynaud's recommend that he follow back up with rheumatology.  OSA on CPAP Excellent control and compliance on nocturnal CPAP.  GERD GERD and hiatal hernia.  Currently being evaluated with gastroenterology.  Patient is here for preop clearance.  For now we will need further pulmonary evaluation for interstitial lung disease and management of underlying connective tissue disease Also will need to see recommendations from GI regarding surgery and timing.      Rubye Oaks, NP 12/09/2023

## 2023-12-12 ENCOUNTER — Encounter: Payer: Self-pay | Admitting: Physician Assistant

## 2023-12-12 NOTE — Progress Notes (Signed)
Subjective:    Patient ID: [name], male    DOB: 24-Jun-1971, 53 y.o.   MRN: [id number]  HPI [name] is a 53 year old male, established with Dr. Barron Alvine who comes in today for follow-up, and to restart evaluation for TIF. Patient was last seen by Dr. Barron Alvine in November 2023, with history of chronic GERD, requiring long-term twice daily PPI with inadequate control of symptoms.   He has history of SLE, Sjogren's, interstitial lung disease, sleep apnea with CPAP use, ADHD, prior history of PE, coronary artery disease status post CABG 2022, bilateral avascular necrosis with chronic pain syndrome, on chronic pain management currently with a Dilaudid pump and followed by pain management.  He also has prior history of substance abuse. He had previously been evaluated for TIF in 2020, and procedure was scheduled but was canceled as he had a hospital admission for substance abuse at that time. Previous workup had included EGD, barium swallow, esophageal manometry. Esophageal manometry in 2020 with incomplete bolus clearance with 7 out of 10 swallows, normal peristalsis on 10 out of 10 swallows 1.6 cm hiatal hernia .  After office visit in 2023 he was scheduled for repeat EGD which was done 10/08/2022 with finding of a normal-appearing esophagus, 1 cm sliding hiatal hernia, gastroesophageal flap valve visualized and classified as Hill grade 2, stomach and duodenum normal. Patient was advised to follow-up with cardiology and pulmonary for clearance and then return to the GI clinic to be scheduled. He had 3 no-shows here in 2024, comes in today as he is still interested in pursuing surgical options for his chronic poorly controlled GERD. He says he has symptoms "all the time", but states that symptoms are "terrible" if he misses a dose of medication.  He endorses a lot of nocturnal reflux symptoms and an intermittent sensation of an [place]it[place] feeling up into his throat and into his sinuses.   He tries to use a CPAP at nighttime but sometimes this is difficult due to refluxing.  He also has daytime symptoms depending on his diet, says he is unable to drink sodas at all, and is miserable if he misses any medication doses.  No dysphagia or odynophagia.  Review of Systems Pertinent positive and negative review of systems were noted in the above HPI section.  All other review of systems was otherwise negative.   Outpatient Encounter Medications as of 12/08/2023  Medication Sig   ALPRAZolam (XANAX) 1 MG tablet TAKE 1 TABLET BY MOUTH 3 TIMES A DAY AS NEEDED FOR ANXIETY   amphetamine-dextroamphetamine (ADDERALL) 20 MG tablet Take 1 tablet (20 mg total) by mouth 3 (three) times daily.   aspirin EC 81 MG EC tablet Take 1 tablet (81 mg total) by mouth daily. Swallow whole.   famotidine (PEPCID) 40 MG tablet Take 1 tablet (40 mg total) by mouth daily. In the evening   fluconazole (DIFLUCAN) 150 MG tablet Take 1 tablet (150 mg total) by mouth as needed.   HYDROmorphone HCl (DILAUDID PO) Take by mouth. PUMP   omeprazole (PRILOSEC) 40 MG capsule Take 1 capsule (40 mg total) by mouth 2 (two) times daily.   omeprazole (PRILOSEC) 40 MG capsule Take 1 capsule (40 mg total) by mouth 2 (two) times daily.   ondansetron (ZOFRAN) 4 MG tablet Take 1 tablet (4 mg total) by mouth every 8 (eight) hours as needed for nausea or vomiting.   tadalafil (CIALIS) 20 MG tablet Take 1 tablet (20 mg total) by mouth daily as needed.  testosterone enanthate (DELATESTRYL) 200 MG/ML injection SMARTSIG:0.5 Milliliter(s) IM Once a Week   [DISCONTINUED] acetaminophen (TYLENOL) 325 MG tablet Take 2 tablets (650 mg total) by mouth every 4 (four) hours as needed for headache or mild pain.   [DISCONTINUED] amphetamine-dextroamphetamine (ADDERALL) 20 MG tablet Take 1 tablet (20 mg total) by mouth 3 (three) times daily.   [DISCONTINUED] amphetamine-dextroamphetamine (ADDERALL) 20 MG tablet Take 1 tablet (20 mg total) by mouth 3 (three)  times daily.   [DISCONTINUED] fluticasone furoate-vilanterol (BREO ELLIPTA) 100-25 MCG/ACT AEPB Inhale 1 puff into the lungs daily.   No facility-administered encounter medications on file as of 12/08/2023.   Allergies  Allergen Reactions   Armoracia Rusticana Ext (Horseradish) Anaphylaxis, Hives and Shortness Of Breath   Other Hives    -Cocktail Sauce    Morphine And Codeine Itching   Trimethoprim Other (See Comments)    Kidney Toxicity     Rocephin [Ceftriaxone Sodium In Dextrose]     When taking over a longer period developed severe body aches, shaking, and headaches    Sulfamethoxazole Rash   Patient Active Problem List   Diagnosis Date Noted   ILD (interstitial lung disease) (HCC) 12/09/2023   Varicose veins of both lower extremities 08/23/2023   End of battery life of intrathecal infusion pump 09/07/2022   S/P CABG x 1 10/19/2021   CAD (coronary artery disease) severe LAD disease need for bypass 10/15/2021   HLD (hyperlipidemia) 10/15/2021   Unstable angina (HCC) 10/14/2021   Coronary atherosclerosis 10/06/2021   Angina pectoris (HCC) 10/06/2021   Anxiety 05/19/2021   Asthma 05/19/2021   Avascular necrosis (HCC) 05/19/2021   Bone pain 05/19/2021   Chronic kidney disease 05/19/2021   Esophageal ulcer 05/19/2021   Insomnia 05/19/2021   Low back pain 05/19/2021   Pulmonary fibrosis (HCC) 05/19/2021   Sjogren's syndrome (HCC) 05/19/2021   Atherosclerosis of aorta (HCC) 03/26/2021   Avascular bone necrosis (HCC) 02/07/2020   Solitary kidney, congenital 02/07/2020   Presence of intrathecal pump 01/30/2020   Substance abuse (HCC) 12/06/2019   Hiatal hernia    History of pulmonary embolism 10/18/2018   OSA on CPAP 10/27/2016   Pain 02/25/2016   N&V (nausea and vomiting) 11/10/2013   Coffee ground emesis 11/10/2013   Diarrhea 11/10/2013   Chronic steroid use 11/10/2013   Hematemesis 11/10/2013   GI bleed 11/09/2013   Extremity pain 04/04/2013   Neck pain  12/19/2012   Pain syndrome, chronic 12/05/2012   Impotence of organic origin 03/01/2012   H/O long-term treatment with high-risk medication 02/22/2012   Pulmonary embolism (HCC) 09/03/2011   Bladder neck obstruction 03/23/2011   Headache 10/01/2010   RAYNAUD'S DISEASE 05/28/2009   PITUITARY INSUFFICIENCY 03/26/2009   Hypogonadism, male 03/26/2009   Generalized anxiety disorder 04/03/2008   Attention deficit hyperactivity disorder (ADHD) 04/03/2008   ETHMOID SINUSITIS 04/03/2008   GERD 04/03/2008   Backache 07/22/2007   DEPRESSIVE DISORDER, RCR, MODERATE 06/30/2007   Leukocytopenia 04/27/2007   Disease of pericardium 04/27/2007   Pleurisy 04/27/2007   Postinflammatory pulmonary fibrosis (HCC) 04/27/2007   Systemic lupus erythematosus (HCC) 04/27/2007   DERMATOMYOSITIS 04/27/2007   HYPERHIDROSIS 04/27/2007   PULMONARY EMBOLISM, HX OF 04/27/2007   Social History   Socioeconomic History   Marital status: Married    Spouse name: Not on file   Number of children: 3   Years of education: Not on file   Highest education level: Not on file  Occupational History   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  Tobacco Use   Smoking status: Never   Smokeless tobacco: Never  Vaping Use   Vaping status: Never Used  Substance and Sexual Activity   Alcohol use: Yes    Alcohol/week: 0.0 standard drinks of alcohol    Comment: rare   Drug use: Not Currently    Types: Methamphetamines   Sexual activity: Not on file  Other Topics Concern   Not on file  Social History Narrative   On disablitity   Social Drivers of Health   Financial Resource Strain: Not on file  Food Insecurity: Not on file  Transportation Needs: No Transportation Needs (10/17/2021)   Received from [facility]   PRAPARE - Transportation  Physical Activity: Not on file  Stress: Stress Concern Present (01/11/2023)   Received from [facility], [facility]  Occupational Health - Occupational Stress Questionnaire    Feeling of Stress : To some extent  Social Connections: Unknown (04/04/2022)   Received from [facility], [facility]   Social Network    Social Network: Not on file  Intimate Partner Violence: Unknown (02/24/2022)   Received from [facility], [facility]   HITS    Physically Hurt: Not on file    Insult or Talk Down To: Not on file    Threaten Physical Harm: Not on file    Scream or Curse: Not on file    Mr. Rumore's family history includes Anemia in an other family member; Arthritis in an other family member; Asthma in his mother; Crohn's disease in an other family member; Fibromyalgia in his mother.      Objective:    Vitals:   12/08/23 1108  BP: 126/66  Pulse: 62    Physical Exam  Well-developed well-nourished older white male in no acute distress.  Height, Weight, 179  BMI 28.1  HEENT; nontraumatic normocephalic, EOMI, PE R LA, sclera anicteric. Oropharynx; not examined today Neck; supple, no JVD Cardiovascular; regular rate and rhythm with S1-S2, no murmur rub or gallop Pulmonary; Clear bilaterally Abdomen; soft, nontender, nondistended, no palpable mass or hepatosplenomegaly, bowel sounds are active-Dilaudid pump present in the right upper abdomen Rectal; not done today Skin; benign exam, no jaundice rash or appreciable lesions Extremities; no clubbing cyanosis or edema skin warm and dry Neuro/Psych; alert and oriented x4, grossly nonfocal mood and affect appropriate        Assessment & Plan:   #43 53 year old white male with chronic poorly controlled GERD despite twice daily omeprazole 40 mg. Patient had undergone prior evaluation for TIF in 2020 and was scheduled for procedure, which was canceled due to a hospital admission. He was reevaluated in 2023 and underwent repeat EGD.  He was to follow-up after he obtained cardiology and pulmonary clearance, but did not do so and was not seen at all in  2024. He is interested in pursuing TIF this year, and comes in today to discuss and plan for TIF.  #2 SLE, and Sjogren syndrome #3.  Interstitial lung disease #4.  Sleep apnea with CPAP use #5.  Coronary artery disease status post CABG November 2022 #6.  Prior history of PE #7.  Bilateral avascular necrosis of the hips #8.  Chronic pain syndrome with chronic opioid use-currently has a hydromorphone pump and is followed by pain management #9.  Prior history of substance abuse  Plan; refill omeprazole 40 mg p.o. twice daily AC breakfast and AC dinner, patient encouraged to follow a strict antireflux regimen including n.p.o. for  3 hours prior to bedtime and elevation of the back 45 degrees for sleep. We have initiated preoperative clearance appointments with cardiology and pulmonary. He will have follow-up office visit with Dr. Barron Alvine in 2 months and at that time if cleared from cardiac and pulmonary standpoint can work towards getting him scheduled for surgery which may also require laparoscopic hiatal hernia repair.   Idonna Heeren Oswald Hillock PA-C 12/12/2023   Cc: Nelwyn Salisbury, MD

## 2023-12-16 ENCOUNTER — Telehealth: Payer: Self-pay

## 2023-12-16 NOTE — Telephone Encounter (Signed)
Spoke with the patient. Scheduled his follow up appointment with Dr Barron Alvine 02/04/24 at 9:40 am arriving at 9:30 am to the 2nd floor. Patient accepts the appointment. He also understands the appointment on 12/31/23 has been canceled.

## 2023-12-16 NOTE — Progress Notes (Signed)
Agree with the assessment and plan as outlined by Mike Gip, PA-C.  Patient well-known to me from previous workup for potential for refractory reflux.  His evaluation/treatment has been hampered by frequent no-shows and compliance issues.  If he would like to again discuss the possibility of TIF, would need to f/u with me in the office. However, I do have reservations given his clinical history and history of non-compliance with follow-up, which can impact surgical success. Will continue medical management as outlined, and will need Pulmonary and Cardiac clearance to proceed (if we ultimately do offer this surgery to him). If not an appropriate candidate, will refer for lap fundoplication and hernia repair instead.   Rachal Dvorsky, DO, [facility]

## 2023-12-22 ENCOUNTER — Ambulatory Visit: Payer: BC Managed Care – PPO

## 2023-12-22 DIAGNOSIS — J849 Interstitial pulmonary disease, unspecified: Secondary | ICD-10-CM

## 2023-12-27 ENCOUNTER — Other Ambulatory Visit (HOSPITAL_COMMUNITY): Payer: Self-pay

## 2023-12-27 ENCOUNTER — Telehealth: Payer: Self-pay | Admitting: Pharmacy Technician

## 2023-12-27 NOTE — Telephone Encounter (Signed)
Pharmacy Patient Advocate Encounter   Received notification from CoverMyMeds that prior authorization for Amphetamine-Dextroamphetamine 20MG  tablets is required/requested.   Insurance verification completed.   The patient is insured through  [employer]  .   Per test claim: PA required; PA submitted to above mentioned insurance via CoverMyMeds Key/confirmation #/EOC [id number] Status is pending

## 2023-12-27 NOTE — Telephone Encounter (Signed)
Pharmacy Patient Advocate Encounter  Received notification from  [employer]  that Prior Authorization for Amphetamine-dextroamp 20mg  has been DENIED.  See denial reason below. No denial letter attached in CMM. Will attach denial letter to Media tab once received.   PA #/Case ID/Reference #: [id number]

## 2023-12-28 ENCOUNTER — Telehealth: Payer: Self-pay

## 2023-12-28 NOTE — Telephone Encounter (Signed)
 Copied from CRM [id number]. Topic: Clinical - Prescription Issue >> Dec 28, 2023  1:10 PM Pinkey ORN wrote: Reason for CRM: Dextroamp 20 MG Tablet >> Dec 28, 2023  1:15 PM Pinkey ORN wrote: Prior Authorization DENIED  [name] [facility]  Reference # [id number] Call back # [phone] Option 2

## 2023-12-30 NOTE — Telephone Encounter (Signed)
 PA for Adderall has been denied. Please advise

## 2023-12-30 NOTE — Telephone Encounter (Signed)
 FYI Spoke with pt states that he paid Rx out of pocket. Advised to contact insurance for details of denial

## 2023-12-30 NOTE — Telephone Encounter (Signed)
 Ask him if he wants to pay cash for this (he can use [facility])

## 2023-12-31 ENCOUNTER — Ambulatory Visit: Payer: Medicaid Other | Admitting: Gastroenterology

## 2024-01-07 ENCOUNTER — Other Ambulatory Visit (HOSPITAL_BASED_OUTPATIENT_CLINIC_OR_DEPARTMENT_OTHER): Payer: Self-pay

## 2024-01-09 ENCOUNTER — Other Ambulatory Visit: Payer: Self-pay | Admitting: Family Medicine

## 2024-01-10 ENCOUNTER — Encounter: Payer: Self-pay | Admitting: Cardiology

## 2024-01-10 ENCOUNTER — Ambulatory Visit: Payer: BC Managed Care – PPO | Admitting: Internal Medicine

## 2024-01-10 DIAGNOSIS — J849 Interstitial pulmonary disease, unspecified: Secondary | ICD-10-CM

## 2024-01-10 LAB — PULMONARY FUNCTION TEST
DL/VA % pred: 112 %
DL/VA: 5 ml/min/mmHg/L
DLCO cor % pred: 66 %
DLCO cor: 16.88 ml/min/mmHg
DLCO unc % pred: 64 %
DLCO unc: 16.38 ml/min/mmHg
FEF 25-75 Post: 2.22 L/s
FEF 25-75 Pre: 1.76 L/s
FEF2575-%Change-Post: 26 %
FEF2575-%Pred-Post: 75 %
FEF2575-%Pred-Pre: 59 %
FEV1-%Change-Post: 4 %
FEV1-%Pred-Post: 62 %
FEV1-%Pred-Pre: 60 %
FEV1-Post: 2.1 L
FEV1-Pre: 2.01 L
FEV1FVC-%Change-Post: 4 %
FEV1FVC-%Pred-Pre: 103 %
FEV6-%Change-Post: 0 %
FEV6-%Pred-Post: 60 %
FEV6-%Pred-Pre: 60 %
FEV6-Post: 2.52 L
FEV6-Pre: 2.52 L
FEV6FVC-%Pred-Post: 104 %
FEV6FVC-%Pred-Pre: 104 %
FVC-%Change-Post: 0 %
FVC-%Pred-Post: 58 %
FVC-%Pred-Pre: 58 %
FVC-Post: 2.52 L
FVC-Pre: 2.52 L
Post FEV1/FVC ratio: 83 %
Post FEV6/FVC ratio: 100 %
Pre FEV1/FVC ratio: 80 %
Pre FEV6/FVC Ratio: 100 %
RV % pred: 12 %
RV: 0.24 L
TLC % pred: 49 %
TLC: 3.05 L

## 2024-01-10 NOTE — Patient Instructions (Signed)
 Full PFT performed today.

## 2024-01-10 NOTE — Progress Notes (Signed)
 Full PFT performed today.

## 2024-01-11 ENCOUNTER — Telehealth: Payer: Self-pay

## 2024-01-11 NOTE — Telephone Encounter (Signed)
Copied from CRM [id number]. Topic: General - Other >> Jan 11, 2024  4:26 PM Truddie Crumble wrote: Reason for CRM: patient called stating he is checking on his medication refill request that he sent through [facility]

## 2024-01-12 ENCOUNTER — Ambulatory Visit: Payer: BC Managed Care – PPO | Attending: Cardiology | Admitting: Cardiology

## 2024-01-12 ENCOUNTER — Encounter: Payer: Self-pay | Admitting: Cardiology

## 2024-01-12 VITALS — BP 114/74 | HR 72 | Ht 66.0 in | Wt 174.8 lb

## 2024-01-12 DIAGNOSIS — E782 Mixed hyperlipidemia: Secondary | ICD-10-CM

## 2024-01-12 DIAGNOSIS — R079 Chest pain, unspecified: Secondary | ICD-10-CM | POA: Diagnosis not present

## 2024-01-12 DIAGNOSIS — Z789 Other specified health status: Secondary | ICD-10-CM | POA: Insufficient documentation

## 2024-01-12 DIAGNOSIS — Z951 Presence of aortocoronary bypass graft: Secondary | ICD-10-CM

## 2024-01-12 DIAGNOSIS — I2510 Atherosclerotic heart disease of native coronary artery without angina pectoris: Secondary | ICD-10-CM

## 2024-01-12 DIAGNOSIS — Z01810 Encounter for preprocedural cardiovascular examination: Secondary | ICD-10-CM

## 2024-01-12 HISTORY — DX: Other specified health status: Z78.9

## 2024-01-12 HISTORY — DX: Mixed hyperlipidemia: E78.2

## 2024-01-12 MED ORDER — NITROGLYCERIN 0.4 MG SL SUBL: 0.4000 mg | SUBLINGUAL_TABLET | SUBLINGUAL | 6 refills | Status: AC | PRN

## 2024-01-12 NOTE — Patient Instructions (Addendum)
Medication Instructions:   Use nitroglycerin 1 tablet placed under the tongue at the first sign of chest pain or an angina attack. 1 tablet may be used every 5 minutes as needed, for up to 15 minutes. Do not take more than 3 tablets in 15 minutes. If pain persist call 911 or go to the nearest ED.   *If you need a refill on your cardiac medications before your next appointment, please call your pharmacy*   Lab Work: Your physician recommends that you return for lab work in: when you return for your stress test   You can have these done at [address] at  [facility] You need to have labs done when you are fasting.  You can come Monday through Friday 8:00 am to 11:30 am and 1:00 to 4:00. You do not need to make an appointment as the order has already been placed. The labs you are going to have done are BMET, LFT, TSH, and lipids.   If you have labs (blood work) drawn today and your tests are completely normal, you will receive your results only by: MyChart Message (if you have MyChart) OR A paper copy in the mail If you have any lab test that is abnormal or we need to change your treatment, we will call you to review the results.   Testing/Procedures: You are scheduled for a Myocardial Perfusion Imaging Study.  Please arrive 15 minutes prior to your appointment time for registration and insurance purposes.  The test will take approximately 3 to 4 hours to complete; you may bring reading material.  If someone comes with you to your appointment, they will need to remain in the main lobby due to limited space in the testing area.   How to prepare for your Myocardial Perfusion Test: Do not eat or drink 3 hours prior to your test, except you may have water. Do not consume products containing caffeine (regular or decaffeinated) 12 hours prior to your test. (ex: coffee, chocolate, sodas, tea). Do bring a list of your current medications with you.  If not listed below, you may take your  medications as normal. Do wear comfortable clothes (no dresses or overalls) and walking shoes, tennis shoes preferred (No heels or open toe shoes are allowed). Do NOT wear cologne, perfume, aftershave, or lotions (deodorant is allowed). If these instructions are not followed, your test will have to be rescheduled.  If you cannot keep your appointment, please provide 24 hours notification to the Nuclear Lab, to avoid a possible $50 charge to your account.  Follow-Up: At [facility], you and your health needs are our priority.  As part of our continuing mission to provide you with exceptional heart care, we have created designated Provider Care Teams.  These Care Teams include your primary Cardiologist (physician) and Advanced Practice Providers (APPs -  Physician Assistants and Nurse Practitioners) who all work together to provide you with the care you need, when you need it.  We recommend signing up for the patient portal called "MyChart".  Sign up information is provided on this After Visit Summary.  MyChart is used to connect with patients for Virtual Visits (Telemedicine).  Patients are able to view lab/test results, encounter notes, upcoming appointments, etc.  Non-urgent messages can be sent to your provider as well.   To learn more about what you can do with MyChart, go to [email or web address].    Your next appointment:   9 month follow up   Cardiac  Nuclear Scan A cardiac nuclear scan is a test that is done to check the flow of blood to your heart. It is done when you are resting and when you are exercising. The test looks for problems such as: Not enough blood reaching a portion of the heart. The heart muscle not working as it should. You may need this test if you have: Heart disease. Lab results that are not normal. Had heart surgery or a balloon procedure to open up blocked arteries (angioplasty) or a small mesh tube (stent). Chest pain. Shortness of breath. Had a  heart attack. In this test, a special dye (tracer) is put into your bloodstream. The tracer will travel to your heart. A camera will then take pictures of your heart to see how the tracer moves through your heart. This test is usually done at a hospital and takes 2-4 hours. Tell a doctor about: Any allergies you have. All medicines you are taking, including vitamins, herbs, eye drops, creams, and over-the-counter medicines. Any bleeding problems you have. Any surgeries you have had. Any medical conditions you have. Whether you are pregnant or may be pregnant. Any history of asthma or long-term (chronic) lung disease. Any history of heart rhythm disorders or heart valve conditions. What are the risks? Your doctor will talk with you about risks. These may include: Serious chest pain and heart attack. This is only a risk if the stress portion of the test is done. Fast or uneven heartbeats (palpitations). A feeling of warmth in your chest. This feeling usually does not last long. Allergic reaction to the tracer. Shortness of breath or trouble breathing. What happens before the test? Ask your doctor about changing or stopping your normal medicines. Follow instructions from your doctor about what you cannot eat or drink. Remove your jewelry on the day of the test. Ask your doctor if you need to avoid nicotine or caffeine. What happens during the test? An IV tube will be inserted into one of your veins. Your doctor will give you a small amount of tracer through the IV tube. You will wait for 20-40 minutes while the tracer moves through your bloodstream. Your heart will be monitored with an electrocardiogram (ECG). You will lie down on an exam table. Pictures of your heart will be taken for about 15-20 minutes. You may also have a stress test. For this test, one of these things may be done: You will be asked to exercise on a treadmill or a stationary bike. You will be given medicines that  will make your heart work harder. This is done if you are unable to exercise. When blood flow to your heart has peaked, a tracer will again be given through the IV tube. After 20-40 minutes, you will get back on the exam table. More pictures will be taken of your heart. Depending on the tracer that is used, more pictures may need to be taken 3-4 hours later. Your IV tube will be removed when the test is over. The test may vary among doctors and hospitals. What happens after the test? Ask your doctor: Whether you can return to your normal schedule, including diet, activities, travel, and medicines. Whether you should drink more fluids. This will help to remove the tracer from your body. Ask your doctor, or the department that is doing the test: When will my results be ready? How will I get my results? What are my treatment options? What other tests do I need? What are my next steps?  This information is not intended to replace advice given to you by your health care provider. Make sure you discuss any questions you have with your health care provider. Document Revised: 04/07/2022 Document Reviewed: 04/07/2022 Elsevier Patient Education  2023 [employer].
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
## 2024-01-13 NOTE — Telephone Encounter (Signed)
Left pt a detailed message advised to call the office back

## 2024-01-19 ENCOUNTER — Telehealth (HOSPITAL_COMMUNITY): Payer: Self-pay | Admitting: *Deleted

## 2024-01-19 NOTE — Telephone Encounter (Signed)
 Patient given detailed instructions per Myocardial Perfusion Study Information Sheet for the test on 01/24/2024 at 10:15. Patient notified to arrive 15 minutes early and that it is imperative to arrive on time for appointment to keep from having the test rescheduled. If you need to cancel or reschedule your appointment, please call the office within 24 hours of your appointment. . Patient verbalized understanding.Daneil Dolin

## 2024-01-20 NOTE — Telephone Encounter (Signed)
 He gets this from his urologist, Dr. Marlou Porch

## 2024-01-21 NOTE — Telephone Encounter (Signed)
 Left  pt a detailed message regarding Dr Clent Ridges advise on Testosterone refil

## 2024-01-24 ENCOUNTER — Ambulatory Visit (HOSPITAL_COMMUNITY): Payer: BC Managed Care – PPO | Attending: Cardiology

## 2024-01-24 DIAGNOSIS — R079 Chest pain, unspecified: Secondary | ICD-10-CM | POA: Diagnosis present

## 2024-01-24 LAB — MYOCARDIAL PERFUSION IMAGING
Angina Index: 0
Estimated workload: 6.3
Exercise duration (min): 6 min
Exercise duration (sec): 2 s
LV dias vol: 57 mL (ref 62–150)
LV sys vol: 12 mL
MPHR: 167 {beats}/min
Nuc Stress EF: 51 %
Peak HR: 153 {beats}/min
Percent HR: 91 %
Rest HR: 91 {beats}/min
Rest Nuclear Isotope Dose: 10.3 mCi
SDS: 0
SRS: 0
SSS: 0
Stress Nuclear Isotope Dose: 32.9 mCi
TID: 0.97

## 2024-01-24 MED ORDER — TECHNETIUM TC 99M TETROFOSMIN IV KIT
10.3000 | PACK | Freq: Once | INTRAVENOUS | Status: AC | PRN
  Administered 2024-01-24: 10.3 via INTRAVENOUS

## 2024-01-24 MED ORDER — TECHNETIUM TC 99M TETROFOSMIN IV KIT
32.9000 | PACK | Freq: Once | INTRAVENOUS | Status: AC | PRN
  Administered 2024-01-24: 32.9 via INTRAVENOUS

## 2024-01-25 ENCOUNTER — Encounter: Payer: Self-pay | Admitting: Family Medicine

## 2024-01-25 NOTE — Telephone Encounter (Signed)
 Please submit a PA for pt Adderall

## 2024-01-31 ENCOUNTER — Other Ambulatory Visit (HOSPITAL_COMMUNITY): Payer: Self-pay

## 2024-02-04 ENCOUNTER — Ambulatory Visit: Payer: BC Managed Care – PPO | Admitting: Gastroenterology

## 2024-02-04 ENCOUNTER — Encounter: Payer: Self-pay | Admitting: Gastroenterology

## 2024-02-04 VITALS — BP 130/68 | HR 132 | Ht 66.0 in | Wt 171.0 lb

## 2024-02-04 DIAGNOSIS — K449 Diaphragmatic hernia without obstruction or gangrene: Secondary | ICD-10-CM

## 2024-02-04 DIAGNOSIS — K219 Gastro-esophageal reflux disease without esophagitis: Secondary | ICD-10-CM | POA: Diagnosis not present

## 2024-02-04 DIAGNOSIS — J849 Interstitial pulmonary disease, unspecified: Secondary | ICD-10-CM

## 2024-02-04 DIAGNOSIS — M350C Sjogren syndrome with dental involvement: Secondary | ICD-10-CM

## 2024-02-04 DIAGNOSIS — K2100 Gastro-esophageal reflux disease with esophagitis, without bleeding: Secondary | ICD-10-CM

## 2024-02-04 DIAGNOSIS — Z951 Presence of aortocoronary bypass graft: Secondary | ICD-10-CM

## 2024-02-04 NOTE — Progress Notes (Signed)
 Chief Complaint:    GERD  GI History: 53 year old male with a history of SLE, Sjogren's syndrome, ILD, OSA on CPAP, ADHD, history of PE, CAD  s/p 1V CABG 09/2021, depression, anxiety, avascular necrosis due to steroid use with chronic pain, follows in pain management clinic (intrathecal hydromorphone), history of drug use.  Follows in the GI clinic for longstanding history of reflux.  Reflux symptoms largely well controlled Prilosec 40 mg bid.  Will have breakthrough symptoms with any missed doses.  Breakthrough with previous attempts to titrate to lower dose.  History of dysphagia, that resolved when starting high-dose PPI.  Patient has been interested in antireflux surgical options as a means to better control his reflux, and potentially reduce the need for acid suppression therapy, particular with his concerns about bone fracture with his prior history of avascular necrosis.  GERD history: -Index symptoms: Heartburn, regurgitation -Exacerbating features: Greasy food, sugary food, eating close to bedtime -Medications trialed:  -Current medications: Prilosec 40 mg bid -Complications: Erosive esophagitis, hiatal hernia   GERD evaluation: -Last EGD: 04/2019 -Barium esophagram: -Esophageal Manometry: 06/2019: Incomplete bolus clearance with 7/10 swallows.  Normal peristalsis on 10/10 swallows.  1.6 cm HH -pH/Impedance: None to date -Bravo: None to date   Endoscopic History: - EGD (10/2013, Dr. Elnoria Howard): Mild erosive esophagitis, "probable" 3 cm sliding hiatal hernia - EGD (08/2017, Dr. Rhea Belton): Erosive esophagitis, linear ulcer in lower esophagus (biopsy with reflux changes with ulcer), 1 to 2 cm HH - EGD (04/2019, Dr. Barron Alvine): LA Grade A esophagitis, <2 cm HH, Hill Grade 2 valve, mild gastritis - EGD (09/2022): 1 cm sliding hiatal hernia, no erosive esophagitis, normal stomach and duodenum      HPI:     Patient is a 53 y.o. male presenting to the Gastroenterology Clinic for  follow-up.  He was last seen by Mike Gip on 12/08/2023.  He was still having reflux symptoms at that time.  Breakthrough symptoms with any missed doses of his Prilosec.  At that time, he indicated he wants to again be considered for antireflux surgery.  He was recommended to seek follow-up with cardiology and pulmonary clinics for medical optimization and clearance prior to any surgery and follow-up in this office afterwards.    He had follow-up in the Pulmonary Clinic on 12/09/2023 with plan for repeat high-res CT chest, PFTs, and encouraged to follow-up in the Rheumatology Clinic at [facility].  Per notes, would need further pulmonary evaluation for ILD and management of underlying connective tissue disease prior to any formal preop clearance.  He was then seen in the Cardiology Clinic on 01/12/2024 with plan for exercise stress Cardiolite due to CP with exertion and cold weather (was actually seen in the ER for the issue on 12/09/2023).  He was also seen in the [facility] clinic in 09/2023 for chronic sinusitis.  Laryngoscopy performed that day and notable for intact nasal septum with large perforation, no mucopurulent drainage and otherwise no suspicion for symptoms emanating from his sinonasal system and recommended follow-up with GI.  Of note, patient then requested referral to GI there.   Today, states he is not feeling well.  Thinks he has had sinus infection since Sunday night. No fever.  Tolerating p.o. intake.  No CP, SOB, DOE.  As before, has breakthrough reflux symptoms with any missed doses of his Prilosec.  No dysphagia.   Review of systems:     No chest pain, no SOB, no fevers, no urinary sx   Past Medical History:  Diagnosis Date   Angina pectoris (HCC) 10/06/2021   Anxiety    Asthma    early adulthood/ exercise induced   Atherosclerosis of aorta (HCC) 03/26/2021   Attention deficit hyperactivity disorder (ADHD) 04/03/2008   Qualifier: Diagnosis of  By: Linna Darner, CMA, Cindy      Avascular bone necrosis (HCC) 02/07/2020   Avascular necrosis (HCC)    of knees from long term steroid use   Backache 07/22/2007   Qualifier: Diagnosis of  By: Linna Darner, CMA, Cindy     Bladder neck obstruction 03/23/2011   Bone pain    chronic   CAD (coronary artery disease) severe LAD disease need for bypass 10/15/2021   Chronic kidney disease    just 1 kidney congenital   Chronic steroid use 11/10/2013   Coffee ground emesis 11/10/2013   Coronary atherosclerosis 10/06/2021   DEPRESSIVE DISORDER, RCR, MODERATE 06/30/2007   Qualifier: Diagnosis of  By: Clent Ridges MD, Tera Mater    DERMATOMYOSITIS 04/27/2007   Qualifier: Diagnosis of  By: Linna Darner, CMA, Cindy     Diarrhea 11/10/2013   Disease of pericardium 04/27/2007   Qualifier: Diagnosis of   By: Lenard Lance, Cindy      IMO SNOMED Dx Update Oct 2024     End of battery life of intrathecal infusion pump 09/07/2022   Esophageal ulcer    ETHMOID SINUSITIS 04/03/2008   Qualifier: Diagnosis of  By: Linna Darner, CMA, Cindy     Extremity pain 04/04/2013   Generalized anxiety disorder 04/03/2008   Qualifier: Diagnosis of  By: Linna Darner, CMA, Cindy     GERD 04/03/2008   Qualifier: Diagnosis of  By: Linna Darner, CMA, Cindy     GI bleed 11/09/2013   H/O long-term treatment with high-risk medication 02/22/2012   HEADACHE 10/01/2010   Qualifier: Diagnosis of  By: Clent Ridges MD, Tera Mater    Headache 10/01/2010   Qualifier: Diagnosis of   By: Clent Ridges MD, Tera Mater     IMO SNOMED Dx Update Oct 2024     Hematemesis 11/10/2013   Hiatal hernia    History of pulmonary embolism 10/18/2018   HLD (hyperlipidemia) 10/15/2021   HYPERHIDROSIS 04/27/2007   Qualifier: Diagnosis of  By: Linna Darner, CMA, Cindy     Hypogonadism, male 03/26/2009   Qualifier: Diagnosis of  By: Everardo All MD, Sean A    ILD (interstitial lung disease) (HCC) 12/09/2023   Impotence of organic origin 03/01/2012   Insomnia    INTERSTITIAL LUNG DISEASE 04/27/2007   Qualifier: Diagnosis of  By: Linna Darner, CMA, Cindy      Leukocytopenia 04/27/2007   Qualifier: Diagnosis of   By: Carma Lair      IMO SNOMED Dx Update Oct 2024     LEUKOPENIA, CHRONIC 04/27/2007   Qualifier: Diagnosis of  By: Linna Darner, CMA, Cindy     Low back pain    LUPUS 04/27/2007   Qualifier: Diagnosis of  By: Linna Darner, CMA, Cindy     N&V (nausea and vomiting) 11/10/2013   Neck pain 12/19/2012   OSA on CPAP 10/27/2016   Pain 02/25/2016   Pain syndrome, chronic 12/05/2012   PERICARDITIS 04/27/2007   Qualifier: Diagnosis of  By: Linna Darner, CMA, Cindy     PITUITARY INSUFFICIENCY 03/26/2009   Qualifier: Diagnosis of  By: Everardo All MD, Sean A    PLEURISY 04/27/2007   Qualifier: Diagnosis of  By: Linna Darner, CMA, Cindy     Pleurisy 04/27/2007   Qualifier: Diagnosis of   By: Linna Darner CMA, Cindy      IMO  SNOMED Dx Update Oct 2024     Presence of intrathecal pump 01/30/2020   Formatting of this note might be different from the original. Added automatically from request for surgery [id number]   Pulmonary embolism (HCC) 09/03/2011   Formatting of this note might be different from the original. somewhere between 2003-07 - took blood thinners for several months, then stopped   PULMONARY EMBOLISM, HX OF 04/27/2007   Qualifier: Diagnosis of  By: Linna Darner, CMA, Cindy     Pulmonary fibrosis (HCC)    RAYNAUD'S DISEASE 05/28/2009   Qualifier: Diagnosis of  By: Alphonzo Severance MD, Loni Dolly    S/P CABG x 1 10/19/2021   Formatting of this note might be different from the original. LIMA-LAD 10/18/21   Sjogren's syndrome (HCC)    Solitary kidney, congenital 02/07/2020   Substance abuse (HCC) 12/06/2019   Seen 12/01/2019 at [facility] for hallucinations after ingestion of  meth taken 11/28/2019.   Unstable angina (HCC) 10/14/2021   Varicose veins of both lower extremities 08/23/2023    Patient's surgical history, family medical history, social history, medications and allergies were all reviewed in Epic    Current Outpatient Medications  Medication Sig Dispense Refill    ALPRAZolam (XANAX) 1 MG tablet TAKE 1 TABLET BY MOUTH 3 TIMES A DAY AS NEEDED FOR ANXIETY 90 tablet 5   amphetamine-dextroamphetamine (ADDERALL) 20 MG tablet Take 1 tablet (20 mg total) by mouth 3 (three) times daily. 90 tablet 0   aspirin EC 81 MG EC tablet Take 1 tablet (81 mg total) by mouth daily. Swallow whole. 30 tablet 11   budesonide (PULMICORT) 1 MG/2ML nebulizer solution Place 0.6 mg into the nose 2 (two) times daily. To be placed in nasal saline irrigation to be done twice a day     famotidine (PEPCID) 40 MG tablet Take 1 tablet (40 mg total) by mouth daily. In the evening 30 tablet 6   fluconazole (DIFLUCAN) 150 MG tablet Take 1 tablet (150 mg total) by mouth as needed. 30 tablet 5   HYDROmorphone HCl (DILAUDID PO) Take by mouth. PUMP     mirtazapine (REMERON) 15 MG tablet Take 1 tablet by mouth at bedtime.     nitroGLYCERIN (NITROSTAT) 0.4 MG SL tablet Place 1 tablet (0.4 mg total) under the tongue every 5 (five) minutes as needed for chest pain. 25 tablet 6   omeprazole (PRILOSEC) 40 MG capsule Take 1 capsule (40 mg total) by mouth 2 (two) times daily. 60 capsule 0   omeprazole (PRILOSEC) 40 MG capsule Take 1 capsule (40 mg total) by mouth 2 (two) times daily. 60 capsule 11   ondansetron (ZOFRAN) 4 MG tablet Take 1 tablet (4 mg total) by mouth every 8 (eight) hours as needed for nausea or vomiting. 60 tablet 5   tadalafil (CIALIS) 20 MG tablet Take 1 tablet (20 mg total) by mouth daily as needed. 10 tablet 0   testosterone enanthate (DELATESTRYL) 200 MG/ML injection SMARTSIG:0.5 Milliliter(s) IM Once a Week     No current facility-administered medications for this visit.    Physical Exam:     BP 130/68   Pulse (!) 132   Ht 5\' 6"  (1.676 m)   Wt 171 lb (77.6 kg)   BMI 27.60 kg/m   GENERAL:  Pleasant male in NAD PSYCH: : Cooperative, normal affect Musculoskeletal:  Normal muscle tone, normal strength NEURO: Alert and oriented x 3, no focal neurologic  deficits   IMPRESSION and PLAN:    1) GERD 2)  Hiatal hernia 53 year old male with longstanding history of GERD.  Symptoms generally controlled with high-dose Prilosec, but breakthrough symptoms with any missed doses.  He is also concerned about the long-term safety profile of chronic PPI use given his clinical history.  We again had a long conversation today regarding antireflux surgery.  At this point, I do not think he is in an appropriate TIF candidate.  I have concerns regarding his history of connective tissue disorder and ability for appropriate postop healing and fusion.  We discussed potential laparoscopic surgery for hiatal hernia repair and fundoplication options, and he would like a referral to the surgical clinic to discuss that possibility.  - Continue Prilosec 40 mg twice daily as prescribed - No longer taking Pepcid as he saw no clinical difference - Referral to the surgical clinic to discuss possibility of lap HHR and fundoplication, pending Cardiology and pulmonary workups in place - As above, do not feel that he is an appropriate TIF candidate for multiple reasons - Continue antireflux lifestyle/dietary modifications  3) Sinusitis - Recommend contacting his PCM or his ENT today for evaluation and potential treatment  4) CAD status post CABG -Follow-up in the Cardiology Clinic as planned and  5) ILD - Follow-up in the Pulmonary Clinic as scheduled  6) Mixed connective tissue disorder 7) Sjogren's 8) SLE - Schedule follow-up with Rheumatology   I spent 35 minutes of time, including in depth chart review, independent review of results as outlined above, communicating results with the patient directly, face-to-face time with the patient, coordinating care, and ordering studies and medications as appropriate, and documentation.        Verlin Dike Kadia Abaya ,DO, FACG 02/04/2024, 10:03 AM

## 2024-02-04 NOTE — Patient Instructions (Addendum)
 _______________________________________________________  If your blood pressure at your visit was 140/90 or greater, please contact your primary care physician to follow up on this.  If you are age 54 or younger, your body mass index should be between 19-25. Your Body mass index is 27.6 kg/m. If this is out of the aformentioned range listed, please consider follow up with your Primary Care Provider.  ________________________________________________________  The [facility] providers would like to encourage you to use [facility] to communicate with providers for non-urgent requests or questions.  Due to long hold times on the telephone, sending your provider a message by [facility] may be a faster and more efficient way to get a response.  Please allow 48 business hours for a response.  Please remember that this is for non-urgent requests.  _______________________________________________________   Bonita Quin have been scheduled for an appointment with __________ at [facility]. Your appointment is on ________ at _______. Please arrive at ______ for registration. Make certain to bring a list of current medications, including any over the counter medications or vitamins. Also bring your co-pay if you have one as well as your insurance cards. [facility] is located at [address]. Should you need to reschedule your appointment, please contact them at [phone].  You will follow up in our office in 1 year.   It was a pleasure to see you today!  Vito Cirigliano, D.O.

## 2024-02-08 ENCOUNTER — Ambulatory Visit: Payer: BC Managed Care – PPO | Admitting: Internal Medicine

## 2024-02-08 ENCOUNTER — Encounter: Payer: Self-pay | Admitting: Internal Medicine

## 2024-02-08 NOTE — Telephone Encounter (Signed)
 PA request has been Denied. New Encounter has been or will be created for follow up. For additional info see Pharmacy Prior Auth telephone encounter from 12-27-2023.

## 2024-02-09 NOTE — Telephone Encounter (Signed)
 I suppose he will have to pay cash for this

## 2024-02-16 ENCOUNTER — Encounter: Payer: Self-pay | Admitting: Family Medicine

## 2024-02-16 ENCOUNTER — Ambulatory Visit: Admitting: Family Medicine

## 2024-02-16 VITALS — BP 108/60 | HR 100 | Temp 99.0°F | Resp 12 | Ht 66.0 in | Wt 171.2 lb

## 2024-02-16 DIAGNOSIS — R5383 Other fatigue: Secondary | ICD-10-CM | POA: Diagnosis not present

## 2024-02-16 DIAGNOSIS — R634 Abnormal weight loss: Secondary | ICD-10-CM | POA: Diagnosis not present

## 2024-02-16 LAB — COMPREHENSIVE METABOLIC PANEL WITH GFR
ALT: 16 U/L (ref 0–53)
AST: 25 U/L (ref 0–37)
Albumin: 3.7 g/dL (ref 3.5–5.2)
Alkaline Phosphatase: 71 U/L (ref 39–117)
BUN: 16 mg/dL (ref 6–23)
CO2: 32 meq/L (ref 19–32)
Calcium: 8.9 mg/dL (ref 8.4–10.5)
Chloride: 102 meq/L (ref 96–112)
Creatinine, Ser: 1.06 mg/dL (ref 0.40–1.50)
GFR: 80.13 mL/min (ref >60.00)
Glucose, Bld: 72 mg/dL (ref 70–99)
Potassium: 4.3 meq/L (ref 3.5–5.1)
Sodium: 138 meq/L (ref 135–145)
Total Bilirubin: 0.4 mg/dL (ref 0.2–1.2)
Total Protein: 8.2 g/dL (ref 6.0–8.3)

## 2024-02-16 LAB — CBC
HCT: 39.6 % (ref 39.0–52.0)
Hemoglobin: 13.2 g/dL (ref 13.0–17.0)
MCHC: 33.3 g/dL (ref 30.0–36.0)
MCV: 97.1 fl (ref 78.0–100.0)
Platelets: 196 10*3/uL (ref 150.0–400.0)
RBC: 4.08 Mil/uL — ABNORMAL LOW (ref 4.22–5.81)
RDW: 13.9 % (ref 11.5–15.5)
WBC: 4 10*3/uL (ref 4.0–10.5)

## 2024-02-16 NOTE — Progress Notes (Unsigned)
 ACUTE VISIT Chief Complaint  Patient presents with   cold   Weight Loss    10 lbs   HPI: [name] is a 53 y.o. male, who is here today complaining of unintentional weight loss, fatigue, and constantly feeling cold.  He reports "always feeling very cold", especially in the mornings, ongoing for about 1 year.  Fatigue getting worse and noted wt loss in the past couple months.   His normal weight would fluctuate between 180-185 lbs More recently he has lost about 10 lbs unintentionally and was 171 lbs this morning.  He has made no recent changes to eating habits or exercise, although he reports hiatal hernia and acid reflux, for which he is following with GI. He is established with GI and last followed up on 02/04/2024.  He has not had a colonoscopy done, but had an upper endoscopy done in 09/2022.   He endorses nausea, which he attributes to his acid reflux exacerbated by his hiatal hernia.  Taking omeprazole 40 mg BID to manage this acid reflux.  Denies taking any new medications that may cause any weight loss.   Pt endorses ongoing fatigue throughout the day.  He has a hx of sleep apnea and is compliant with his CPAP.  On average he sleeps 6-7 hours per night.  Pt reports good sleep quality and wakes up feeling well rested.  Denies any depression or anxiety symptoms.  Denies any headaches, chest pain, dyspnea, palpitations, changes in bowel habits,abdominal pain, and vomiting.   Lab Results  Component Value Date   WBC 4.5 10/15/2021   HGB 13.6 10/15/2021   HCT 40.9 10/15/2021   MCV 97.6 10/15/2021   PLT 133 (L) 10/15/2021   Lab Results  Component Value Date   NA 138 10/15/2021   CL 105 10/15/2021   K 4.2 10/15/2021   CO2 27 10/15/2021   BUN 16 10/15/2021   CREATININE 1.15 10/15/2021   EGFR 89.0 12/09/2023   CALCIUM 8.7 (L) 10/15/2021   ALBUMIN 3.5 09/15/2019   GLUCOSE 97 10/15/2021   Lab Results  Component Value Date   ALT 13 09/15/2019   AST 19  09/15/2019   ALKPHOS 72 09/15/2019   BILITOT 0.4 09/15/2019   Review of Systems  Constitutional:  Negative for activity change, appetite change, chills and fever.  HENT:  Negative for sore throat and trouble swallowing.   Respiratory:  Negative for cough and wheezing.   Cardiovascular:  Negative for leg swelling.  Genitourinary:  Negative for decreased urine volume, dysuria and hematuria.  Skin:  Negative for rash.  Neurological:  Negative for syncope and weakness.  Psychiatric/Behavioral:  Negative for confusion and hallucinations.   See other pertinent positives and negatives in HPI.  Current Outpatient Medications on File Prior to Visit  Medication Sig Dispense Refill   ALPRAZolam (XANAX) 1 MG tablet TAKE 1 TABLET BY MOUTH 3 TIMES A DAY AS NEEDED FOR ANXIETY 90 tablet 5   amphetamine-dextroamphetamine (ADDERALL) 20 MG tablet Take 1 tablet (20 mg total) by mouth 3 (three) times daily. 90 tablet 0   aspirin EC 81 MG EC tablet Take 1 tablet (81 mg total) by mouth daily. Swallow whole. 30 tablet 11   budesonide (PULMICORT) 1 MG/2ML nebulizer solution Place 0.6 mg into the nose 2 (two) times daily. To be placed in nasal saline irrigation to be done twice a day     famotidine (PEPCID) 40 MG tablet Take 1 tablet (40 mg total) by mouth daily. In  the evening 30 tablet 6   fluconazole (DIFLUCAN) 150 MG tablet Take 1 tablet (150 mg total) by mouth as needed. 30 tablet 5   HYDROmorphone HCl (DILAUDID PO) Take by mouth. PUMP     mirtazapine (REMERON) 15 MG tablet Take 1 tablet by mouth at bedtime.     nitroGLYCERIN (NITROSTAT) 0.4 MG SL tablet Place 1 tablet (0.4 mg total) under the tongue every 5 (five) minutes as needed for chest pain. 25 tablet 6   omeprazole (PRILOSEC) 40 MG capsule Take 1 capsule (40 mg total) by mouth 2 (two) times daily. 60 capsule 0   omeprazole (PRILOSEC) 40 MG capsule Take 1 capsule (40 mg total) by mouth 2 (two) times daily. 60 capsule 11   ondansetron (ZOFRAN) 4 MG  tablet Take 1 tablet (4 mg total) by mouth every 8 (eight) hours as needed for nausea or vomiting. 60 tablet 5   tadalafil (CIALIS) 20 MG tablet Take 1 tablet (20 mg total) by mouth daily as needed. 10 tablet 0   testosterone enanthate (DELATESTRYL) 200 MG/ML injection SMARTSIG:0.5 Milliliter(s) IM Once a Week     No current facility-administered medications on file prior to visit.    Past Medical History:  Diagnosis Date   Angina pectoris (HCC) 10/06/2021   Anxiety    Asthma    early adulthood/ exercise induced   Atherosclerosis of aorta (HCC) 03/26/2021   Attention deficit hyperactivity disorder (ADHD) 04/03/2008   Qualifier: Diagnosis of  By: Linna Darner, CMA, Cindy     Avascular bone necrosis (HCC) 02/07/2020   Avascular necrosis (HCC)    of knees from long term steroid use   Backache 07/22/2007   Qualifier: Diagnosis of  By: Linna Darner, CMA, Cindy     Bladder neck obstruction 03/23/2011   Bone pain    chronic   CAD (coronary artery disease) severe LAD disease need for bypass 10/15/2021   Chronic kidney disease    just 1 kidney congenital   Chronic steroid use 11/10/2013   Coffee ground emesis 11/10/2013   Coronary atherosclerosis 10/06/2021   DEPRESSIVE DISORDER, RCR, MODERATE 06/30/2007   Qualifier: Diagnosis of  By: Clent Ridges MD, Tera Mater    DERMATOMYOSITIS 04/27/2007   Qualifier: Diagnosis of  By: Linna Darner, CMA, Cindy     Diarrhea 11/10/2013   Disease of pericardium 04/27/2007   Qualifier: Diagnosis of   By: Lenard Lance, Cindy      IMO SNOMED Dx Update Oct 2024     End of battery life of intrathecal infusion pump 09/07/2022   Esophageal ulcer    ETHMOID SINUSITIS 04/03/2008   Qualifier: Diagnosis of  By: Linna Darner, CMA, Cindy     Extremity pain 04/04/2013   Generalized anxiety disorder 04/03/2008   Qualifier: Diagnosis of  By: Linna Darner, CMA, Cindy     GERD 04/03/2008   Qualifier: Diagnosis of  By: Linna Darner, CMA, Cindy     GI bleed 11/09/2013   H/O long-term treatment with high-risk  medication 02/22/2012   HEADACHE 10/01/2010   Qualifier: Diagnosis of  By: Clent Ridges MD, Tera Mater    Headache 10/01/2010   Qualifier: Diagnosis of   By: Clent Ridges MD, Tera Mater     IMO SNOMED Dx Update Oct 2024     Hematemesis 11/10/2013   Hiatal hernia    History of pulmonary embolism 10/18/2018   HLD (hyperlipidemia) 10/15/2021   HYPERHIDROSIS 04/27/2007   Qualifier: Diagnosis of  By: Linna Darner, CMA, Cindy     Hypogonadism, male 03/26/2009   Qualifier: Diagnosis of  By: Everardo All MD, Sean A    ILD (interstitial lung disease) (HCC) 12/09/2023   Impotence of organic origin 03/01/2012   Insomnia    INTERSTITIAL LUNG DISEASE 04/27/2007   Qualifier: Diagnosis of  By: Linna Darner, CMA, Cindy     Leukocytopenia 04/27/2007   Qualifier: Diagnosis of   By: Carma Lair      IMO SNOMED Dx Update Oct 2024     LEUKOPENIA, CHRONIC 04/27/2007   Qualifier: Diagnosis of  By: Linna Darner, CMA, Cindy     Low back pain    LUPUS 04/27/2007   Qualifier: Diagnosis of  By: Linna Darner, CMA, Cindy     N&V (nausea and vomiting) 11/10/2013   Neck pain 12/19/2012   OSA on CPAP 10/27/2016   Pain 02/25/2016   Pain syndrome, chronic 12/05/2012   PERICARDITIS 04/27/2007   Qualifier: Diagnosis of  By: Linna Darner, CMA, Cindy     PITUITARY INSUFFICIENCY 03/26/2009   Qualifier: Diagnosis of  By: Everardo All MD, Sean A    PLEURISY 04/27/2007   Qualifier: Diagnosis of  By: Linna Darner, CMA, Cindy     Pleurisy 04/27/2007   Qualifier: Diagnosis of   By: Lenard Lance, Cindy      IMO SNOMED Dx Update Oct 2024     Presence of intrathecal pump 01/30/2020   Formatting of this note might be different from the original. Added automatically from request for surgery [id number]   Pulmonary embolism (HCC) 09/03/2011   Formatting of this note might be different from the original. somewhere between 2003-07 - took blood thinners for several months, then stopped   PULMONARY EMBOLISM, HX OF 04/27/2007   Qualifier: Diagnosis of  By: Linna Darner, CMA, Cindy     Pulmonary  fibrosis (HCC)    RAYNAUD'S DISEASE 05/28/2009   Qualifier: Diagnosis of  By: Alphonzo Severance MD, Loni Dolly    S/P CABG x 1 10/19/2021   Formatting of this note might be different from the original. LIMA-LAD 10/18/21   Sjogren's syndrome (HCC)    Solitary kidney, congenital 02/07/2020   Substance abuse (HCC) 12/06/2019   Seen 12/01/2019 at [facility] for hallucinations after ingestion of  meth taken 11/28/2019.   Unstable angina (HCC) 10/14/2021   Varicose veins of both lower extremities 08/23/2023   Allergies  Allergen Reactions   Armoracia Rusticana Ext (Horseradish) Anaphylaxis, Hives and Shortness Of Breath   Other Hives    -Cocktail Sauce    Morphine And Codeine Itching   Trimethoprim Other (See Comments)    Kidney Toxicity     Rocephin [Ceftriaxone Sodium In Dextrose]     When taking over a longer period developed severe body aches, shaking, and headaches    Sulfamethoxazole Rash    Social History   Socioeconomic History   Marital status: Married    Spouse name: Not on file   Number of children: 3   Years of education: Not on file   Highest education level: Not on file  Occupational History   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  Tobacco Use   Smoking status: Never   Smokeless tobacco: Never  Vaping Use   Vaping status: Never Used  Substance and Sexual Activity   Alcohol use: Yes    Alcohol/week: 0.0 standard drinks of alcohol    Comment: rare   Drug use: Not Currently    Types: Methamphetamines   Sexual activity: Not on file  Other Topics Concern   Not on file  Social History Narrative   On disablitity   Social  Drivers of Corporate investment banker Strain: Not on file  Food Insecurity: Not on file  Transportation Needs: No Transportation Needs (10/17/2021)   Received from [facility] - Transportation  Physical Activity: Not on file  Stress: Stress Concern Present (01/11/2023)   Received from [facility] - Occupational Stress Questionnaire    Feeling of Stress : To some extent  Social Connections: Unknown (04/04/2022)   Received from [facility]   Social Network    Social Network: Not on file   Vitals:   02/16/24 1047  BP: 108/60  Pulse: 100  Resp: 12  Temp: 99 F (37.2 C)  SpO2: 96%   Wt Readings from Last 3 Encounters:  02/16/24 171 lb 3.2 oz (77.7 kg)  02/04/24 171 lb (77.6 kg)  01/24/24 174 lb (78.9 kg)   Body mass index is 27.63 kg/m.  Physical Exam Vitals and nursing note reviewed.  Constitutional:      General: He is not in acute distress.    Appearance: He is well-developed. He is not ill-appearing.  HENT:     Head: Normocephalic and atraumatic.     Mouth/Throat:     Mouth: Mucous membranes are moist.     Pharynx: Oropharynx is clear.  Eyes:     Conjunctiva/sclera: Conjunctivae normal.  Neck:     Thyroid: No thyroid mass or thyromegaly.  Cardiovascular:     Rate and Rhythm: Normal rate and regular rhythm.     Heart sounds: No murmur heard. Pulmonary:     Effort: Pulmonary effort is normal. No respiratory distress.     Breath sounds: Normal breath sounds.  Abdominal:     Palpations: Abdomen is soft. There is no mass.     Tenderness: There is no abdominal tenderness.  Musculoskeletal:        General: No tenderness.     Cervical back: No muscular tenderness.     Right lower leg: No edema.     Left lower leg: No edema.  Lymphadenopathy:     Cervical: No cervical adenopathy.  Skin:    General: Skin is warm.     Findings: No erythema, lesion or rash.  Neurological:     General: No focal deficit present.     Mental Status: He is alert and oriented to person, place, and time.     Cranial Nerves: No cranial nerve deficit.  Psychiatric:        Mood and Affect: Mood and affect normal.    ASSESSMENT AND PLAN: [name] was seen today for fatigue, unintentional weight loss,  and feeling cold.  Lab Results  Component Value Date   NA 138 02/16/2024   CL 102 02/16/2024   K 4.3 02/16/2024   CO2 32 02/16/2024   BUN 16 02/16/2024   CREATININE 1.06 02/16/2024   GFR 80.13 02/16/2024   CALCIUM 8.9 02/16/2024   ALBUMIN 3.7 02/16/2024   GLUCOSE 72 02/16/2024   Lab Results  Component Value Date   ALT 16 02/16/2024   AST 25 02/16/2024   ALKPHOS 71 02/16/2024   BILITOT 0.4 02/16/2024   Lab Results  Component Value Date   WBC 4.0 02/16/2024   HGB 13.2 02/16/2024   HCT 39.6 02/16/2024   MCV 97.1 02/16/2024   PLT 196.0 02/16/2024   Lab Results  Component Value Date   TSH 1.92 02/16/2024   Fatigue, unspecified type Associated  with feeling sole for almost a year. Last TSH in 2015 normal at 2.9. We discussed possible etiologies. Some of his chronic comorbidities could be contributing factors. Monitor for new symptoms. Further recommendation will be given according to lab results.  -     Comprehensive metabolic panel; Future -     CBC; Future -     TSH; Future  Unintentional weight loss Continue monitoring weight at home. GERD seems to be symptomatic at this time and could be contributing some to his wt loss as well as medications like Adderall. Adequate amount of protein intake encouraged. He has not had colon cancer screening, follows with GI. Follows with urologist, last PSA 0.2 on 01/18/2024. Follow-up with PCP in 4 weeks.  -     Comprehensive metabolic panel; Future -     CBC; Future -     TSH; Future  Return in 4 weeks (on 03/15/2024), or if symptoms worsen or fail to improve, for wt and fatigue with PCP.  I, [name], acting as a scribe for Ethan Reichl [place], MD., have documented all relevant documentation on the behalf of Ethan Marzette [place], MD, as directed by  Ethan Bedgood [place], MD while in the presence of Ethan Landgren [place], MD.  I, Ethan Oakland [place], MD, have reviewed all documentation for this visit. The documentation on 02/16/24 for the exam, diagnosis,  procedures, and orders are all accurate and complete.  [name]. [place], MD  [facility]. [place] office.

## 2024-02-16 NOTE — Patient Instructions (Addendum)
 A few things to remember from today's visit:  Fatigue, unspecified type - Plan: Comprehensive metabolic panel, CBC, TSH  Unintentional weight loss - Plan: Comprehensive metabolic panel, CBC, TSH  Continue monitoring weight. Be sure to eat adequate amount of protein. Please follow with Dr Clent Ridges in 4 weeks.  Do not use My Chart to request refills or for acute issues that need immediate attention. If you send a my chart message, it may take a few days to be addressed, specially if I am not in the office.  Please be sure medication list is accurate. If a new problem present, please set up appointment sooner than planned today.

## 2024-02-18 ENCOUNTER — Encounter: Payer: Self-pay | Admitting: Family Medicine

## 2024-02-18 LAB — TSH: TSH: 1.92 u[IU]/mL (ref 0.35–5.50)

## 2024-02-21 ENCOUNTER — Telehealth: Payer: Self-pay

## 2024-02-21 NOTE — Telephone Encounter (Signed)
 13 pages of records faxed to [facility] on 02-08-24.  Patient is scheduled to see Dr Fredricka Bonine on 02-24-24 at 10:00am.

## 2024-03-02 ENCOUNTER — Other Ambulatory Visit: Payer: Self-pay | Admitting: Family Medicine

## 2024-03-03 ENCOUNTER — Other Ambulatory Visit: Payer: Self-pay

## 2024-03-03 MED ORDER — AMPHETAMINE-DEXTROAMPHETAMINE 20 MG PO TABS: 20.0000 mg | ORAL_TABLET | Freq: Three times a day (TID) | ORAL | 0 refills | Status: DC

## 2024-03-03 NOTE — Telephone Encounter (Signed)
 Done

## 2024-03-08 ENCOUNTER — Encounter: Payer: Self-pay | Admitting: Family Medicine

## 2024-03-08 ENCOUNTER — Ambulatory Visit: Payer: Self-pay
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
## 2024-03-13 ENCOUNTER — Telehealth: Payer: Self-pay

## 2024-03-13 NOTE — Telephone Encounter (Signed)
 Copied from CRM [id number]. Topic: Clinical - Medication Question >> Mar 09, 2024 10:39 AM Ovid Blow wrote: Reason for CRM: Bridget[facility] from [facility] needs the ok from Dr. Alyne Babinski to fill medication ALPRAZolam  (XANAX ) 1 MG tablet

## 2024-03-14 MED ORDER — ALPRAZOLAM 1 MG PO TABS: 1.0000 mg | ORAL_TABLET | Freq: Three times a day (TID) | ORAL | 5 refills | Status: DC | PRN

## 2024-03-14 NOTE — Telephone Encounter (Signed)
 Pt LOV was on 02/16/24 Last refill done on 10/08/23 Please advise

## 2024-03-14 NOTE — Addendum Note (Signed)
 Addended by: Corita Diego A on: 03/14/2024 04:44 PM   Modules accepted: Orders

## 2024-03-14 NOTE — Telephone Encounter (Signed)
 Done

## 2024-03-16 NOTE — Telephone Encounter (Signed)
 Done
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
## 2024-03-23 MED ORDER — ALPRAZOLAM 1 MG PO TABS: 1.0000 mg | ORAL_TABLET | Freq: Three times a day (TID) | ORAL | 0 refills | Status: DC | PRN

## 2024-03-23 NOTE — Telephone Encounter (Signed)
 I sent in a one month supply of Xanax 

## 2024-03-23 NOTE — Telephone Encounter (Signed)
 Done

## 2024-04-28 ENCOUNTER — Other Ambulatory Visit: Payer: Self-pay | Admitting: Family Medicine

## 2024-05-04 DIAGNOSIS — Z419 Encounter for procedure for purposes other than remedying health state, unspecified: Secondary | ICD-10-CM | POA: Diagnosis not present

## 2024-05-14 ENCOUNTER — Encounter: Payer: Self-pay | Admitting: Family Medicine

## 2024-05-15 ENCOUNTER — Ambulatory Visit: Admitting: Internal Medicine

## 2024-05-15 ENCOUNTER — Encounter: Payer: Self-pay | Admitting: Internal Medicine

## 2024-05-15 DIAGNOSIS — G894 Chronic pain syndrome: Secondary | ICD-10-CM | POA: Diagnosis not present

## 2024-05-15 DIAGNOSIS — M5440 Lumbago with sciatica, unspecified side: Secondary | ICD-10-CM | POA: Diagnosis not present

## 2024-05-15 DIAGNOSIS — M5030 Other cervical disc degeneration, unspecified cervical region: Secondary | ICD-10-CM | POA: Diagnosis not present

## 2024-05-15 DIAGNOSIS — Z79899 Other long term (current) drug therapy: Secondary | ICD-10-CM | POA: Diagnosis not present

## 2024-05-15 DIAGNOSIS — Z978 Presence of other specified devices: Secondary | ICD-10-CM | POA: Diagnosis not present

## 2024-05-15 DIAGNOSIS — M329 Systemic lupus erythematosus, unspecified: Secondary | ICD-10-CM | POA: Diagnosis not present

## 2024-05-15 DIAGNOSIS — Z5181 Encounter for therapeutic drug level monitoring: Secondary | ICD-10-CM | POA: Diagnosis not present

## 2024-05-15 MED ORDER — AMPHETAMINE-DEXTROAMPHETAMINE 20 MG PO TABS: 20.0000 mg | ORAL_TABLET | Freq: Three times a day (TID) | ORAL | 0 refills | Status: DC

## 2024-05-15 NOTE — Telephone Encounter (Signed)
 Done
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
## 2024-05-17 ENCOUNTER — Encounter: Payer: Self-pay | Admitting: Surgery

## 2024-05-17 DIAGNOSIS — K449 Diaphragmatic hernia without obstruction or gangrene: Secondary | ICD-10-CM | POA: Diagnosis not present

## 2024-05-24 ENCOUNTER — Telehealth: Payer: Self-pay | Admitting: Internal Medicine

## 2024-05-24 NOTE — Telephone Encounter (Signed)
  Former Hospital doctor patient with ILD. Last PFT feb 2025. Needs to be seen herein our ILD center  Plan  - give spiro and dlco and first avail with me x 30 min     SIGNATURE    Dr. Dorethia Cave, M.D., F.C.C.P,  Pulmonary and Critical Care Medicine Staff Physician, [facility] Center Director - Interstitial Lung Disease  Program  Pulmonary Fibrosis [facility] at [facility] [place], [place], [address]   Pager: [phone], If no answer  -> Check AMION or Try [phone] Telephone (clinical office): [phone] Telephone (research): [phone]  11:51 AM 05/24/2024        Latest Ref Rng & Units 01/10/2024    4:47 PM 11/29/2019   12:07 PM  PFT Results  FVC-Pre L 2.52  2.37   FVC-Predicted Pre % 58  53   FVC-Post L 2.52  2.36   FVC-Predicted Post % 58  53   Pre FEV1/FVC % % 80  82   Post FEV1/FCV % % 83  87   FEV1-Pre L 2.01  1.95   FEV1-Predicted Pre % 60  56   FEV1-Post L 2.10  2.06   DLCO uncorrected ml/min/mmHg 16.38  20.77   DLCO UNC% % 64  79   DLCO corrected ml/min/mmHg 16.88    DLCO COR %Predicted % 66    DLVA Predicted % 112  135   TLC L 3.05  3.55   TLC % Predicted % 49  57   RV % Predicted % 12  57

## 2024-05-24 NOTE — Telephone Encounter (Signed)
 Made appts/ [facility]

## 2024-06-03 DIAGNOSIS — Z419 Encounter for procedure for purposes other than remedying health state, unspecified: Secondary | ICD-10-CM | POA: Diagnosis not present

## 2024-06-09 ENCOUNTER — Encounter: Payer: Self-pay | Admitting: Family Medicine

## 2024-06-12 ENCOUNTER — Telehealth: Payer: Self-pay

## 2024-06-12 NOTE — Telephone Encounter (Signed)
 Please call them to okay an early refill

## 2024-06-12 NOTE — Telephone Encounter (Signed)
 Spoke with pt pharmacy regarding early refill for pt xanax  state that they filled Rx but pt did not pick up. Pt notified voiced understanding Copied from CRM [id number]. Topic: Clinical - Medication Prior Auth >> Jun 09, 2024  4:02 PM Deleta RAMAN wrote: Reason for CRM: patient needs authorization to pick up medication on today 7/18 instead 7/21 due to going on vacation. A good contact number is [phone]

## 2024-06-14 NOTE — Telephone Encounter (Signed)
 Spoke with pt advised that I spoke with his pharmacy but did not notify him to pick up refill, pt stated that he was already out of town and that he would be ok since his trip was cut short and he will be returning home early than expected

## 2024-06-16 ENCOUNTER — Telehealth: Payer: Self-pay | Admitting: Gastroenterology

## 2024-06-16 ENCOUNTER — Other Ambulatory Visit: Payer: Self-pay | Admitting: Family Medicine

## 2024-06-16 ENCOUNTER — Encounter: Payer: Self-pay | Admitting: Family Medicine

## 2024-06-16 NOTE — Telephone Encounter (Signed)
 Copied from CRM [id number]. Topic: Clinical - Medication Refill >> Jun 16, 2024  1:28 PM Shardie S wrote: Medication: amphetamine -dextroamphetamine  (ADDERALL) 20 MG tablet  Has the patient contacted their pharmacy? Yes (Agent: If no, request that the patient contact the pharmacy for the refill. If patient does not wish to contact the pharmacy document the reason why and proceed with request.) (Agent: If yes, when and what did the pharmacy advise?)  This is the patient's preferred pharmacy:  [facility] [address] [place], [place] - [address] AT [facility] [address] [place] [address] Phone: [phone] Fax: [phone]    Is this the correct pharmacy for this prescription? Yes If no, delete pharmacy and type the correct one.   Has the prescription been filled recently? No  Is the patient out of the medication? Yes  Has the patient been seen for an appointment in the last year OR does the patient have an upcoming appointment? Yes  Can we respond through MyChart? Yes  Agent: Please be advised that Rx refills may take up to 3 business days. We ask that you follow-up with your pharmacy.

## 2024-06-16 NOTE — Telephone Encounter (Signed)
 Beth,  Referral in [facility] for a esophageal manometry from [facility]

## 2024-06-19 ENCOUNTER — Telehealth: Payer: Self-pay | Admitting: Adult Health

## 2024-06-19 NOTE — Telephone Encounter (Signed)
 Copied from CRM [id number]. Topic: Clinical - Order For Equipment >> Jun 19, 2024  4:08 PM Celestine FALCON wrote: Reason for CRM: Lysel from [employer] stated the CMN form faxed multiple times, the latest being 7/15, was faxed to [phone] with no follow up. The order was for 6/19 for cpap supplies. Please have provider NP Tammy Parrett or Dr. Geronimo complete and fax back at fax number [phone].

## 2024-06-20 DIAGNOSIS — K449 Diaphragmatic hernia without obstruction or gangrene: Secondary | ICD-10-CM | POA: Diagnosis not present

## 2024-06-20 NOTE — Telephone Encounter (Signed)
 Fax has been received and given to [employer]. Routing to [place] to follow up.

## 2024-06-20 NOTE — Telephone Encounter (Signed)
 CMN placed in sign folder for Tammy to address.

## 2024-06-20 NOTE — Telephone Encounter (Signed)
 He already has refills until September

## 2024-06-23 ENCOUNTER — Other Ambulatory Visit: Payer: Self-pay

## 2024-06-23 NOTE — Telephone Encounter (Signed)
 Bari,  It looks like the patient has had his manometry study done through [facility]. I am cancelling the case I arranged for October. You can close the referral. Thanks

## 2024-06-26 ENCOUNTER — Telehealth: Payer: Self-pay | Admitting: *Deleted

## 2024-06-26 NOTE — Telephone Encounter (Signed)
 Copied from CRM [id number]. Topic: General - Other >> Jun 14, 2024 11:56 AM [name] wrote: Reason for CRM: Mel from [employer] is re faxing a request for the 3rd time for Tammy Parrett to sign.

## 2024-06-26 NOTE — Telephone Encounter (Signed)
 CMN signed by Madelin Stank NP and faxed to [employer].  Received confirmation fax that fax was sent successfully.  Sent to scan.  Nothing further needed.

## 2024-06-27 DIAGNOSIS — G4733 Obstructive sleep apnea (adult) (pediatric): Secondary | ICD-10-CM | POA: Diagnosis not present

## 2024-06-28 ENCOUNTER — Telehealth: Payer: Self-pay | Admitting: *Deleted

## 2024-06-28 NOTE — Telephone Encounter (Signed)
 Copied from CRM [id number]. Topic: Clinical - Order For Equipment >> Jun 14, 2024 11:50 AM Celestine FALCON wrote: Reason for CRM: Mel with [employer] will be faxing over a CMN form to be signed and sent back from NP Tammy Parrett. Mel wanted the provider to be aware so it can be received timely. Mel didn't provide a fax number to return the CMN form to.  Duplicate, see message from 06/27/2023.

## 2024-07-03 NOTE — Telephone Encounter (Addendum)
 CMN for CPAP/supplies signed and faxed back to [employer] [phone]).

## 2024-07-03 NOTE — Telephone Encounter (Signed)
 Received confirmation that the fax was sent successfully.

## 2024-07-04 DIAGNOSIS — Z419 Encounter for procedure for purposes other than remedying health state, unspecified: Secondary | ICD-10-CM | POA: Diagnosis not present

## 2024-07-19 DIAGNOSIS — M7989 Other specified soft tissue disorders: Secondary | ICD-10-CM | POA: Diagnosis not present

## 2024-07-19 DIAGNOSIS — I83813 Varicose veins of bilateral lower extremities with pain: Secondary | ICD-10-CM | POA: Diagnosis not present

## 2024-07-19 DIAGNOSIS — I8311 Varicose veins of right lower extremity with inflammation: Secondary | ICD-10-CM | POA: Diagnosis not present

## 2024-07-19 DIAGNOSIS — I83893 Varicose veins of bilateral lower extremities with other complications: Secondary | ICD-10-CM | POA: Diagnosis not present

## 2024-07-19 DIAGNOSIS — I8312 Varicose veins of left lower extremity with inflammation: Secondary | ICD-10-CM | POA: Diagnosis not present

## 2024-07-19 DIAGNOSIS — M79661 Pain in right lower leg: Secondary | ICD-10-CM | POA: Diagnosis not present

## 2024-07-27 ENCOUNTER — Encounter: Payer: Self-pay | Admitting: Family Medicine

## 2024-07-28 ENCOUNTER — Other Ambulatory Visit: Payer: Self-pay | Admitting: *Deleted

## 2024-07-28 DIAGNOSIS — J849 Interstitial pulmonary disease, unspecified: Secondary | ICD-10-CM

## 2024-07-28 DIAGNOSIS — G4733 Obstructive sleep apnea (adult) (pediatric): Secondary | ICD-10-CM | POA: Diagnosis not present

## 2024-07-28 MED ORDER — AMPHETAMINE-DEXTROAMPHETAMINE 10 MG PO TABS: 20.0000 mg | ORAL_TABLET | Freq: Three times a day (TID) | ORAL | 0 refills | Status: DC

## 2024-07-28 NOTE — Telephone Encounter (Signed)
 That would get very confusing. Instead I have sent in for 10 mg tablets to take 2 tabs three times a day
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
## 2024-07-31 ENCOUNTER — Ambulatory Visit (INDEPENDENT_AMBULATORY_CARE_PROVIDER_SITE_OTHER): Payer: Self-pay | Admitting: Internal Medicine

## 2024-07-31 ENCOUNTER — Encounter

## 2024-07-31 DIAGNOSIS — J849 Interstitial pulmonary disease, unspecified: Secondary | ICD-10-CM

## 2024-08-04 DIAGNOSIS — I83891 Varicose veins of right lower extremities with other complications: Secondary | ICD-10-CM | POA: Diagnosis not present

## 2024-08-04 DIAGNOSIS — Z419 Encounter for procedure for purposes other than remedying health state, unspecified: Secondary | ICD-10-CM | POA: Diagnosis not present

## 2024-08-11 DIAGNOSIS — I83892 Varicose veins of left lower extremities with other complications: Secondary | ICD-10-CM | POA: Diagnosis not present

## 2024-08-11 DIAGNOSIS — Z09 Encounter for follow-up examination after completed treatment for conditions other than malignant neoplasm: Secondary | ICD-10-CM | POA: Diagnosis not present

## 2024-08-15 DIAGNOSIS — I83891 Varicose veins of right lower extremities with other complications: Secondary | ICD-10-CM | POA: Diagnosis not present

## 2024-08-15 DIAGNOSIS — Z09 Encounter for follow-up examination after completed treatment for conditions other than malignant neoplasm: Secondary | ICD-10-CM | POA: Diagnosis not present

## 2024-08-22 DIAGNOSIS — I83892 Varicose veins of left lower extremities with other complications: Secondary | ICD-10-CM | POA: Diagnosis not present

## 2024-08-22 DIAGNOSIS — I87392 Chronic venous hypertension (idiopathic) with other complications of left lower extremity: Secondary | ICD-10-CM | POA: Diagnosis not present

## 2024-08-30 ENCOUNTER — Encounter: Payer: Self-pay | Admitting: Cardiology

## 2024-08-30 NOTE — Telephone Encounter (Signed)
 Left vm to return call.

## 2024-09-03 DIAGNOSIS — Z419 Encounter for procedure for purposes other than remedying health state, unspecified: Secondary | ICD-10-CM | POA: Diagnosis not present

## 2024-09-05 DIAGNOSIS — I83891 Varicose veins of right lower extremities with other complications: Secondary | ICD-10-CM | POA: Diagnosis not present

## 2024-09-05 DIAGNOSIS — I83811 Varicose veins of right lower extremities with pain: Secondary | ICD-10-CM | POA: Diagnosis not present

## 2024-09-07 DIAGNOSIS — G894 Chronic pain syndrome: Secondary | ICD-10-CM | POA: Diagnosis not present

## 2024-09-07 DIAGNOSIS — Z978 Presence of other specified devices: Secondary | ICD-10-CM | POA: Diagnosis not present

## 2024-09-07 DIAGNOSIS — M5440 Lumbago with sciatica, unspecified side: Secondary | ICD-10-CM | POA: Diagnosis not present

## 2024-09-08 DIAGNOSIS — I83812 Varicose veins of left lower extremities with pain: Secondary | ICD-10-CM | POA: Diagnosis not present

## 2024-09-08 DIAGNOSIS — M7989 Other specified soft tissue disorders: Secondary | ICD-10-CM | POA: Diagnosis not present

## 2024-09-08 DIAGNOSIS — I83892 Varicose veins of left lower extremities with other complications: Secondary | ICD-10-CM | POA: Diagnosis not present

## 2024-09-10 ENCOUNTER — Encounter: Payer: Self-pay | Admitting: Family Medicine

## 2024-09-11 MED ORDER — AMPHETAMINE-DEXTROAMPHETAMINE 30 MG PO TABS: 30.0000 mg | ORAL_TABLET | Freq: Two times a day (BID) | ORAL | 0 refills | Status: DC

## 2024-09-11 NOTE — Telephone Encounter (Signed)
 Done

## 2024-09-13 ENCOUNTER — Other Ambulatory Visit: Payer: Self-pay | Admitting: Family Medicine

## 2024-09-13 ENCOUNTER — Ambulatory Visit: Admitting: Cardiology

## 2024-09-15 MED ORDER — ALPRAZOLAM 1 MG PO TABS: 1.0000 mg | ORAL_TABLET | Freq: Three times a day (TID) | ORAL | 5 refills | Status: AC | PRN

## 2024-09-15 NOTE — Telephone Encounter (Signed)
 Done

## 2024-09-19 DIAGNOSIS — I83891 Varicose veins of right lower extremities with other complications: Secondary | ICD-10-CM | POA: Diagnosis not present

## 2024-09-19 DIAGNOSIS — I83811 Varicose veins of right lower extremities with pain: Secondary | ICD-10-CM | POA: Diagnosis not present

## 2024-09-19 DIAGNOSIS — H5213 Myopia, bilateral: Secondary | ICD-10-CM | POA: Diagnosis not present

## 2024-09-20 ENCOUNTER — Encounter (HOSPITAL_COMMUNITY): Payer: Self-pay

## 2024-09-20 ENCOUNTER — Ambulatory Visit (HOSPITAL_COMMUNITY): Admit: 2024-09-20 | Admitting: Gastroenterology

## 2024-09-20 SURGERY — MANOMETRY, ESOPHAGUS
Anesthesia: Choice

## 2024-10-10 ENCOUNTER — Other Ambulatory Visit: Payer: Self-pay

## 2024-10-10 ENCOUNTER — Encounter (HOSPITAL_BASED_OUTPATIENT_CLINIC_OR_DEPARTMENT_OTHER): Payer: Self-pay | Admitting: Emergency Medicine

## 2024-10-10 ENCOUNTER — Emergency Department (HOSPITAL_BASED_OUTPATIENT_CLINIC_OR_DEPARTMENT_OTHER)
Admission: EM | Admit: 2024-10-10 | Discharge: 2024-10-10 | Disposition: A | Discharge status: Home / Self Care | Attending: Emergency Medicine | Admitting: Emergency Medicine

## 2024-10-10 DIAGNOSIS — R197 Diarrhea, unspecified: Secondary | ICD-10-CM | POA: Insufficient documentation

## 2024-10-10 DIAGNOSIS — Z7982 Long term (current) use of aspirin: Secondary | ICD-10-CM | POA: Insufficient documentation

## 2024-10-10 DIAGNOSIS — R112 Nausea with vomiting, unspecified: Secondary | ICD-10-CM | POA: Diagnosis not present

## 2024-10-10 DIAGNOSIS — I2510 Atherosclerotic heart disease of native coronary artery without angina pectoris: Secondary | ICD-10-CM | POA: Insufficient documentation

## 2024-10-10 DIAGNOSIS — N189 Chronic kidney disease, unspecified: Secondary | ICD-10-CM | POA: Diagnosis not present

## 2024-10-10 LAB — COMPREHENSIVE METABOLIC PANEL WITH GFR
ALT: 24 U/L (ref 0–44)
AST: 37 U/L (ref 15–41)
Albumin: 4 g/dL (ref 3.5–5.0)
Alkaline Phosphatase: 81 U/L (ref 38–126)
Anion gap: 9 (ref 5–15)
BUN: 16 mg/dL (ref 6–20)
CO2: 25 mmol/L (ref 22–32)
Calcium: 9 mg/dL (ref 8.9–10.3)
Chloride: 105 mmol/L (ref 98–111)
Creatinine, Ser: 1.25 mg/dL — ABNORMAL HIGH (ref 0.61–1.24)
GFR, Estimated: >60 mL/min (ref >60)
Glucose, Bld: 134 mg/dL — ABNORMAL HIGH (ref 70–99)
Potassium: 4.6 mmol/L (ref 3.5–5.1)
Sodium: 139 mmol/L (ref 135–145)
Total Bilirubin: 0.4 mg/dL (ref 0.0–1.2)
Total Protein: 8.1 g/dL (ref 6.5–8.1)

## 2024-10-10 LAB — URINALYSIS, ROUTINE W REFLEX MICROSCOPIC
Bilirubin Urine: NEGATIVE
Glucose, UA: NEGATIVE mg/dL
Hgb urine dipstick: NEGATIVE
Ketones, ur: NEGATIVE mg/dL
Leukocytes,Ua: NEGATIVE
Nitrite: NEGATIVE
Protein, ur: NEGATIVE mg/dL
Specific Gravity, Urine: 1.01 (ref 1.005–1.030)
pH: 6 (ref 5.0–8.0)

## 2024-10-10 LAB — CBC
HCT: 43.9 % (ref 39.0–52.0)
Hemoglobin: 14.6 g/dL (ref 13.0–17.0)
MCH: 31.7 pg (ref 26.0–34.0)
MCHC: 33.3 g/dL (ref 30.0–36.0)
MCV: 95.4 fL (ref 80.0–100.0)
Platelets: 155 K/uL (ref 150–400)
RBC: 4.6 MIL/uL (ref 4.22–5.81)
RDW: 14.1 % (ref 11.5–15.5)
WBC: 5.2 K/uL (ref 4.0–10.5)
nRBC: 0 % (ref 0.0–0.2)

## 2024-10-10 LAB — LIPASE, BLOOD: Lipase: 41 U/L (ref 11–51)

## 2024-10-10 MED ORDER — ONDANSETRON 4 MG PO TBDP
4.0000 mg | ORAL_TABLET | Freq: Once | ORAL | Status: AC
  Administered 2024-10-10: 4 mg via ORAL
  Filled 2024-10-10: qty 1

## 2024-10-10 NOTE — Discharge Instructions (Signed)
 Today you were seen for nausea, vomiting, and diarrhea.  Your workup on the emergency department was reassuring.  Please follow-up with your PCP if your symptoms persist.  Thank you for letting us  treat you today. After reviewing your labs, I feel you are safe to go home. Please follow up with your PCP in the next several days and provide them with your records from this visit. Return to the Emergency Room if pain becomes severe or symptoms worsen.

## 2024-10-10 NOTE — ED Provider Notes (Signed)
 [place] [facility] Provider Note   CSN: [id number] Arrival date & time: 10/10/24  1704     Patient presents with: Emesis   [name] is a 53 y.o. male past medical history significant for CKD, CAD, anxiety, PE, asthma, and GI bleed presents today for nausea, vomiting, diarrhea x 6 days.  Patient last threw up this morning.  Patient denies abdominal pain, fever, chills, chest pain, shortness of breath, hematemesis, or blood in stool.     Emesis Associated symptoms: diarrhea        Prior to Admission medications   Medication Sig Start Date End Date Taking? Authorizing Provider  ALPRAZolam  (XANAX ) 1 MG tablet Take 1 tablet (1 mg total) by mouth 3 (three) times daily as needed for anxiety. 09/15/24   Johnny Garnette LABOR, MD  amphetamine -dextroamphetamine  (ADDERALL) 30 MG tablet Take 1 tablet by mouth 2 (two) times daily. 09/11/24   Johnny Garnette LABOR, MD  amphetamine -dextroamphetamine  (ADDERALL) 30 MG tablet Take 1 tablet by mouth 2 (two) times daily. 09/11/24   Johnny Garnette LABOR, MD  amphetamine -dextroamphetamine  (ADDERALL) 30 MG tablet Take 1 tablet by mouth 2 (two) times daily. 09/11/24   Johnny Garnette LABOR, MD  aspirin  EC 81 MG EC tablet Take 1 tablet (81 mg total) by mouth daily. Swallow whole. 10/16/21   Ingold, Laura R, NP  budesonide (PULMICORT) 1 MG/2ML nebulizer solution Place 0.6 mg into the nose 2 (two) times daily. To be placed in nasal saline irrigation to be done twice a day 09/15/23   [provider]  famotidine  (PEPCID ) 40 MG tablet Take 1 tablet (40 mg total) by mouth daily. In the evening 12/08/23   Esterwood, Amy S, PA-C  fluconazole  (DIFLUCAN ) 150 MG tablet Take 1 tablet (150 mg total) by mouth as needed. 05/18/23   Johnny Garnette LABOR, MD  HYDROmorphone  HCl (DILAUDID  PO) Take by mouth. PUMP    [provider]  mirtazapine (REMERON) 15 MG tablet Take 1 tablet by mouth at bedtime. 07/21/23   [provider]   nitroGLYCERIN  (NITROSTAT ) 0.4 MG SL tablet Place 1 tablet (0.4 mg total) under the tongue every 5 (five) minutes as needed for chest pain. 01/12/24   Revankar, Jennifer SAUNDERS, MD  omeprazole  (PRILOSEC) 40 MG capsule Take 1 capsule (40 mg total) by mouth 2 (two) times daily. 08/03/22   Cirigliano, Vito V, DO  omeprazole  (PRILOSEC) 40 MG capsule Take 1 capsule (40 mg total) by mouth 2 (two) times daily. 12/08/23   Esterwood, Amy S, PA-C  ondansetron  (ZOFRAN ) 4 MG tablet Take 1 tablet (4 mg total) by mouth every 8 (eight) hours as needed for nausea or vomiting. 08/23/23   Johnny Garnette LABOR, MD  tadalafil  (CIALIS ) 20 MG tablet Take 1 tablet (20 mg total) by mouth daily as needed. 04/01/23   Johnny Garnette LABOR, MD  testosterone  enanthate (DELATESTRYL) 200 MG/ML injection SMARTSIG:0.5 Milliliter(s) IM Once a Week 08/25/22   [provider]    Allergies: Armoracia rusticana ext (horseradish), Other, Morphine and codeine, Trimethoprim, Rocephin  [ceftriaxone  sodium in dextrose ], and Sulfamethoxazole    Review of Systems  Gastrointestinal:  Positive for diarrhea, nausea and vomiting.    Updated Vital Signs BP (!) 152/93 (BP Location: Right Arm)   Pulse 82   Temp 97.8 F (36.6 C)   Resp 20   Wt 77.1 kg   SpO2 100%   BMI 27.44 kg/m   Physical Exam Vitals and nursing note reviewed.  Constitutional:  General: He is not in acute distress.    Appearance: Normal appearance. He is well-developed. He is not diaphoretic.  HENT:     Head: Normocephalic and atraumatic.     Right Ear: External ear normal.     Left Ear: External ear normal.     Nose: Nose normal.     Mouth/Throat:     Mouth: Mucous membranes are moist.     Pharynx: Oropharynx is clear.  Eyes:     Extraocular Movements: Extraocular movements intact.     Conjunctiva/sclera: Conjunctivae normal.  Cardiovascular:     Rate and Rhythm: Normal rate and regular rhythm.     Pulses: Normal pulses.     Heart sounds: Normal heart sounds. No murmur  heard. Pulmonary:     Effort: Pulmonary effort is normal. No respiratory distress.     Breath sounds: Normal breath sounds.  Abdominal:     General: There is no distension.     Palpations: Abdomen is soft.     Tenderness: There is no abdominal tenderness.  Musculoskeletal:        General: No swelling.     Cervical back: Neck supple.  Skin:    General: Skin is warm and dry.     Capillary Refill: Capillary refill takes less than 2 seconds.  Neurological:     General: No focal deficit present.     Mental Status: He is alert and oriented to person, place, and time.  Psychiatric:        Mood and Affect: Mood normal.     (all labs ordered are listed, but only abnormal results are displayed) Labs Reviewed  COMPREHENSIVE METABOLIC PANEL WITH GFR - Abnormal; Notable for the following components:      Result Value   Glucose, Bld 134 (*)    Creatinine, Ser 1.25 (*)    All other components within normal limits  LIPASE, BLOOD  CBC  URINALYSIS, ROUTINE W REFLEX MICROSCOPIC    EKG: None  Radiology: No results found.   Procedures   Medications Ordered in the ED  ondansetron  (ZOFRAN -ODT) disintegrating tablet 4 mg (4 mg Oral Given 10/10/24 1931)                                    Medical Decision Making Amount and/or Complexity of Data Reviewed Labs: ordered.  Risk Prescription drug management.   This patient presents to the ED for concern of N/V/D differential diagnosis includes viral GI illness, opioid withdrawal, COVID, flu, RSV    Additional history obtained   Additional history obtained from Electronic Medical Record External records from outside source obtained and reviewed including pain management and Care Everywhere   Lab Tests:  I Ordered, and personally interpreted labs.  The pertinent results include: Mildly elevated creatinine at 1.25 from a baseline of approximately 1.05, CBC unremarkable, lipase 41, UA unremarkable   Medicines ordered and  prescription drug management:  I ordered medication including zofran     I have reviewed the patients home medicines and have made adjustments as needed   Problem List / ED Course:  Patient states that they are feeling much better after Zofran  and have had no episodes of nausea, vomiting, or diarrhea in the approximately 3 hours that they have been in the emergency department.  Patient has Zofran  at home prescribed to him by pain management.  Patient to follow-up with primary care if his symptoms persist.  I feel patient is safe for discharge at this time.     Final diagnoses:  Nausea vomiting and diarrhea    ED Discharge Orders     None          Francis Ileana SAILOR, PA-C 10/10/24 1950    Lenor Hollering, MD 10/10/24 [phone]

## 2024-10-10 NOTE — ED Notes (Signed)
 Pt given cup of water

## 2024-10-10 NOTE — ED Triage Notes (Signed)
 Pt states Im severely hydrated. Endorses n/v/d x 6 days. Reports withdrawing from dilaudid . Last emesis this am. Zofran  at 11am

## 2024-10-10 NOTE — ED Notes (Signed)
 Pt states that he feels better and he is ready to go. Provider aware. Pt does not want to wait for discharge vitals

## 2024-10-11 ENCOUNTER — Encounter: Payer: Self-pay | Admitting: Family Medicine

## 2024-10-25 ENCOUNTER — Ambulatory Visit: Payer: Self-pay

## 2024-10-25 NOTE — Telephone Encounter (Signed)
 FYI Only or Action Required?: FYI only for provider: appointment scheduled on 10/26/2024.  Patient was last seen in primary care on 02/16/2024 by Jordan, Betty G, MD.  Called Nurse Triage reporting Fever.  Symptoms began a week ago.  Interventions attempted: Prescription medications: tylenol  and ibuprofen.  Symptoms are: unchanged.  Triage Disposition: See Physician Within 24 Hours  Patient/caregiver understands and will follow disposition?: Yes  Copied from CRM #[id number]. Topic: Clinical - Red Word Triage >> Oct 25, 2024 10:17 AM Rea ORN wrote: Red Word that prompted transfer to Nurse Triage: Tightness in chest, fever for over a week. Some chest congestion.  Fatigue  Neg flu and covid Reason for Disposition  Fever present > 3 days (72 hours)  Answer Assessment - Initial Assessment Questions Chest tightness for 3 days, worse today. Took tylenol  and ibuprofen this morning, very mild improvement. Has not taken temp today. SOB with activity. Last tempature take last night 100.8. Patient told he can go to UC to get treated sooner. Patient wants first available with Dr Johnny. Mildly productive cough. States cough feels tight.  1. LOCATION: Where does it hurt?      chest 2. ONSET: When did the sinus pain start?  (e.g., hours, days)     A week ago 4. RECURRENT SYMPTOM: Have you ever had sinus problems before? If Yes, ask: When was the last time? and What happened that time?      Has had bronchitis and pneunomia. 7. FEVER: Do you have a fever? If Yes, ask: What is it, how was it measured, and when did it start?      1 week. HX coronary bypass. States this pain does not feel like that, it feels like congestion.  Protocols used: Sinus Pain or Congestion-A-AH

## 2024-10-26 ENCOUNTER — Ambulatory Visit: Admitting: Family Medicine

## 2024-10-26 NOTE — Telephone Encounter (Signed)
 Pt cancelled his appointment with Dr Johnny for today

## 2024-11-02 ENCOUNTER — Other Ambulatory Visit: Payer: Self-pay | Admitting: Family Medicine

## 2024-11-10 DIAGNOSIS — K449 Diaphragmatic hernia without obstruction or gangrene: Secondary | ICD-10-CM | POA: Diagnosis not present

## 2024-11-10 DIAGNOSIS — G4733 Obstructive sleep apnea (adult) (pediatric): Secondary | ICD-10-CM | POA: Diagnosis not present

## 2024-11-10 DIAGNOSIS — Z951 Presence of aortocoronary bypass graft: Secondary | ICD-10-CM | POA: Diagnosis not present

## 2024-11-10 DIAGNOSIS — I2695 Cement embolism of pulmonary artery without acute cor pulmonale: Secondary | ICD-10-CM | POA: Diagnosis not present

## 2024-11-10 DIAGNOSIS — Q600 Renal agenesis, unilateral: Secondary | ICD-10-CM | POA: Diagnosis not present

## 2024-11-10 DIAGNOSIS — T81718A Complication of other artery following a procedure, not elsewhere classified, initial encounter: Secondary | ICD-10-CM | POA: Diagnosis not present

## 2024-11-10 DIAGNOSIS — J849 Interstitial pulmonary disease, unspecified: Secondary | ICD-10-CM | POA: Diagnosis not present

## 2024-11-10 DIAGNOSIS — M3213 Lung involvement in systemic lupus erythematosus: Secondary | ICD-10-CM | POA: Diagnosis not present

## 2024-11-10 DIAGNOSIS — I25110 Atherosclerotic heart disease of native coronary artery with unstable angina pectoris: Secondary | ICD-10-CM | POA: Diagnosis not present

## 2024-11-10 DIAGNOSIS — K219 Gastro-esophageal reflux disease without esophagitis: Secondary | ICD-10-CM | POA: Diagnosis not present

## 2024-11-20 NOTE — ED Provider Notes (Signed)
 [facility] LICAJ, PA-C  [facility] ED [address] [place] [address]  KYRIAKOS BABLER   1971-08-30 (53 y.o.)    Chief Complaint: Cough, Shortness of Breath (Pt thinks he may have pneumonia), and Chest Pain (X1 week)  History of Present Illness: HPI  52 year old male presented to the Emergency Department today complains of cough and wheezing.  This is ongoing for about a week.  Patient states that cough has been giving him some chest pain.  He does not have any dyspnea exertion or shortness of breath but states that more he coughs more he has had chest wall pain.  He has had no other associated symptoms denies headache bruising dizzy nausea vomiting abdominal pain.  Patient states that he has a known history of a interstitial lung disease and he does not use any inhaler for that. He has had no swelling or claudication or any other associated symptoms.   ROS  I have reviewed Chief Complaint and Documentation Note obtained by Nurse/Ancillary staff. I have made changes as needed and/or otherwise agree with documented information.   CONSTITUTIONAL: (-) Chills,(-) Fever, (-) Night Sweats,(-) Weight Loss See HPI Additional ROS info: All other Organ Systems Reviewed and negative except as indicated in HPI.                 Medical and Surgical History: Billyjoe has a past medical history of GERD (gastroesophageal reflux disease), Interstitial lung disease (HCC), and Lupus (systemic lupus erythematosus) (HCC). Saron has a past surgical history that includes Cardiac surgery.    ED Triage Vital Signs: Temp: 36.8 C (98.3 F), Heart Rate: 100, Resp: 18, BP: 137/74, SpO2: 98 %, Temp Source: Oral, Heart Rate Source: Monitor, BP Location: Left arm  Physical Exam     CONSTITUTIONAL:  Alert, well nourished, NAD, non toxic non lethargic, clear voice - fluent speech in full sentences in no distress.     HEAD:  NCAT     EYES:   EOMI, PERRL, no Exophthalmia, no proptosis.     NECK:  No anterior or posterior cervical adenopathy. No nuchal rigidity - no meningismus, no midline ttp, no deformity, no mastoiditis     ENT:  Mucous membranes  moist, oropharynx clear,no swelling and uvula is midline.     CARDIOVASCULAR:   RRR ,  No Edema ,  normal S1, S2     LUNGS:   CTA B/L ,  no stridor     GASTROINTESTINAL:  Soft,  nontender , non-distended, Normoactive BS, no CVA ttp Bil.      GU:  NA      MUSCULOSKELETAL:  Distal pulses normal, no injury or deformity, good AROM/PROM and Strength in all 4 ext.  No Ext Swelling and pt ambulatory with steady gait w/o assistance.      NEUROLOGIC:   Strength 5 out of 5 throughout, no focal deficits, sensation intact -throughout, Glasgow Coma Scale is 15.      SKIN:   Warm, dry, no rash      LYMPHATIC:  No lymphadenopathy      PSYCHIATRIC:  Normal affect, cooperative Procedures ED Course as of 11/20/24 1756  Mon Nov 20, 2024  1558 Patient EKG taken on 11/21/2023 at 12:55 PM, Ventricular rate 101 bpm, PR interval 146 ms, QRS duration 72 ms, As interpreted by the ED physician, patient findings are consistent with a sinus tachycardia 1 1 bpm otherwise no STEMI, normal EKG [SL]  1723  After breathing treatment received in the ED patient was reevaluated and stated that he feels much better and feeling that the breathing treatment helped him with his cough and helped a lot. [SL]    ED Course User Index [SL] Licaj, Skerdilaid, PA-C      Diagnoses as of 11/20/24 1756  Wheezing  History of interstitial lung disease   Medical Decision Making Amount and/or Complexity of Data Reviewed Labs: ordered. ECG/medicine tests: ordered.  Risk Prescription drug management.  Patient has tolerated very well ED course. Patent vital signs have maintained stable, patient is nontoxic, non-lethargic and patient speaking with clear voice and fluent speech.   Pt's ED presenting symptoms have  been improved during ED course.  Patient labs/Imaging report (s) have been reviewed and discussed.  Patient improved with ED course.  He received breathing treatment and corticosteroids.  His labs are otherwise benign.  Patient's ACS evaluation is screening due to the patient medical history was performed today and is benign.  Patient does not have any chest pain during ED course or before ED arrival but has had chest wall pain due to his cough. Patient after nebulizer treatment feels much better.  He has not had any medications for his interstitial lung disease and for this is discussed today with him for follow-up with outpatient management and his primary care doctor.  He was placed on corticosteroid Dosepak as well as albuterol  inhaler.  Labs otherwise benign without pneumonia his chest x-ray is benign.  Reevaluation post nebulizer treatment patient without any wheezing or cough or any abnormal breathing sounds were.  He is speaking clearly and fluently and  without any abnormal breathing sounds or any abnormal breathing patterns.  His pleasant and polite he understand instructions agrees to the plan of care with strict ED return precautions.  Medical decision making was guided by information noted in the patient's presenting complaint, prior history,  physical exam, and or laboratory work and or imaging findings.  I have discussed today's findings and outpatient follow up at length with Patient/Family and specifically have advised and discussed strict and detailed ED return precautions - to return immediately if worsening of current symptoms or new symptoms develop prior to outpatient follow - up, as symptoms and disease process can worsen despite a reassuring work here in the ED.  Patient/family agrees to return if symptom worsens.  Patient advised to return if unable to follow-up with own primary care/specialist physician if his condition does not improve. Prescription (s) provided and their side  effects have been thoroughly discussed with patient/family. Patient/Family is/are aware of findings, understand the instructions and is/are agreeable to the plan of care.  Vitals:   11/20/24 1630  BP: 139/74  Pulse: 96  Resp: 22  Temp:   SpO2: 96%    Medications  albuterol  2.5 mg /3 mL (0.083 %) nebulizer solution 5 mg (5 mg nebulization Given 11/20/24 1614)  dexAMETHasone  (PF) (Decadron ) injection 10 mg (10 mg intravenous Given 11/20/24 1757)   XR chest 2 views  Final Result  Chronic coarsened interstitial changes in the lungs with patchy  bibasilar reticulations. Findings could reflect chronic fibrotic  changes in the lungs although a superimposed acute infectious or  inflammatory process cannot be excluded.    SIGNATURE:    Electronically Signed    By: Jamie Doster M.D.    On: 11/20/2024 13:53        WSID: [id number]       Labs Reviewed  COMPREHENSIVE METABOLIC PANEL - Abnormal  Result Value   Sodium 138     Potassium 4.5     Chloride 105     CO2 30     BUN 21 (*)    Glucose 94     Creatinine 1.13     Calcium  8.9     Anion Gap 3     Osmolality Calc 289     BUN/Creatinine Ratio 18.6     Total Bilirubin 0.5     Aspartate Aminotransferase (AST) 37     Alanine Aminotransferase (ALT) 21     Total Protein 8.3 (*)    Albumin Level 4.1     Alkaline Phosphatase 94     eGFR 77    URINALYSIS W REFLEX CULTURE IF INDICATED - Abnormal   Urine Color Light Yellow (*)    Urine Appearance Clear     Urine Specific Gravity 1.012     Urine pH 6.5     Urine Leukocyte Esterase Negative     Urine Nitrite Negative     Urine Protein Negative     Urine Glucose Normal     Urine Ketones Negative     Urine Bilirubin Negative     Urine Urobilinogen Normal     Urine Blood Negative     Reflex UA Culture Not Indicated    CBC WITH DIFFERENTIAL - Abnormal   WBC Count 5.55     RBC 4.36 (*)    Hemoglobin 14.0     Hematocrit 42.3     MCV 97.0 (*)    MCH 32.1     MCHC 33.1      RDW 14.8 (*)    Platelet Count 147 (*)    Nucleated RBC % Auto 0.0     NRBC Absolute 0.00     Neutrophil % 62.6     Lymphocytes % 21.6 (*)    Monocytes % 11.9     Eosinophils % 3.2     Basophils % 0.2     IG Auto 0.5     Neutrophils Absolute 3.47     Lymphocytes Absolute 1.20     Monocytes Absolute 0.66     Eosinophils Absolute 0.18     Basophils Absolute 0.01     IG Absolute 0.03    SARS-COV-2/FLU/RSV - Normal   SARS-CoV-2, PCR Negative     Influenza A, PCR Negative     Influenza B, PCR Negative     RSV, PCR Negative    MAGNESIUM LEVEL - Normal   Magnesium 1.8    CBC AND DIFFERENTIAL   Narrative:    The following orders were created for panel order CBC and differential. Procedure                               Abnormality         Status                    ---------                               -----------         ------                    CBC w/ differential[[id number]]          Abnormal            Final  result               Please view results for these tests on the individual orders.  TROPONIN-I   Troponin-I <0.012     Troponin-I Interpretation       Value: No biomarker evidence of myocardial injury is detected.     Disposition: DISCHARGE  ED Prescriptions     Medication Sig Dispense Start Date End Date Auth. Provider   albuterol  90 mcg/actuation inhaler Inhale 1-2 puffs every 6 (six) hours if needed for wheezing or shortness of breath. 18 g 11/20/2024 12/20/2024 Licaj, Skerdilaid, PA-C   methylPREDNISolone (Medrol Dospak) 4 mg tablets Follow schedule on package instructions 21 tablet 11/20/2024 -- Licaj, Skerdilaid, PA-C      Attending Dr. Sarna       Licaj, Skerdilaid, PA-C 11/20/24 1759

## 2024-11-26 ENCOUNTER — Telehealth: Payer: Self-pay | Admitting: Pulmonary Disease

## 2024-11-26 MED ORDER — ATOVAQUONE 750 MG/5ML PO SUSP: 750.0000 mg | Freq: Every day | ORAL | 0 refills | Status: DC

## 2024-11-26 MED ORDER — PREDNISONE 10 MG PO TABS: ORAL_TABLET | ORAL | 0 refills | Status: DC

## 2024-11-26 NOTE — Telephone Encounter (Signed)
 Patient with known SLE associated ILD who went on vacation and developed SOB and chest pain with negative workup in [facility] that was prescribed prednisone  taper. He is expressing return of diffuse chest pain with SOB as his steroid taper is coming down. No cough, hemoptysis, or dyspnea at rest. No known ill contacts. No fevers, chills, nausea/vomiting. +DOE  - prednisone  taper, start atovaquone  for prolonged steroids (bactrim allergy) - Recommend urgent clinic eval with available provider to reassess acute illness vs ILD exacerbation.  -- Ria Hemming, MD [facility]

## 2024-11-27 NOTE — Telephone Encounter (Signed)
Called patient. Left voicemail and sent mychart message.

## 2024-11-27 NOTE — Telephone Encounter (Signed)
 Called the pt x3 and there was no answer- left message to call and schedule appt.  Closing per protocol

## 2024-11-27 NOTE — Telephone Encounter (Signed)
 Fomer Sood patient.  Did not show for Sept 2025 transfer of care visit with me. Now sick.    Plan  - app or Dr Geronimo this week0 - acute - then spiro/dlco and Dr Geronimo 30 min <2 months (there should be an older order)      Latest Ref Rng & Units 01/10/2024    4:47 PM 11/29/2019   12:07 PM  PFT Results  FVC-Pre L 2.52  2.37   FVC-Predicted Pre % 58  53   FVC-Post L 2.52  2.36   FVC-Predicted Post % 58  53   Pre FEV1/FVC % % 80  82   Post FEV1/FCV % % 83  87   FEV1-Pre L 2.01  1.95   FEV1-Predicted Pre % 60  56   FEV1-Post L 2.10  2.06   DLCO uncorrected ml/min/mmHg 16.38  20.77   DLCO UNC% % 64  79   DLCO corrected ml/min/mmHg 16.88    DLCO COR %Predicted % 66    DLVA Predicted % 112  135   TLC L 3.05  3.55   TLC % Predicted % 49  57   RV % Predicted % 12  57

## 2024-11-27 NOTE — Telephone Encounter (Signed)
Tried calling the pt and there was no answer- LMTCB.  

## 2024-11-29 ENCOUNTER — Telehealth: Payer: Self-pay

## 2024-11-29 ENCOUNTER — Ambulatory Visit: Payer: Self-pay | Admitting: Internal Medicine

## 2024-11-29 DIAGNOSIS — J849 Interstitial pulmonary disease, unspecified: Secondary | ICD-10-CM

## 2024-11-29 MED ORDER — PREDNISONE 10 MG PO TABS: ORAL_TABLET | ORAL | 0 refills | Status: DC

## 2024-11-29 NOTE — Telephone Encounter (Signed)
 predniSONE  (DELTASONE ) 10 MG tablet 55 tablet 0 11/26/2024 12/16/2024   Sig - Route: Take 4 tablets (40 mg total) by mouth daily for 5 days, THEN 3 tablets (30 mg total) daily for 5 days, THEN 2 tablets (20 mg total) daily for 10 days. - Oral   Class: Print    Pharmacy  [id number] - [place], [place] - [address]    Dr. Geronimo, Is there a note to go with this prescription?  I see no note in response to the message.  Please advise.  Thank you.

## 2024-11-29 NOTE — Telephone Encounter (Signed)
 Original script was not sent due number of characters on script it was printed. Corrected script and resent to [facility] per patient request. Called patient and notified also asked to d/c 4 mg while on 40 mg taper. Patient verbalized understanding nothing further needed.  Copied from CRM [id number]. Topic: Clinical - Prescription Issue >> Nov 28, 2024 10:20 AM [name] wrote: Reason for CRM: Pt calling in regards to prednisone , pt stated he was prescribed prednisone  and was prescribed a 4mg  tablet, pt stated he has ILD and he needs a 40mg  prednisone , pt stated he spoke with after hours and that is where he received the medication.

## 2024-11-29 NOTE — Telephone Encounter (Signed)
 Duplicate patient states he just needs his 40mg  prednisone  sent to pharmacy. This has been addressed in separate encounter.  Copied from CRM [id number]. Topic: Clinical - Prescription Issue >> Nov 28, 2024  4:18 PM Joesph PARAS wrote: Reason for CRM: Patient is calling to state that despite being prescribed 10mg  Deltasone  as listed in chart, [facility] filled him a 21 pill 4mg  dose pack instead of the written prescription of 55 pill 10mg  DELTASONE . Patient did have RX transferred to a different [facility] location before filling. Patient would like us  to resend the correct prescription for fill.  Patient has taken two full days of the dose pack and three more this morning. Patient states there is no relief or impact of the deltasone  on current symptoms. States will take more of the 4mg  when he gets home until he hears from us .

## 2024-11-29 NOTE — Telephone Encounter (Signed)
 FYI Only or Action Required?: Action required by provider: medication refill request and update on patient condition.  Patient is followed in Pulmonology for history of asthma, last seen on 01/10/2024 by Geronimo Amel, MD.  Called Nurse Triage reporting Breathing Problem and Fatigue.  Symptoms began several weeks ago.  Interventions attempted: Prescription medications: Prednisone  and Rescue inhaler.  Symptoms are: gradually worsening.  Triage Disposition: See HCP Within 4 Hours (Or PCP Triage)- await office response  Patient/caregiver understands and will follow disposition?: Yes   E2C2 Pulmonary Triage - Initial Assessment Questions Chief Complaint (e.g., cough, sob, wheezing, fever, chills, sweat or additional symptoms) *Go to specific symptom protocol after initial questions. Worsening breathing trouble, wheezing.  Needs a different dose of Prednisone  than what was prescribed.   How long have symptoms been present? Several weeks  Have you tested for COVID or Flu? Note: If not, ask patient if a home test can be taken. If so, instruct patient to call back for positive results. Yes  MEDICINES:     Have you used your inhalers/maintenance medication? Yes If yes, What medications? inhaler   Called [email or web address] , no answer.   Sent message to Pulmonary Nurse Triage: For Pulmonary Care at [facility]:  CAL not answering, please see triage note for MRN [id number], patient needing a different dose of Prednisone  than what was prescribed.  Symptoms worsening and relates to having wrong medication dose, see patient's messages in MyChart.  High-priority.     Copied from CRM [id number]. Topic: Clinical - Red Word Triage >> Nov 29, 2024  2:59 PM Isabell A wrote: Red Word that prompted transfer to Nurse Triage: Difficult time breathing, fatigue  - patient has an issue with the prescription that was sent in for Prednisone .    Reason for Disposition  [1] MILD difficulty breathing  (e.g., minimal/no SOB at rest, SOB with walking, pulse < 100) AND [2] NEW-onset or WORSE than normal  Answer Assessment - Initial Assessment Questions Seen in ED, diagnosed with interstitial disease/ inflammation per patient, blood work and chest x-ray ruled out infection.  Going to see pulmonary 2/4- long volume test (PFT) and appt planned.  Ongoing cough.  See MyChart messages from patient with photos of medication.  Patient reports he needs the 10 mg tablets of Prednisone .    RESPIRATORY STATUS: Describe your breathing? (e.g., wheezing, shortness of breath, unable to speak, severe coughing)      Having some wheezing, worsening trouble breathing relates to not having needed medication dose.   ONSET: When did this breathing problem begin?      After Christmas- had cold/virus, fevers in family, then had cough (nagging), other symptoms resolved.  Had prednisone  in ED.    OTHER SYMPTOMS: Do you have any other symptoms? (e.g., chest pain, cough, dizziness, fever, runny nose)     Inflammation pain, not heart chest pain, did EKG was okay;  denies dizzy or light-headed.  Was given albuterol , not helping. Has had ongoing wheezing, getting worse. Ongoing fatigue.  Answer Assessment - Initial Assessment Questions See alternative assessment. Ongoing fatigue related to respiratory condition.  Protocols used: Weakness (Generalized) and Fatigue-A-AH, Breathing Difficulty-A-AH

## 2024-11-29 NOTE — Telephone Encounter (Signed)
 Called patient to schedule 1 year follow up, and he stated he no longer will be seeing Dr San at [facility].  He stated he is now seeking GI care at [facility].

## 2024-11-30 NOTE — Telephone Encounter (Signed)
 I called and spoke with patient, he was given medrol dose pack instead of prednisone  for his taper.  He says they got it squared away yesterday and he has picked it up.  I advised him to let me know if he needs anything further.  He verbalized understanding.  Nothing further needed.

## 2024-11-30 NOTE — Telephone Encounter (Signed)
" °  He is a former patient of Dr. Shellia.  I only saw him 1 time.  On November 26, 2024 the night service electronic ICU [place] called in the prednisone .  So I have no idea what they called in.  This is because the exact taper is not documented on the note.  I also do not understand the message therefore please do tell me what exactly he wants and we can try to send in the prescription accordingly "

## 2024-12-12 ENCOUNTER — Encounter: Payer: Self-pay | Admitting: Family Medicine

## 2024-12-14 DIAGNOSIS — G4733 Obstructive sleep apnea (adult) (pediatric): Secondary | ICD-10-CM

## 2024-12-18 ENCOUNTER — Emergency Department (HOSPITAL_BASED_OUTPATIENT_CLINIC_OR_DEPARTMENT_OTHER)
Admission: EM | Admit: 2024-12-18 | Discharge: 2024-12-18 | Disposition: A | Discharge status: Home / Self Care | Attending: Emergency Medicine | Admitting: Emergency Medicine

## 2024-12-18 ENCOUNTER — Ambulatory Visit: Admitting: Family Medicine

## 2024-12-18 ENCOUNTER — Other Ambulatory Visit: Payer: Self-pay

## 2024-12-18 ENCOUNTER — Encounter (HOSPITAL_BASED_OUTPATIENT_CLINIC_OR_DEPARTMENT_OTHER): Payer: Self-pay

## 2024-12-18 ENCOUNTER — Emergency Department (HOSPITAL_BASED_OUTPATIENT_CLINIC_OR_DEPARTMENT_OTHER)

## 2024-12-18 DIAGNOSIS — N1330 Unspecified hydronephrosis: Secondary | ICD-10-CM | POA: Insufficient documentation

## 2024-12-18 DIAGNOSIS — Z7982 Long term (current) use of aspirin: Secondary | ICD-10-CM | POA: Insufficient documentation

## 2024-12-18 DIAGNOSIS — K529 Noninfective gastroenteritis and colitis, unspecified: Secondary | ICD-10-CM | POA: Insufficient documentation

## 2024-12-18 DIAGNOSIS — R109 Unspecified abdominal pain: Secondary | ICD-10-CM

## 2024-12-18 LAB — COMPREHENSIVE METABOLIC PANEL WITH GFR
ALT: 19 U/L (ref 0–44)
AST: 33 U/L (ref 15–41)
Albumin: 4.2 g/dL (ref 3.5–5.0)
Alkaline Phosphatase: 86 U/L (ref 38–126)
Anion gap: 9 (ref 5–15)
BUN: 17 mg/dL (ref 6–20)
CO2: 29 mmol/L (ref 22–32)
Calcium: 9.4 mg/dL (ref 8.9–10.3)
Chloride: 101 mmol/L (ref 98–111)
Creatinine, Ser: 1.12 mg/dL (ref 0.61–1.24)
GFR, Estimated: >60 mL/min
Glucose, Bld: 106 mg/dL — ABNORMAL HIGH (ref 70–99)
Potassium: 5 mmol/L (ref 3.5–5.1)
Sodium: 138 mmol/L (ref 135–145)
Total Bilirubin: 0.6 mg/dL (ref 0.0–1.2)
Total Protein: 8.7 g/dL — ABNORMAL HIGH (ref 6.5–8.1)

## 2024-12-18 LAB — URINALYSIS, ROUTINE W REFLEX MICROSCOPIC
Bilirubin Urine: NEGATIVE
Glucose, UA: NEGATIVE mg/dL
Ketones, ur: NEGATIVE mg/dL
Leukocytes,Ua: NEGATIVE
Nitrite: NEGATIVE
Protein, ur: 30 mg/dL — AB
Specific Gravity, Urine: 1.015 (ref 1.005–1.030)
pH: 5.5 (ref 5.0–8.0)

## 2024-12-18 LAB — GASTROINTESTINAL PANEL BY PCR, STOOL (REPLACES STOOL CULTURE)

## 2024-12-18 LAB — CBC
HCT: 52.2 % — ABNORMAL HIGH (ref 39.0–52.0)
Hemoglobin: 17.3 g/dL — ABNORMAL HIGH (ref 13.0–17.0)
MCH: 33.4 pg (ref 26.0–34.0)
MCHC: 33.1 g/dL (ref 30.0–36.0)
MCV: 100.8 fL — ABNORMAL HIGH (ref 80.0–100.0)
Platelets: 182 10*3/uL (ref 150–400)
RBC: 5.18 MIL/uL (ref 4.22–5.81)
RDW: 15.8 % — ABNORMAL HIGH (ref 11.5–15.5)
WBC: 6.6 10*3/uL (ref 4.0–10.5)
nRBC: 0 % (ref 0.0–0.2)

## 2024-12-18 LAB — RESP PANEL BY RT-PCR (RSV, FLU A&B, COVID)  RVPGX2
Influenza A by PCR: NEGATIVE
Influenza B by PCR: NEGATIVE
Resp Syncytial Virus by PCR: NEGATIVE
SARS Coronavirus 2 by RT PCR: NEGATIVE

## 2024-12-18 LAB — URINALYSIS, MICROSCOPIC (REFLEX)

## 2024-12-18 LAB — LIPASE, BLOOD: Lipase: 47 U/L (ref 11–51)

## 2024-12-18 MED ORDER — HYDROMORPHONE HCL 1 MG/ML IJ SOLN
0.5000 mg | Freq: Once | INTRAMUSCULAR | Status: AC
  Administered 2024-12-18: 0.5 mg via INTRAVENOUS
  Filled 2024-12-18: qty 1

## 2024-12-18 MED ORDER — SODIUM CHLORIDE 0.9 % IV BOLUS
500.0000 mL | Freq: Once | INTRAVENOUS | Status: AC
  Administered 2024-12-18: 500 mL via INTRAVENOUS

## 2024-12-18 MED ORDER — IOHEXOL 300 MG/ML  SOLN
100.0000 mL | Freq: Once | INTRAMUSCULAR | Status: AC | PRN
  Administered 2024-12-18: 100 mL via INTRAVENOUS

## 2024-12-18 NOTE — ED Provider Notes (Signed)
 " [facility] Provider Note   CSN: [id number] Arrival date & time: 12/18/24  1032     Patient presents with: Abdominal Pain   [name] is a 54 y.o. male.   Patient presents with generalized abdominal pain gradually worsening over the past week.  Most significant left lower quadrant.  No chest pain or shortness of breath.  No nausea diarrhea or vomiting no significant cough or shortness of breath or fevers.  This is different than previous.  Patient used to have a pain pump with Dilaudid  however plans to have it removed it is currently empty in the right lower quadrant.  No history of bowel obstruction or abdominal surgeries.  Nonradiating.  No urinary symptoms.  Mild worse after eating or drinking.  The history is provided by the patient.  Abdominal Pain Associated symptoms: nausea   Associated symptoms: no chest pain, no chills, no dysuria, no fever, no shortness of breath and no vomiting        Prior to Admission medications  Medication Sig Start Date End Date Taking? Authorizing Provider  ALPRAZolam  (XANAX ) 1 MG tablet Take 1 tablet (1 mg total) by mouth 3 (three) times daily as needed for anxiety. 09/15/24   Johnny Garnette LABOR, MD  amphetamine -dextroamphetamine  (ADDERALL) 30 MG tablet Take 1 tablet by mouth 2 (two) times daily. 09/11/24   Johnny Garnette LABOR, MD  amphetamine -dextroamphetamine  (ADDERALL) 30 MG tablet Take 1 tablet by mouth 2 (two) times daily. 09/11/24   Johnny Garnette LABOR, MD  amphetamine -dextroamphetamine  (ADDERALL) 30 MG tablet Take 1 tablet by mouth 2 (two) times daily. 09/11/24   Johnny Garnette LABOR, MD  aspirin  EC 81 MG EC tablet Take 1 tablet (81 mg total) by mouth daily. Swallow whole. 10/16/21   Marylu Leita SAUNDERS, NP  atovaquone  (MEPRON ) 750 MG/5ML suspension Take 5 mLs (750 mg total) by mouth daily with breakfast. 11/26/24   Geronimo Amel, MD  budesonide (PULMICORT) 1 MG/2ML nebulizer solution Place 0.6 mg into the nose 2  (two) times daily. To be placed in nasal saline irrigation to be done twice a day 09/15/23   [provider]  famotidine  (PEPCID ) 40 MG tablet Take 1 tablet (40 mg total) by mouth daily. In the evening 12/08/23   Esterwood, Amy S, PA-C  fluconazole  (DIFLUCAN ) 150 MG tablet Take 1 tablet (150 mg total) by mouth as needed. 05/18/23   Johnny Garnette LABOR, MD  HYDROmorphone  HCl (DILAUDID  PO) Take by mouth. PUMP    [provider]  mirtazapine (REMERON) 15 MG tablet Take 1 tablet by mouth at bedtime. 07/21/23   [provider]  nitroGLYCERIN  (NITROSTAT ) 0.4 MG SL tablet Place 1 tablet (0.4 mg total) under the tongue every 5 (five) minutes as needed for chest pain. 01/12/24   Revankar, Jennifer SAUNDERS, MD  omeprazole  (PRILOSEC) 40 MG capsule Take 1 capsule (40 mg total) by mouth 2 (two) times daily. 08/03/22   Cirigliano, Vito V, DO  omeprazole  (PRILOSEC) 40 MG capsule Take 1 capsule (40 mg total) by mouth 2 (two) times daily. 12/08/23   Esterwood, Amy S, PA-C  ondansetron  (ZOFRAN ) 4 MG tablet Take 1 tablet (4 mg total) by mouth every 8 (eight) hours as needed for nausea or vomiting. 08/23/23   Johnny Garnette LABOR, MD  predniSONE  (DELTASONE ) 10 MG tablet Take 4 tabs for 5 days, then 3 tabs for 5 days, then 2 tabs daily for 10 days.Then STOP 11/29/24   Geronimo Amel, MD  tadalafil  (CIALIS ) 20 MG tablet Take 1 tablet (20 mg total) by mouth daily as needed. 04/01/23   Johnny Garnette LABOR, MD  testosterone  enanthate (DELATESTRYL) 200 MG/ML injection SMARTSIG:0.5 Milliliter(s) IM Once a Week 08/25/22   [provider]    Allergies: Armoracia rusticana ext (horseradish), Other, Morphine and codeine, Trimethoprim, Rocephin  [ceftriaxone  sodium in dextrose ], and Sulfamethoxazole    Review of Systems  Constitutional:  Negative for chills and fever.  HENT:  Negative for congestion.   Eyes:  Negative for visual disturbance.  Respiratory:  Negative for shortness of breath.   Cardiovascular:  Negative for  chest pain.  Gastrointestinal:  Positive for abdominal pain and nausea. Negative for vomiting.  Genitourinary:  Negative for dysuria and flank pain.  Musculoskeletal:  Negative for back pain, neck pain and neck stiffness.  Skin:  Negative for rash.  Neurological:  Negative for light-headedness and headaches.    Updated Vital Signs BP (!) 133/97   Pulse 76   Temp 98.2 F (36.8 C) (Oral)   Resp 19   SpO2 100%   Physical Exam Vitals and nursing note reviewed.  Constitutional:      General: He is not in acute distress.    Appearance: He is well-developed.  HENT:     Head: Normocephalic and atraumatic.     Mouth/Throat:     Mouth: Mucous membranes are moist.  Eyes:     General:        Right eye: No discharge.        Left eye: No discharge.     Conjunctiva/sclera: Conjunctivae normal.  Neck:     Trachea: No tracheal deviation.  Cardiovascular:     Rate and Rhythm: Normal rate and regular rhythm.     Heart sounds: No murmur heard. Pulmonary:     Effort: Pulmonary effort is normal.     Breath sounds: Normal breath sounds.  Abdominal:     General: There is no distension.     Palpations: Abdomen is soft.     Tenderness: There is generalized abdominal tenderness and tenderness in the left lower quadrant. There is no guarding.  Musculoskeletal:     Cervical back: Normal range of motion and neck supple. No rigidity.  Skin:    General: Skin is warm.     Capillary Refill: Capillary refill takes less than 2 seconds.     Findings: No rash.  Neurological:     General: No focal deficit present.     Mental Status: He is alert.     Cranial Nerves: No cranial nerve deficit.  Psychiatric:        Mood and Affect: Mood normal.     (all labs ordered are listed, but only abnormal results are displayed) Labs Reviewed  COMPREHENSIVE METABOLIC PANEL WITH GFR - Abnormal; Notable for the following components:      Result Value   Glucose, Bld 106 (*)    Total Protein 8.7 (*)    All  other components within normal limits  CBC - Abnormal; Notable for the following components:   Hemoglobin 17.3 (*)    HCT 52.2 (*)    MCV 100.8 (*)    RDW 15.8 (*)    All other components within normal limits  URINALYSIS, ROUTINE W REFLEX MICROSCOPIC - Abnormal; Notable for the following components:   Hgb urine dipstick TRACE (*)    Protein, ur 30 (*)    All other components within normal limits  URINALYSIS, MICROSCOPIC (REFLEX) - Abnormal; Notable for  the following components:   Bacteria, UA RARE (*)    All other components within normal limits  RESP PANEL BY RT-PCR (RSV, FLU A&B, COVID)  RVPGX2  GASTROINTESTINAL PANEL BY PCR, STOOL (REPLACES STOOL CULTURE)  LIPASE, BLOOD    EKG: None  Radiology: CT ABDOMEN PELVIS W CONTRAST Result Date: 12/18/2024 EXAM: CT ABDOMEN AND PELVIS WITH CONTRAST 12/18/2024 11:47:05 AM TECHNIQUE: CT of the abdomen and pelvis was performed with the administration of 100 mL of iohexol  (OMNIPAQUE ) 300 MG/ML solution. Multiplanar reformatted images are provided for review. Automated exposure control, iterative reconstruction, and/or weight-based adjustment of the mA/kV was utilized to reduce the radiation dose to as low as reasonably achievable. COMPARISON: 11/09/2018 CLINICAL HISTORY: abd pain central and LLQ Abdominal pain central and left lower quadrant. FINDINGS: LOWER CHEST: Signs of chronic interstitial lung disease are identified within the lung bases with mild cylindrical bronchiectasis and bronchiolectasis, peripheral and subpleural reticulation, and honeycombing. LIVER: The liver is unremarkable. GALLBLADDER AND BILE DUCTS: Gallbladder is unremarkable. No biliary ductal dilatation. SPLEEN: The spleen is within normal limits in size and appearance. PANCREAS: Pancreas is normal in size and contour without focal lesion or ductal dilatation. ADRENAL GLANDS: Normal size and morphology bilaterally. No nodule, thickening, or hemorrhage. No periadrenal stranding.  KIDNEYS, URETERS AND BLADDER: There is a solitary left kidney. Persistent and progressive left hydronephrosis. The left renal pelvis measures 3.2 cm on today's study. Left-sided hydroureter is again noted to the level of the UVJ without signs of obstructing calcification. No discrete urothelial lesion identified. No perinephric or periureteral stranding. Urinary bladder is grossly unremarkable. GI AND BOWEL: The stomach is normal. Within the right hemiabdomen there are several loops of small bowel which are prominent, measuring up to 2.6 cm. Coronal image 27/8. Several air-fluid levels are noted associated with the small bowel loops. No pathologic dilatation of the large bowel loops. The appendix is visualized and contains a small appendicolith. No appendiceal dilatation or periappendiceal inflammatory change. Mild diffuse colonic stool burden. Colonic diverticulosis without signs of acute diverticulitis. PERITONEUM AND RETROPERITONEUM: No ascites or focal fluid collections. No signs of free air. VASCULATURE: Aorta is normal in caliber. LYMPH NODES: No signs of abdominal or pelvic adenopathy. REPRODUCTIVE ORGANS: The prostate gland appears normal. BONES AND SOFT TISSUES: Dorsal column stimulator and baclofen pump are identified. Signs of bilateral avascular necrosis identified. No acute osseous abnormality. No focal soft tissue abnormality. IMPRESSION: 1. Prominent small bowel loops in the right hemiabdomen with air-fluid levels, most consistent with early or partial small bowel obstruction; differential includes ileus or focal enteritis. 2. Persistent and progressive left hydronephrosis and hydroureter to the level of the UVJ without obstructing calcification or discrete urothelial lesion, concerning for distal ureteral stricture or occult urothelial lesion, particularly in the setting of a solitary kidney. Recommend urology consultation and further evaluation, such as CT urogram and/or cystoscopy, as clinically  appropriate. Electronically signed by: Waddell Calk MD 12/18/2024 12:32 PM EST RP Workstation: [id number]     Procedures   Medications Ordered in the ED  sodium chloride  0.9 % bolus 500 mL (0 mLs Intravenous Stopped 12/18/24 1237)  HYDROmorphone  (DILAUDID ) injection 0.5 mg (0.5 mg Intravenous Given 12/18/24 1117)  iohexol  (OMNIPAQUE ) 300 MG/ML solution 100 mL (100 mLs Intravenous Contrast Given 12/18/24 1133)  HYDROmorphone  (DILAUDID ) injection 0.5 mg (0.5 mg Intravenous Given 12/18/24 1309)  Medical Decision Making Amount and/or Complexity of Data Reviewed Labs: ordered. Radiology: ordered.  Risk Prescription drug management.   Patient with history of lupus, reflux, hernia, coronary artery disease/bypass presents with worsening abdominal discomfort especially after eating or drinking.  Differential includes worsening acid reflux, colitis, diverticulitis, pancreatitis, other.  Patient says not currently on steroids or blood thinners.  Plan for IV fluids, blood work check for pancreatitis, liver function, kidney function and to check for signs of anemia.  CT abdomen pelvis ordered for further delineation of pain since this is different and previous history.  Patient comfortable plan.  Blood work independently reviewed overall reassuring normal kidney function, normal electrolytes, white blood cell count hemoglobin unremarkable.  Vital signs reassuring suffer mild high blood pressure.  Patient had mild worsening pain repeat Dilaudid  given.  Patient is having small bowel movements, and recall symptoms worsening since having pork.  CT abdomen pelvis results independent reviewed showing 2 concerns 1 being worsening hydronephrosis and hydroureter with no obvious obstructive cause on CT scan.  Discussed the case with Dr. Delia who spoke with surgeon and reviewed case, no signs of urine infection, normal kidney function they will followed up the patient  urgently in the office.  Patient comfortable this plan.  Discussed with general surgery for possible partial bowel obstruction Dr. Stevie who reviewed the images, no significant dilation.  No indication for emergent transfer given pain controlled, no vomiting and passing stool/bowel gas.  Patient stable for close outpatient follow-up for both of these concerns.  He is comfortable with plan and understand strict reasons to return.     Final diagnoses:  Acute abdominal pain  Hydronephrosis of left kidney  Enteritis    ED Discharge Orders     None          Tonia Chew, MD 12/18/24 1448  "

## 2024-12-18 NOTE — ED Notes (Signed)
 Pt is obtaining a stool sample per EDP request before he leaves.

## 2024-12-18 NOTE — ED Triage Notes (Signed)
 Generalized abd pain, loose stools, diaphoresis for 1 week.  Denies emesis, nausea

## 2024-12-18 NOTE — Discharge Instructions (Addendum)
 Call Dr. Creed office/ [facility] for earliest appointment, let them know we spoke to them on the phone from the ER. Follow-up stool culture result with your primary doctor in 3 days. If you have worsening abdominal pain, not passing stool or gas or start vomiting return to the emergency room or call general surgeon Dr. Roselynn office.  Use Tylenol  every 4 hours as needed for pain. You can try your reflux medication Pepcid  or Prilosec to see if that helps.  Follow-up closely with your physician if new concerns.

## 2024-12-18 NOTE — ED Notes (Signed)
"  Patient aware of need for urine sample.   "

## 2024-12-18 NOTE — ED Notes (Signed)
 Informed patient of how to obtain a stool sample. He will attempt soon.

## 2024-12-18 NOTE — ED Notes (Signed)
 Pt advised he is concerned for a parasite. He ate some pork ribs about 6 weeks ago and knew something didn't taste right. Had diarrhea immediately after and has had the bowel problems since then. No current bloody stool, but concerned for possible enteral pathogen introduction.

## 2024-12-18 NOTE — ED Notes (Signed)
 Pt has had abd pain with diarrhea for 5x days. Concerned for the cause. No bloody stools, no emesis. No acute distress at bedside, warm and dry.

## 2024-12-18 NOTE — ED Notes (Signed)
 D/c paperwork reviewed with pt, including follow up care.  All questions and/or concerns addressed at time of d/c.  No further needs expressed. . Pt verbalized understanding, Ambulatory with significant other to ED exit, NAD.

## 2024-12-19 ENCOUNTER — Ambulatory Visit: Admitting: Family Medicine

## 2024-12-19 LAB — URINE CULTURE: Culture: NO GROWTH

## 2024-12-21 NOTE — Telephone Encounter (Signed)
 [name], Patient is requesting that the pressure be decreased for his CPAP.  He has an appoitment with you on 2/4.  I have placed a DL at your desk.  Thank you.

## 2024-12-22 ENCOUNTER — Ambulatory Visit: Admitting: Family Medicine

## 2024-12-22 NOTE — Telephone Encounter (Signed)
 CPAP download shows good compliance and control.  Will adjust CPAP pressure for comfort.  Daily average pressure at 8 point centimeters H2O.  AHI is 1.4 will adjust CPAP pressure to CPAP AutoSet 5 to 10 cm H2O. Please let me know if this is comfortable.  We can always make further adjustments if needed. Thanks for keeping us  updated.  I hope this helps with the symptoms that you are experiencing.  Please reach out if you have further issues.

## 2024-12-26 ENCOUNTER — Telehealth: Admitting: Family Medicine

## 2024-12-26 ENCOUNTER — Encounter: Payer: Self-pay | Admitting: Family Medicine

## 2024-12-26 DIAGNOSIS — K5900 Constipation, unspecified: Secondary | ICD-10-CM

## 2024-12-26 DIAGNOSIS — F411 Generalized anxiety disorder: Secondary | ICD-10-CM

## 2024-12-26 DIAGNOSIS — G4700 Insomnia, unspecified: Secondary | ICD-10-CM

## 2024-12-26 DIAGNOSIS — F900 Attention-deficit hyperactivity disorder, predominantly inattentive type: Secondary | ICD-10-CM

## 2024-12-27 ENCOUNTER — Encounter

## 2024-12-27 ENCOUNTER — Encounter: Payer: Self-pay | Admitting: Family Medicine

## 2024-12-27 ENCOUNTER — Ambulatory Visit: Admitting: Primary Care

## 2024-12-28 ENCOUNTER — Telehealth: Payer: Self-pay

## 2024-12-28 NOTE — Telephone Encounter (Signed)
 Copied from CRM [id number]. Topic: Clinical - Medication Refill >> Dec 28, 2024  1:24 PM Alfonso HERO wrote: Medication: fluconazole  (DIFLUCAN ) 150 MG tablet ALPRAZolam  (XANAX ) 1 MG tablet amphetamine -dextroamphetamine  (ADDERALL) 30 MG tablet mirtazapine  (REMERON ) 15 MG tablet  (Patient is leaving town this evening and thought meds were going  to be sent to the pharmacy after his appt.)  Has the patient contacted their pharmacy? Yes (Agent: If no, request that the patient contact the pharmacy for the refill. If patient does not wish to contact the pharmacy document the reason why and proceed with request.) (Agent: If yes, when and what did the pharmacy advise?)  This is the patient's preferred pharmacy:  [facility] [address] [place], [place] - [address] [facility] [address] [place] [address] Phone: [phone] Fax: [phone]   Is this the correct pharmacy for this prescription? Yes If no, delete pharmacy and type the correct one.   Has the prescription been filled recently? Yes  Is the patient out of the medication? Yes  Has the patient been seen for an appointment in the last year OR does the patient have an upcoming appointment? Yes  Can we respond through MyChart? Yes  Agent: Please be advised that Rx refills may take up to 3 business days. We ask that you follow-up with your pharmacy.

## 2024-12-28 NOTE — Telephone Encounter (Signed)
 Agent will let pt know md not in the office and will return tomorrow

## 2024-12-29 MED ORDER — AMPHETAMINE-DEXTROAMPHETAMINE 30 MG PO TABS: 30.0000 mg | ORAL_TABLET | Freq: Two times a day (BID) | ORAL | 0 refills | Status: AC

## 2024-12-29 MED ORDER — FLUCONAZOLE 150 MG PO TABS: 150.0000 mg | ORAL_TABLET | ORAL | 11 refills | Status: AC | PRN

## 2024-12-29 MED ORDER — MIRTAZAPINE 15 MG PO TABS: 15.0000 mg | ORAL_TABLET | Freq: Every day | ORAL | 5 refills | Status: AC

## 2024-12-29 NOTE — Telephone Encounter (Signed)
 Done (the Xanax  already has refills until April)
